# Patient Record
Sex: Female | Born: 1965 | Race: Black or African American | Hispanic: No | Marital: Single | Smoking: Never smoker
Health system: Southern US, Community
[De-identification: ages and names within clinical notes are randomized; demographics above are authoritative.]

## PROBLEM LIST (undated history)

## (undated) DIAGNOSIS — R519 Headache, unspecified: Secondary | ICD-10-CM

## (undated) DIAGNOSIS — I10 Essential (primary) hypertension: Secondary | ICD-10-CM

## (undated) DIAGNOSIS — M1990 Unspecified osteoarthritis, unspecified site: Secondary | ICD-10-CM

## (undated) DIAGNOSIS — J45909 Unspecified asthma, uncomplicated: Secondary | ICD-10-CM

## (undated) DIAGNOSIS — G039 Meningitis, unspecified: Secondary | ICD-10-CM

## (undated) DIAGNOSIS — D649 Anemia, unspecified: Secondary | ICD-10-CM

## (undated) DIAGNOSIS — R7303 Prediabetes: Secondary | ICD-10-CM

## (undated) DIAGNOSIS — A419 Sepsis, unspecified organism: Secondary | ICD-10-CM

## (undated) DIAGNOSIS — R51 Headache: Secondary | ICD-10-CM

## (undated) DIAGNOSIS — G4730 Sleep apnea, unspecified: Secondary | ICD-10-CM

## (undated) DIAGNOSIS — T7840XA Allergy, unspecified, initial encounter: Secondary | ICD-10-CM

## (undated) DIAGNOSIS — IMO0002 Reserved for concepts with insufficient information to code with codable children: Secondary | ICD-10-CM

## (undated) DIAGNOSIS — K602 Anal fissure, unspecified: Secondary | ICD-10-CM

## (undated) DIAGNOSIS — G4710 Hypersomnia, unspecified: Secondary | ICD-10-CM

## (undated) HISTORY — DX: Anal fissure, unspecified: K60.2

## (undated) HISTORY — PX: ABLATION: SHX5711

## (undated) HISTORY — PX: DILATION AND CURETTAGE OF UTERUS: SHX78

## (undated) HISTORY — DX: Meningitis, unspecified: G03.9

## (undated) HISTORY — DX: Unspecified asthma, uncomplicated: J45.909

## (undated) HISTORY — DX: Sepsis, unspecified organism: A41.9

## (undated) HISTORY — DX: Hypersomnia, unspecified: G47.10

## (undated) HISTORY — DX: Unspecified osteoarthritis, unspecified site: M19.90

## (undated) HISTORY — DX: Essential (primary) hypertension: I10

## (undated) HISTORY — PX: TUBAL LIGATION: SHX77

## (undated) HISTORY — DX: Reserved for concepts with insufficient information to code with codable children: IMO0002

## (undated) HISTORY — DX: Anemia, unspecified: D64.9

## (undated) HISTORY — DX: Headache: R51

## (undated) HISTORY — DX: Allergy, unspecified, initial encounter: T78.40XA

## (undated) HISTORY — DX: Headache, unspecified: R51.9

---

## 2010-12-25 ENCOUNTER — Emergency Department (HOSPITAL_BASED_OUTPATIENT_CLINIC_OR_DEPARTMENT_OTHER)
Admission: EM | Admit: 2010-12-25 | Discharge: 2010-12-25 | Disposition: A | Discharge status: Home / Self Care | Payer: BC Managed Care – PPO | Attending: Emergency Medicine | Admitting: Emergency Medicine

## 2010-12-25 DIAGNOSIS — J40 Bronchitis, not specified as acute or chronic: Secondary | ICD-10-CM | POA: Insufficient documentation

## 2010-12-25 DIAGNOSIS — R059 Cough, unspecified: Secondary | ICD-10-CM | POA: Insufficient documentation

## 2010-12-25 DIAGNOSIS — J45909 Unspecified asthma, uncomplicated: Secondary | ICD-10-CM | POA: Insufficient documentation

## 2010-12-25 DIAGNOSIS — R05 Cough: Secondary | ICD-10-CM | POA: Insufficient documentation

## 2011-11-22 ENCOUNTER — Telehealth: Payer: Self-pay

## 2011-11-22 ENCOUNTER — Ambulatory Visit (INDEPENDENT_AMBULATORY_CARE_PROVIDER_SITE_OTHER): Payer: BC Managed Care – PPO | Admitting: Internal Medicine

## 2011-11-22 VITALS — BP 132/84 | HR 84 | Temp 98.1°F | Resp 16 | Ht 60.5 in | Wt 137.6 lb

## 2011-11-22 DIAGNOSIS — D649 Anemia, unspecified: Secondary | ICD-10-CM

## 2011-11-22 LAB — POCT CBC
Granulocyte percent: 66.8 %G (ref 37–80)
MCV: 81.5 fL (ref 80–97)
MID (cbc): 0.3 (ref 0–0.9)
POC Granulocyte: 4.3 (ref 2–6.9)
POC LYMPH PERCENT: 28.1 %L (ref 10–50)
Platelet Count, POC: 251 10*3/uL (ref 142–424)
RDW, POC: 17.4 %

## 2011-11-22 NOTE — Progress Notes (Signed)
Lauren Paul is here for a followup of her iron deficiency anemia. She currently has no symptoms. Her last physical examination was January of 2012 at which time she was found to be healthy.  Review of systems reveals no new complaints. Physical examination shows normal vital signs with no systems to be checked.  Problem #1 iron deficiency anemia  The patient has to leave before her lab work is ready and so we will call of her lab work when the iron studies are returned.

## 2011-11-23 ENCOUNTER — Encounter: Payer: Self-pay | Admitting: Internal Medicine

## 2011-11-23 LAB — IRON AND TIBC
Iron: 39 ug/dL — ABNORMAL LOW (ref 42–145)
UIBC: 351 ug/dL (ref 125–400)

## 2011-11-24 NOTE — Telephone Encounter (Signed)
Pt. Called wanting lab results from 11/22/11. Call back # [phone]

## 2011-11-24 NOTE — Telephone Encounter (Signed)
Called patient back and left message... Dr. Merla Riches wrote letter with lab results attached.   Mailed to pt today with recommendations.  Left this detailed msg for pt.  CB if questions.

## 2011-11-25 ENCOUNTER — Encounter: Payer: Self-pay | Admitting: Internal Medicine

## 2011-11-25 ENCOUNTER — Telehealth: Payer: Self-pay

## 2011-11-25 NOTE — Telephone Encounter (Signed)
Pt called because she had gotten VM about labs sent, but she wanted to get results now. Looked up results and letter written by Dr Merla Riches and explained his instructions to pt.

## 2013-05-08 ENCOUNTER — Ambulatory Visit: Payer: BC Managed Care – PPO | Admitting: Family Medicine

## 2013-05-08 VITALS — BP 118/72 | HR 60 | Temp 98.0°F | Resp 16 | Ht 60.05 in | Wt 147.0 lb

## 2013-05-08 DIAGNOSIS — R319 Hematuria, unspecified: Secondary | ICD-10-CM

## 2013-05-08 LAB — POCT UA - MICROSCOPIC ONLY
Crystals, Ur, HPF, POC: NEGATIVE
Epithelial cells, urine per micros: NEGATIVE
Mucus, UA: NEGATIVE

## 2013-05-08 LAB — POCT URINALYSIS DIPSTICK
Ketones, UA: NEGATIVE
Nitrite, UA: POSITIVE
Spec Grav, UA: <=1.005
Urobilinogen, UA: 2
pH, UA: 5

## 2013-05-08 MED ORDER — CIPROFLOXACIN HCL 500 MG PO TABS: 500.0000 mg | ORAL_TABLET | Freq: Two times a day (BID) | ORAL | Status: DC

## 2013-05-08 NOTE — Patient Instructions (Signed)
Urinary Tract Infection  Urinary tract infections (UTIs) can develop anywhere along your urinary tract. Your urinary tract is your body's drainage system for removing wastes and extra water. Your urinary tract includes two kidneys, two ureters, a bladder, and a urethra. Your kidneys are a pair of bean-shaped organs. Each kidney is about the size of your fist. They are located below your ribs, one on each side of your spine.  CAUSES  Infections are caused by microbes, which are microscopic organisms, including fungi, viruses, and bacteria. These organisms are so small that they can only be seen through a microscope. Bacteria are the microbes that most commonly cause UTIs.  SYMPTOMS   Symptoms of UTIs may vary by age and gender of the patient and by the location of the infection. Symptoms in young women typically include a frequent and intense urge to urinate and a painful, burning feeling in the bladder or urethra during urination. Older women and men are more likely to be tired, shaky, and weak and have muscle aches and abdominal pain. A fever may mean the infection is in your kidneys. Other symptoms of a kidney infection include pain in your back or sides below the ribs, nausea, and vomiting.  DIAGNOSIS  To diagnose a UTI, your caregiver will ask you about your symptoms. Your caregiver also will ask to provide a urine sample. The urine sample will be tested for bacteria and white blood cells. White blood cells are made by your body to help fight infection.  TREATMENT   Typically, UTIs can be treated with medication. Because most UTIs are caused by a bacterial infection, they usually can be treated with the use of antibiotics. The choice of antibiotic and length of treatment depend on your symptoms and the type of bacteria causing your infection.  HOME CARE INSTRUCTIONS   If you were prescribed antibiotics, take them exactly as your caregiver instructs you. Finish the medication even if you feel better after you  have only taken some of the medication.   Drink enough water and fluids to keep your urine clear or pale yellow.   Avoid caffeine, tea, and carbonated beverages. They tend to irritate your bladder.   Empty your bladder often. Avoid holding urine for long periods of time.   Empty your bladder before and after sexual intercourse.   After a bowel movement, women should cleanse from front to back. Use each tissue only once.  SEEK MEDICAL CARE IF:    You have back pain.   You develop a fever.   Your symptoms do not begin to resolve within 3 days.  SEEK IMMEDIATE MEDICAL CARE IF:    You have severe back pain or lower abdominal pain.   You develop chills.   You have nausea or vomiting.   You have continued burning or discomfort with urination.  MAKE SURE YOU:    Understand these instructions.   Will watch your condition.   Will get help right away if you are not doing well or get worse.  Document Released: 07/17/2005 Document Revised: 04/07/2012 Document Reviewed: 11/15/2011  Exit[employer] Patient Information 2014 [employer].

## 2013-05-08 NOTE — Progress Notes (Signed)
    Results for orders placed in visit on 05/08/13  POCT UA - MICROSCOPIC ONLY      Result Value Range   WBC, Ur, HPF, POC 3-5     RBC, urine, microscopic 1-3     Bacteria, U Microscopic 1+     Mucus, UA neg     Epithelial cells, urine per micros neg     Crystals, Ur, HPF, POC neg     Casts, Ur, LPF, POC neg     Yeast, UA neg    POCT URINALYSIS DIPSTICK      Result Value Range   Color, UA red     Clarity, UA clear     Glucose, UA 100     Bilirubin, UA neg     Ketones, UA neg     Spec Grav, UA <=1.005     Blood, UA large     pH, UA 5.0     Protein, UA 100     Urobilinogen, UA 2.0     Nitrite, UA pos     Leukocytes, UA large (3+)

## 2013-05-11 LAB — URINE CULTURE: Colony Count: >=100000

## 2013-05-17 ENCOUNTER — Ambulatory Visit: Payer: BC Managed Care – PPO | Admitting: Emergency Medicine

## 2013-05-17 VITALS — BP 128/88 | HR 63 | Temp 98.3°F | Resp 18 | Ht 60.0 in | Wt 144.2 lb

## 2013-05-17 DIAGNOSIS — B029 Zoster without complications: Secondary | ICD-10-CM

## 2013-05-17 DIAGNOSIS — R109 Unspecified abdominal pain: Secondary | ICD-10-CM

## 2013-05-17 DIAGNOSIS — C189 Malignant neoplasm of colon, unspecified: Secondary | ICD-10-CM

## 2013-05-17 LAB — POCT URINALYSIS DIPSTICK
Bilirubin, UA: NEGATIVE
Blood, UA: NEGATIVE
Glucose, UA: NEGATIVE
Ketones, UA: NEGATIVE
Leukocytes, UA: NEGATIVE
Nitrite, UA: NEGATIVE
Protein, UA: NEGATIVE
Spec Grav, UA: 1.025
Urobilinogen, UA: 0.2
pH, UA: 7

## 2013-05-17 LAB — POCT UA - MICROSCOPIC ONLY
Casts, Ur, LPF, POC: NEGATIVE
Crystals, Ur, HPF, POC: NEGATIVE
Mucus, UA: NEGATIVE
Yeast, UA: NEGATIVE

## 2013-05-17 MED ORDER — VALACYCLOVIR HCL 1 G PO TABS: 1000.0000 mg | ORAL_TABLET | Freq: Two times a day (BID) | ORAL | Status: DC

## 2013-05-17 MED ORDER — HYDROCODONE-ACETAMINOPHEN 5-325 MG PO TABS: 1.0000 | ORAL_TABLET | ORAL | Status: DC | PRN

## 2013-05-17 NOTE — Patient Instructions (Addendum)
Shingles Shingles (herpes zoster) is an infection that is caused by the same virus that causes chickenpox (varicella). The infection causes a painful skin rash and fluid-filled blisters, which eventually break open, crust over, and heal. It may occur in any area of the body, but it usually affects only one side of the body or face. The pain of shingles usually lasts about 1 month. However, some people with shingles may develop long-term (chronic) pain in the affected area of the body. Shingles often occurs many years after the person had chickenpox. It is more common:  In people older than 50 years.  In people with weakened immune systems, such as those with HIV, AIDS, or cancer.  In people taking medicines that weaken the immune system, such as transplant medicines.  In people under great stress. CAUSES  Shingles is caused by the varicella zoster virus (VZV), which also causes chickenpox. After a person is infected with the virus, it can remain in the person's body for years in an inactive state (dormant). To cause shingles, the virus reactivates and breaks out as an infection in a nerve root. The virus can be spread from person to person (contagious) through contact with open blisters of the shingles rash. It will only spread to people who have not had chickenpox. When these people are exposed to the virus, they may develop chickenpox. They will not develop shingles. Once the blisters scab over, the person is no longer contagious and cannot spread the virus to others. SYMPTOMS  Shingles shows up in stages. The initial symptoms may be pain, itching, and tingling in an area of the skin. This pain is usually described as burning, stabbing, or throbbing.In a few days or weeks, a painful red rash will appear in the area where the pain, itching, and tingling were felt. The rash is usually on one side of the body in a band or belt-like pattern. Then, the rash usually turns into fluid-filled blisters. They  will scab over and dry up in approximately 2 3 weeks. Flu-like symptoms may also occur with the initial symptoms, the rash, or the blisters. These may include:  Fever.  Chills.  Headache.  Upset stomach. DIAGNOSIS  Your caregiver will perform a skin exam to diagnose shingles. Skin scrapings or fluid samples may also be taken from the blisters. This sample will be examined under a microscope or sent to a lab for further testing. TREATMENT  There is no specific cure for shingles. Your caregiver will likely prescribe medicines to help you manage the pain, recover faster, and avoid long-term problems. This may include antiviral drugs, anti-inflammatory drugs, and pain medicines. HOME CARE INSTRUCTIONS   Take a cool bath or apply cool compresses to the area of the rash or blisters as directed. This may help with the pain and itching.   Only take over-the-counter or prescription medicines as directed by your caregiver.   Rest as directed by your caregiver.  Keep your rash and blisters clean with mild soap and cool water or as directed by your caregiver.  Do not pick your blisters or scratch your rash. Apply an anti-itch cream or numbing creams to the affected area as directed by your caregiver.  Keep your shingles rash covered with a loose bandage (dressing).  Avoid skin contact with:  Babies.   Pregnant women.   Children with eczema.   Elderly people with transplants.   People with chronic illnesses, such as leukemia or AIDS.   Wear loose-fitting clothing to help ease   the pain of material rubbing against the rash.  Keep all follow-up appointments with your caregiver.If the area involved is on your face, you may receive a referral for follow-up to a specialist, such as an eye doctor (ophthalmologist) or an ear, nose, and throat (ENT) doctor. Keeping all follow-up appointments will help you avoid eye complications, chronic pain, or disability.  SEEK IMMEDIATE MEDICAL  CARE IF:   You have facial pain, pain around the eye area, or loss of feeling on one side of your face.  You have ear pain or ringing in your ear.  You have loss of taste.  Your pain is not relieved with prescribed medicines.   Your redness or swelling spreads.   You have more pain and swelling.  Your condition is worsening or has changed.   You have a feveror persistent symptoms for more than 2 3 days.  You have a fever and your symptoms suddenly get worse. MAKE SURE YOU:  Understand these instructions.  Will watch your condition.  Will get help right away if you are not doing well or get worse. Document Released: 10/07/2005 Document Revised: 07/01/2012 Document Reviewed: 05/21/2012 [employer] Patient Information 2014 [employer].  

## 2013-05-17 NOTE — Progress Notes (Signed)
[facility] [address] [place] [address] [phone]  Date:  05/17/2013   Name:  Lauren Paul   DOB:  06-09-1966   MRN:  [id number]  PCP:  No primary provider on file.    Chief Complaint: Back Pain   History of Present Illness:  Lauren Paul is a 47 y.o. very pleasant female patient who presents with the following:  Patient was in the office on 7/19 with a UTI sensitive to cipro by culture.  She completed her course of antibiotic and now has persistent dysuria and right flank pain.  Constant dull aching pain.  No GYN or GI symptoms.  No fever but chilled feeling.  Ho blood in urine.  No improvement with over the counter medications or other home remedies. Denies other complaint or health concern today.   There are no active problems to display for this patient.   Past Medical History  Diagnosis Date  . Allergy   . Anemia   . Asthma   . Ulcer   . Meningitis     History reviewed. No pertinent past surgical history.  History  Substance Use Topics  . Smoking status: Never Smoker   . Smokeless tobacco: Not on file  . Alcohol Use: 1.0 oz/week    2 drink(s) per week    Family History  Problem Relation Age of Onset  . Cancer Mother 15    Bladder Cancer  . Cancer Father     Lung Cancer    Allergies  Allergen Reactions  . Benadryl (Diphenhydramine Hcl) Shortness Of Breath    Pt states she can take Childrens Benadryl dye free fine.  . Latex Swelling  . Penicillins Anaphylaxis    Pt states she can not take anything in the "cillin" family.  . Shellfish Allergy     Medication list has been reviewed and updated.  Current Outpatient Prescriptions on File Prior to Visit  Medication Sig Dispense Refill  . Multiple Vitamin (MULTIVITAMIN) capsule Take 1 capsule by mouth daily.      . ciprofloxacin (CIPRO) 500 MG tablet Take 1 tablet (500 mg total) by mouth 2 (two) times daily.  10 tablet  1  . escitalopram (LEXAPRO) 10 MG tablet Take 10 mg by  mouth daily.      . ferrous sulfate dried (SLOW FE) 160 (50 FE) MG TBCR Take 160 mg by mouth daily.       No current facility-administered medications on file prior to visit.    Review of Systems:  As per HPI, otherwise negative.    Physical Examination: Filed Vitals:   05/17/13 1501  BP: 128/88  Pulse: 63  Temp: 98.3 F (36.8 C)  Resp: 18   Filed Vitals:   05/17/13 1501  Height: 5' (1.524 m)  Weight: 144 lb 3.2 oz (65.409 kg)   Body mass index is 28.16 kg/(m^2). Ideal Body Weight: Weight in (lb) to have BMI = 25: 127.7  GEN: WDWN, NAD, Non-toxic, A & O x 3 HEENT: Atraumatic, Normocephalic. Neck supple. No masses, No LAD. Ears and Nose: No external deformity. CV: RRR, No M/G/R. No JVD. No thrill. No extra heart sounds. PULM: CTA B, no wheezes, crackles, rhonchi. No retractions. No resp. distress. No accessory muscle use. ABD: S, NT, ND, +BS. No rebound. No HSM. EXTR: No c/c/e NEURO Normal gait.  PSYCH: Normally interactive. Conversant. Not depressed or anxious appearing.  Calm demeanor.    Assessment and Plan: Back pain Shingles vicodin Valtrex  Signed,  Phillips Odor, MD   Results for orders placed in visit on 05/17/13  POCT URINALYSIS DIPSTICK      Result Value Range   Color, UA yellow     Clarity, UA cloudy     Glucose, UA neg     Bilirubin, UA neg     Ketones, UA neg     Spec Grav, UA 1.025     Blood, UA neg     pH, UA 7.0     Protein, UA neg     Urobilinogen, UA 0.2     Nitrite, UA neg     Leukocytes, UA Negative    POCT UA - MICROSCOPIC ONLY      Result Value Range   WBC, Ur, HPF, POC 0-1     RBC, urine, microscopic 0-1     Bacteria, U Microscopic trace     Mucus, UA neg     Epithelial cells, urine per micros 0-2     Crystals, Ur, HPF, POC neg     Casts, Ur, LPF, POC neg     Yeast, UA neg

## 2014-05-03 ENCOUNTER — Ambulatory Visit (INDEPENDENT_AMBULATORY_CARE_PROVIDER_SITE_OTHER): Payer: BC Managed Care – PPO | Admitting: Internal Medicine

## 2014-05-03 VITALS — BP 186/110 | HR 59 | Temp 98.0°F | Resp 18 | Ht 60.0 in | Wt 150.4 lb

## 2014-05-03 DIAGNOSIS — I10 Essential (primary) hypertension: Secondary | ICD-10-CM

## 2014-05-03 DIAGNOSIS — Z79899 Other long term (current) drug therapy: Secondary | ICD-10-CM

## 2014-05-03 LAB — POCT UA - MICROSCOPIC ONLY
CASTS, UR, LPF, POC: NEGATIVE
CRYSTALS, UR, HPF, POC: NEGATIVE
YEAST UA: NEGATIVE

## 2014-05-03 LAB — POCT URINALYSIS DIPSTICK
Bilirubin, UA: NEGATIVE
Blood, UA: NEGATIVE
Glucose, UA: NEGATIVE
KETONES UA: NEGATIVE
LEUKOCYTES UA: NEGATIVE
Nitrite, UA: NEGATIVE
PH UA: 7
PROTEIN UA: NEGATIVE
Spec Grav, UA: 1.015
UROBILINOGEN UA: 0.2

## 2014-05-03 MED ORDER — AMLODIPINE BESYLATE 10 MG PO TABS: 10.0000 mg | ORAL_TABLET | Freq: Every day | ORAL | Status: DC

## 2014-05-03 NOTE — Patient Instructions (Signed)
DASH Eating Plan DASH stands for "Dietary Approaches to Stop Hypertension." The DASH eating plan is a healthy eating plan that has been shown to reduce high blood pressure (hypertension). Additional health benefits may include reducing the risk of type 2 diabetes mellitus, heart disease, and stroke. The DASH eating plan may also help with weight loss. WHAT DO I NEED TO KNOW ABOUT THE DASH EATING PLAN? For the DASH eating plan, you will follow these general guidelines:  Choose foods with a percent daily value for sodium of less than 5% (as listed on the food label).  Use salt-free seasonings or herbs instead of table salt or sea salt.  Check with your health care provider or pharmacist before using salt substitutes.  Eat lower-sodium products, often labeled as "lower sodium" or "no salt added."  Eat fresh foods.  Eat more vegetables, fruits, and low-fat dairy products.  Choose whole grains. Look for the word "whole" as the first word in the ingredient list.  Choose fish and skinless chicken or turkey more often than red meat. Limit fish, poultry, and meat to 6 oz (170 g) each day.  Limit sweets, desserts, sugars, and sugary drinks.  Choose heart-healthy fats.  Limit cheese to 1 oz (28 g) per day.  Eat more home-cooked food and less restaurant, buffet, and fast food.  Limit fried foods.  Cook foods using methods other than frying.  Limit canned vegetables. If you do use them, rinse them well to decrease the sodium.  When eating at a restaurant, ask that your food be prepared with less salt, or no salt if possible. WHAT FOODS CAN I EAT? Seek help from a dietitian for individual calorie needs. Grains Whole grain or whole wheat bread. Brown rice. Whole grain or whole wheat pasta. Quinoa, bulgur, and whole grain cereals. Low-sodium cereals. Corn or whole wheat flour tortillas. Whole grain cornbread. Whole grain crackers. Low-sodium crackers. Vegetables Fresh or frozen vegetables  (raw, steamed, roasted, or grilled). Low-sodium or reduced-sodium tomato and vegetable juices. Low-sodium or reduced-sodium tomato sauce and paste. Low-sodium or reduced-sodium canned vegetables.  Fruits All fresh, canned (in natural juice), or frozen fruits. Meat and Other Protein Products Ground beef (85% or leaner), grass-fed beef, or beef trimmed of fat. Skinless chicken or turkey. Ground chicken or turkey. Pork trimmed of fat. All fish and seafood. Eggs. Dried beans, peas, or lentils. Unsalted nuts and seeds. Unsalted canned beans. Dairy Low-fat dairy products, such as skim or 1% milk, 2% or reduced-fat cheeses, low-fat ricotta or cottage cheese, or plain low-fat yogurt. Low-sodium or reduced-sodium cheeses. Fats and Oils Tub margarines without trans fats. Light or reduced-fat mayonnaise and salad dressings (reduced sodium). Avocado. Safflower, olive, or canola oils. Natural peanut or almond butter. Other Unsalted popcorn and pretzels. The items listed above may not be a complete list of recommended foods or beverages. Contact your dietitian for more options. WHAT FOODS ARE NOT RECOMMENDED? Grains White bread. White pasta. White rice. Refined cornbread. Bagels and croissants. Crackers that contain trans fat. Vegetables Creamed or fried vegetables. Vegetables in a cheese sauce. Regular canned vegetables. Regular canned tomato sauce and paste. Regular tomato and vegetable juices. Fruits Dried fruits. Canned fruit in light or heavy syrup. Fruit juice. Meat and Other Protein Products Fatty cuts of meat. Ribs, chicken wings, bacon, sausage, bologna, salami, chitterlings, fatback, hot dogs, bratwurst, and packaged luncheon meats. Salted nuts and seeds. Canned beans with salt. Dairy Whole or 2% milk, cream, half-and-half, and cream cheese. Whole-fat or sweetened yogurt. Full-fat   cheeses or blue cheese. Nondairy creamers and whipped toppings. Processed cheese, cheese spreads, or cheese  curds. Condiments Onion and garlic salt, seasoned salt, table salt, and sea salt. Canned and packaged gravies. Worcestershire sauce. Tartar sauce. Barbecue sauce. Teriyaki sauce. Soy sauce, including reduced sodium. Steak sauce. Fish sauce. Oyster sauce. Cocktail sauce. Horseradish. Ketchup and mustard. Meat flavorings and tenderizers. Bouillon cubes. Hot sauce. Tabasco sauce. Marinades. Taco seasonings. Relishes. Fats and Oils Butter, stick margarine, lard, shortening, ghee, and bacon fat. Coconut, palm kernel, or palm oils. Regular salad dressings. Other Pickles and olives. Salted popcorn and pretzels. The items listed above may not be a complete list of foods and beverages to avoid. Contact your dietitian for more information. WHERE CAN I FIND MORE INFORMATION? National Heart, Lung, and Blood Institute: travelstabloid.com Document Released: 09/26/2011 Document Revised: 10/12/2013 Document Reviewed: 08/11/2013 [facility] Patient Information 2015 [place], [place]. This information is not intended to replace advice given to you by your health care provider. Make sure you discuss any questions you have with your health care provider. Hypertension Hypertension, commonly called high blood pressure, is when the force of blood pumping through your arteries is too strong. Your arteries are the blood vessels that carry blood from your heart throughout your body. A blood pressure reading consists of a higher number over a lower number, such as 110/72. The higher number (systolic) is the pressure inside your arteries when your heart pumps. The lower number (diastolic) is the pressure inside your arteries when your heart relaxes. Ideally you want your blood pressure below 120/80. Hypertension forces your heart to work harder to pump blood. Your arteries may become narrow or stiff. Having hypertension puts you at risk for heart disease, stroke, and other problems.  RISK  FACTORS Some risk factors for high blood pressure are controllable. Others are not.  Risk factors you cannot control include:   Race. You may be at higher risk if you are African American.  Age. Risk increases with age.  Gender. Men are at higher risk than women before age 46 years. After age 78, women are at higher risk than men. Risk factors you can control include:  Not getting enough exercise or physical activity.  Being overweight.  Getting too much fat, sugar, calories, or salt in your diet.  Drinking too much alcohol. SIGNS AND SYMPTOMS Hypertension does not usually cause signs or symptoms. Extremely high blood pressure (hypertensive crisis) may cause headache, anxiety, shortness of breath, and nosebleed. DIAGNOSIS  To check if you have hypertension, your health care provider will measure your blood pressure while you are seated, with your arm held at the level of your heart. It should be measured at least twice using the same arm. Certain conditions can cause a difference in blood pressure between your right and left arms. A blood pressure reading that is higher than normal on one occasion does not mean that you need treatment. If one blood pressure reading is high, ask your health care provider about having it checked again. TREATMENT  Treating high blood pressure includes making lifestyle changes and possibly taking medication. Living a healthy lifestyle can help lower high blood pressure. You may need to change some of your habits. Lifestyle changes may include:  Following the DASH diet. This diet is high in fruits, vegetables, and whole grains. It is low in salt, red meat, and added sugars.  Getting at least 2 1/2 hours of brisk physical activity every week.  Losing weight if necessary.  Not smoking.  Limiting alcoholic beverages.  Learning ways to reduce stress. If lifestyle changes are not enough to get your blood pressure under control, your health care provider  may prescribe medicine. You may need to take more than one. Work closely with your health care provider to understand the risks and benefits. HOME CARE INSTRUCTIONS  Have your blood pressure rechecked as directed by your health care provider.   Only take medicine as directed by your health care provider. Follow the directions carefully. Blood pressure medicines must be taken as prescribed. The medicine does not work as well when you skip doses. Skipping doses also puts you at risk for problems.   Do not smoke.   Monitor your blood pressure at home as directed by your health care provider. SEEK MEDICAL CARE IF:   You think you are having a reaction to medicines taken.  You have recurrent headaches or feel dizzy.  You have swelling in your ankles.  You have trouble with your vision. SEEK IMMEDIATE MEDICAL CARE IF:  You develop a severe headache or confusion.  You have unusual weakness, numbness, or feel faint.  You have severe chest or abdominal pain.  You vomit repeatedly.  You have trouble breathing. MAKE SURE YOU:   Understand these instructions.  Will watch your condition.  Will get help right away if you are not doing well or get worse. Document Released: 10/07/2005 Document Revised: 10/12/2013 Document Reviewed: 07/30/2013 [facility] Patient Information 2015 [place], [place]. This information is not intended to replace advice given to you by your health care provider. Make sure you discuss any questions you have with your health care provider.

## 2014-05-03 NOTE — Progress Notes (Signed)
   Subjective:    Patient ID: Lauren Paul, female    DOB: 05/12/1966, 48 y.o.   MRN: [id number]  HPI    Review of Systems     Objective:   Physical Exam        Assessment & Plan:

## 2014-05-03 NOTE — Progress Notes (Signed)
   Subjective:    Patient ID: Lauren Paul, female    DOB: September 07, 1966, 48 y.o.   MRN: [id number]  HPI Patient here because she had an outpatient procedure 02/13/14 ( tubal ligation, DNC, and ablation )and got sepsis during procedure.  Patient was last seen by ID in May she believes.  ID doctor said the blood work was clear and it showed no infection at that time.  Patient because lethargic in the past few weeks.  Patient's left arm is still hurting from where she had a pic line in place for IV antibiotics.  Patient states she has headaches off and on for the past few weeks.  Pt states she has had elevated blood pressure since she was in hospital starting 02/19/14.  She doesn't recall ever having HTN problems in the past.  Patient states ED doctor did give her BP medication but pt did not take it because she didn't believe she needed it at that time.  She was in [facility] hospital and has no recoerds or reports with her.   Review of Systems     Objective:   Physical Exam  Vitals reviewed. Constitutional: She is oriented to person, place, and time. She appears well-developed and well-nourished. No distress.  HENT:  Head: Normocephalic.  Eyes: EOM are normal. Pupils are equal, round, and reactive to light.  Cardiovascular: Normal rate, regular rhythm and normal heart sounds.   No murmur heard. Pulmonary/Chest: Effort normal and breath sounds normal.  Musculoskeletal: Normal range of motion.  Neurological: She is alert and oriented to person, place, and time. She exhibits normal muscle tone. Coordination normal.  Psychiatric: She has a normal mood and affect. Her behavior is normal.     EKG normal labs Results for orders placed in visit on 05/03/14  POCT UA - MICROSCOPIC ONLY      Result Value Ref Range   WBC, Ur, HPF, POC 0-2     RBC, urine, microscopic 1-3     Bacteria, U Microscopic trace     Mucus, UA trace     Epithelial cells, urine per micros 2-4     Crystals, Ur, HPF, POC neg     Casts, Ur, LPF, POC neg     Yeast, UA neg    POCT URINALYSIS DIPSTICK      Result Value Ref Range   Color, UA yellow     Clarity, UA clear     Glucose, UA neg     Bilirubin, UA neg     Ketones, UA neg     Spec Grav, UA 1.015     Blood, UA neg     pH, UA 7.0     Protein, UA neg     Urobilinogen, UA 0.2     Nitrite, UA neg     Leukocytes, UA Negative         Assessment & Plan:  HTN untreated Sepsis post surgery in April 2015 followed by new HTN. Start Amlodipine 10mg  qd RTC 1 week/Consider add another med

## 2014-05-04 LAB — COMPREHENSIVE METABOLIC PANEL
ALBUMIN: 4.2 g/dL (ref 3.5–5.2)
ALK PHOS: 67 U/L (ref 39–117)
ALT: 13 U/L (ref 0–35)
AST: 20 U/L (ref 0–37)
BILIRUBIN TOTAL: 0.7 mg/dL (ref 0.2–1.2)
BUN: 11 mg/dL (ref 6–23)
CO2: 24 mEq/L (ref 19–32)
CREATININE: 0.73 mg/dL (ref 0.50–1.10)
Calcium: 9.2 mg/dL (ref 8.4–10.5)
Chloride: 104 mEq/L (ref 96–112)
Glucose, Bld: 81 mg/dL (ref 70–99)
Potassium: 3.7 mEq/L (ref 3.5–5.3)
SODIUM: 137 meq/L (ref 135–145)
Total Protein: 7.4 g/dL (ref 6.0–8.3)

## 2014-05-04 LAB — LIPID PANEL
CHOL/HDL RATIO: 2.5 ratio
CHOLESTEROL: 175 mg/dL (ref 0–200)
HDL: 69 mg/dL (ref >39)
LDL Cholesterol: 93 mg/dL (ref 0–99)
Triglycerides: 63 mg/dL (ref <150)
VLDL: 13 mg/dL (ref 0–40)

## 2014-05-04 LAB — CBC
HCT: 37.7 % (ref 36.0–46.0)
HEMOGLOBIN: 12.6 g/dL (ref 12.0–15.0)
MCH: 27.5 pg (ref 26.0–34.0)
MCHC: 33.4 g/dL (ref 30.0–36.0)
MCV: 82.3 fL (ref 78.0–100.0)
PLATELETS: 281 10*3/uL (ref 150–400)
RBC: 4.58 MIL/uL (ref 3.87–5.11)
RDW: 15.5 % (ref 11.5–15.5)
WBC: 6.9 10*3/uL (ref 4.0–10.5)

## 2014-05-04 LAB — TSH: TSH: 1.71 u[IU]/mL (ref 0.350–4.500)

## 2014-05-05 ENCOUNTER — Telehealth: Payer: Self-pay | Admitting: Radiology

## 2014-05-05 NOTE — Telephone Encounter (Signed)
Pt calling about labs but labs have not been reviewed yet. Please review so we can let pt know.

## 2014-05-06 ENCOUNTER — Encounter: Payer: Self-pay | Admitting: Radiology

## 2014-05-06 NOTE — Telephone Encounter (Signed)
All labs normal send copy

## 2014-05-07 NOTE — Telephone Encounter (Signed)
LMOM that labs were normal and a letter was sent.

## 2014-05-11 ENCOUNTER — Ambulatory Visit (INDEPENDENT_AMBULATORY_CARE_PROVIDER_SITE_OTHER): Payer: BC Managed Care – PPO | Admitting: Family Medicine

## 2014-05-11 ENCOUNTER — Encounter: Payer: Self-pay | Admitting: Family Medicine

## 2014-05-11 VITALS — BP 122/86 | HR 61 | Temp 97.7°F | Resp 16 | Ht 60.0 in | Wt 148.8 lb

## 2014-05-11 DIAGNOSIS — I10 Essential (primary) hypertension: Secondary | ICD-10-CM

## 2014-05-11 DIAGNOSIS — R5381 Other malaise: Secondary | ICD-10-CM

## 2014-05-11 DIAGNOSIS — A419 Sepsis, unspecified organism: Secondary | ICD-10-CM

## 2014-05-11 DIAGNOSIS — Z23 Encounter for immunization: Secondary | ICD-10-CM

## 2014-05-11 DIAGNOSIS — R5383 Other fatigue: Secondary | ICD-10-CM

## 2014-05-11 LAB — IRON: Iron: 64 ug/dL (ref 42–145)

## 2014-05-11 LAB — VITAMIN B12: Vitamin B-12: >2000 pg/mL — ABNORMAL HIGH (ref 211–911)

## 2014-05-11 NOTE — Progress Notes (Signed)
Subjective:  This chart was scribed for Reginia Forts, MD by Roxan Diesel, Scribe.  This patient was seen in [facility] and the patient's care was started at 8:27 AM.   Patient ID: Lauren Paul, female    DOB: 09/30/1966, 48 y.o.   MRN: [id number]  05/11/2014  Follow-up and Hypertension   HPI  HPI Comments: Lauren Paul is a 48 y.o. female former pt of Dr. Elder Cyphers who presents to [facility] for a 1 week f/u of HTN.  She was seen here on 7/17 with extremely elevated BP at 186/110.  Dr. Elder Cyphers started her on amlodipine 10mg  daily.  He also performed a CBC, CMET, lipid, TSH, and UA and all were normal.  She has been taking the amlodipine and has had readings of 116/73 at home.  She noticed blurred vision for a couple days after she began the medication but this has since resolved.  She denies dizziness, headache, leg swelling.  Pt initially found out that her BP was elevated when she was hospitalized for sepsis at [facility] in May.  She had otherwise had no prior issues with HTN and attributed it to her illness at that time.  Before this time it was normally under the 120s.  She was experiencing lethargy, low appetite, and feeling generally unwell since her discharge so she came here to get checked last week.  She continues to experience multiple symptoms which initially began after her hospitalization and which she attributes to her recent sepsis.  She states she continues to feel fatigued.  She complains of intermittent chest pain which usually comes on when sitting.  She also feels some occasional palpitations when she is sleeping or laying down.  She also repots SOB when going up stairs which is new for her, and muscle cramping in her legs. She had V-Tach while hospitalized and saw a cardiologist there who did an echo; it was felt that the V-tach was secondary to PICC line insertion into the heart.  She was advised to follow up with a cardiologist after discharge but she has not yet done  this.  She denies leg swelling.  She was having headaches but has had no further headaches since starting on the amlodipine.  Her mother is living and is 47 years old.  Mother has h/o bladder cancer and uterine cancer, s/p hysterectomy, but is otherwise well.  Father died at age 12 of lung cancer.  He had no other health problems.  Her sister is living and has HTN but is otherwise healthy.  Her brother is healthy.  Her aunt had brain cancer and lung cancer and died last year.  She has no family h/o colon cancer.   She is single and is not dating.  She does not have children.  She lives alone with a cat.  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  She is taking Lexapro for her menopausal symptoms, which works if she takes it along with [employer].  She is not on iron.  She has been on Lexapro for a year and feels it is causing her to gain weight.  She has never smoked.  She drinks about 2 glasses of alcohol per week.  She has not been exercising much lately due to her fatigue.  She is taking vitamin D3.  She has also been taking a multivitamin and B12 for years.  OB/GYN is a PA at [employer].  She had pap smear this year  and mammogram last year.  She has not had a colonoscopy.  Tetanus was over 10 years ago.  She does not get flu shots.   Review of Systems  Constitutional: Positive for fatigue. Negative for fever, chills, diaphoresis and unexpected weight change.  Eyes: Positive for visual disturbance (resolved).  Respiratory: Positive for shortness of breath. Negative for cough, wheezing and stridor.   Cardiovascular: Positive for chest pain and palpitations. Negative for leg swelling.  Gastrointestinal: Negative for nausea, vomiting, abdominal pain, diarrhea and constipation.  Musculoskeletal: Positive for myalgias.  Neurological: Negative for dizziness, tremors, seizures, syncope, facial asymmetry, speech difficulty, weakness, light-headedness, numbness and  headaches (not since last visit).     Past Medical History  Diagnosis Date  . Allergy   . Anemia   . Asthma   . Ulcer   . Meningitis   . Sepsis   . Hypertension    Past Surgical History  Procedure Laterality Date  . Ablation    . Dilation and curettage of uterus    . Tubes tied    . Tubal ligation     Allergies  Allergen Reactions  . Benadryl [Diphenhydramine Hcl] Shortness Of Breath    Pt states she can take Childrens Benadryl dye free fine.  . Latex Swelling  . Penicillins Anaphylaxis    Pt states she can not take anything in the "cillin" family.  . Shellfish Allergy    Current Outpatient Prescriptions  Medication Sig Dispense Refill  . amLODipine (NORVASC) 10 MG tablet Take 1 tablet (10 mg total) by mouth daily.  90 tablet  3  . escitalopram (LEXAPRO) 10 MG tablet Take 10 mg by mouth daily.      . Multiple Vitamin (MULTIVITAMIN) capsule Take 1 capsule by mouth daily.      Marland Kitchen OVER THE COUNTER MEDICATION daily.       No current facility-administered medications for this visit.   History   Social History  . Marital Status: Single    Spouse Name: N/A    Number of Children: N/A  . Years of Education: N/A   Occupational History  . Not on file.   Social History Main Topics  . Smoking status: Never Smoker   . Smokeless tobacco: Not on file  . Alcohol Use: 1.0 oz/week    2 drink(s) per week  . Drug Use: Not on file  . Sexual Activity: Not on file   Other Topics Concern  . Not on file   Social History Narrative   Marital status:  Single; not dating.      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      Tobacco:  None      Alcohol:  Weekends; 2 glasses of wine weekly      Exercise:  Sporadic.    Family History  Problem Relation Age of Onset  . Cancer Mother 35    Bladder Cancer; Uterine cancer  . Cancer Father     Lung Cancer  . Hypertension Sister           Objective:    BP 122/86  Pulse 61  Temp(Src) 97.7 F (36.5 C) (Oral)  Resp 16  Ht 5' (1.524 m)  Wt 148 lb 12.8 oz (67.495 kg)  BMI 29.06 kg/m2  SpO2 100%  Physical Exam  Nursing note and vitals reviewed. Constitutional: She is oriented to person, place,  and time. She appears well-developed and well-nourished. No distress.  HENT:  Head: Normocephalic and atraumatic.  Right Ear: Tympanic membrane and external ear normal.  Left Ear: Tympanic membrane and external ear normal.  Nose: Nose normal.  Mouth/Throat: Oropharynx is clear and moist. No oropharyngeal exudate.  Eyes: Conjunctivae and EOM are normal. Pupils are equal, round, and reactive to light.  Neck: Normal range of motion. Neck supple. Carotid bruit is not present. No tracheal deviation present. No thyromegaly present.  Cardiovascular: Normal rate, regular rhythm, normal heart sounds and intact distal pulses.  Exam reveals no gallop and no friction rub.   No murmur heard. Pulmonary/Chest: Effort normal and breath sounds normal. No respiratory distress. She has no wheezes. She has no rales.  Abdominal: Soft. Bowel sounds are normal. She exhibits no distension and no mass. There is no tenderness. There is no rebound and no guarding.  Musculoskeletal: Normal range of motion.  Lymphadenopathy:    She has no cervical adenopathy.  Neurological: She is alert and oriented to person, place, and time. No cranial nerve deficit.  Skin: Skin is warm and dry. No rash noted. She is not diaphoretic. No erythema. No pallor.  Psychiatric: She has a normal mood and affect. Her behavior is normal.   Results for orders placed in visit on 05/11/14  VITAMIN B12      Result Value Ref Range   Vitamin B-12 >2000 (*) 211 - 911 pg/mL  VITAMIN D 25 HYDROXY      Result Value Ref Range   Vit D, 25-Hydroxy 62  30 - 89 ng/mL  IRON      Result Value Ref Range   Iron 64  42 - 145 ug/dL   S/p TDAP in office.    Assessment & Plan:   1. Other  malaise and fatigue   2. Essential hypertension, benign   3. Sepsis, due to unspecified organism   4. Need for Tdap vaccination    1.  Malaise and fatigue: persistent; recent CBC, CMET, TSH, u/a all normal; obtain Vitamin B12, D, iron levels and normal.  Likely secondary to recent sepsis and hospitalization. Pt advised to contact cardiology for follow-up appointment to rule out cardiac cause of fatigue.  If persists, consider sleep study. 2.  HTN: improved control; no change in therapy at this time; follow-up in one month.   3.  Sepsis: resolved; obtain records; onset after D&C with ablation for DUB.   4. S/p TDAP in office.  Meds ordered this encounter  Medications  . OVER THE COUNTER MEDICATION    Sig: daily.    Return in about 4 weeks (around 06/08/2014) for recheck high blood pressure, fatigue.    I personally performed the services described in this documentation, which was scribed in my presence.  The recorded information has been reviewed and is accurate.  Reginia Forts, M.D.  [facility] [address] [place], [place]  [address] [phone] phone [phone] fax

## 2014-05-12 LAB — VITAMIN D 25 HYDROXY (VIT D DEFICIENCY, FRACTURES): Vit D, 25-Hydroxy: 62 ng/mL (ref 30–89)

## 2014-06-01 ENCOUNTER — Ambulatory Visit: Payer: BC Managed Care – PPO | Admitting: Family Medicine

## 2014-06-06 ENCOUNTER — Ambulatory Visit (INDEPENDENT_AMBULATORY_CARE_PROVIDER_SITE_OTHER): Payer: BC Managed Care – PPO | Admitting: Family Medicine

## 2014-06-06 ENCOUNTER — Encounter: Payer: Self-pay | Admitting: Family Medicine

## 2014-06-06 VITALS — BP 112/76 | HR 73 | Temp 98.2°F | Resp 16 | Ht 60.5 in | Wt 152.4 lb

## 2014-06-06 DIAGNOSIS — G4710 Hypersomnia, unspecified: Secondary | ICD-10-CM

## 2014-06-06 DIAGNOSIS — R238 Other skin changes: Secondary | ICD-10-CM

## 2014-06-06 DIAGNOSIS — M2550 Pain in unspecified joint: Secondary | ICD-10-CM

## 2014-06-06 DIAGNOSIS — R233 Spontaneous ecchymoses: Secondary | ICD-10-CM

## 2014-06-06 DIAGNOSIS — M7989 Other specified soft tissue disorders: Secondary | ICD-10-CM

## 2014-06-06 DIAGNOSIS — I10 Essential (primary) hypertension: Secondary | ICD-10-CM

## 2014-06-06 MED ORDER — HYDROCHLOROTHIAZIDE 12.5 MG PO TABS: 12.5000 mg | ORAL_TABLET | Freq: Every day | ORAL | Status: DC

## 2014-06-06 NOTE — Patient Instructions (Signed)
1.  Decrease Amlodipine 1omg to 1/2 tablet daily. 2. Start Hydrochlorathiazide 12.5mg  one daily for swelling.  You can continue to take while legs are swollen. 3.  Check blood pressure every other day.

## 2014-06-06 NOTE — Progress Notes (Signed)
Subjective:    Patient ID: Lauren Paul, female    DOB: [birth year], 48 y.o.   MRN: [id number] This chart was scribed for Wardell Honour, MD by Rosary Lively, ED scribe. This patient was seen in room Room/bed 29 and the patient's care was started at 3:30 PM.   06/06/2014  Follow-up, Sinusitis and Hypertension   HPI HPI Comments:  Lauren Paul is a 48 y.o. female who presents to [facility] for a one month follow up for fatigue.  Pt reports that she is still fatigued. Pt reports that she is trying to exercise as much as possible. She reports that she has a pedometer, and does about 10,000 steps a day. Pt denies CP, or palpitations when walking. Pt does not believe she snores, however; she does not wake-up refreshed. Pt also believes that it may also be partly due to menopause. Pt denies SOB when she lies down.  She did not schedule a follow-up appointment with cardiology after last visit.  Pt reports that she is experiencing swelling, with associated symptoms of joint aches and bruising easily, onset 4 days ago. Pt reports that she checks her BP every other day. Pt denies nose bleed, bleeding from gums, or hematuria.  Review of Systems  Constitutional: Negative for fever, chills, diaphoresis and fatigue.  HENT: Negative for nosebleeds.   Eyes: Negative for visual disturbance.  Respiratory: Negative for cough and shortness of breath.   Cardiovascular: Positive for leg swelling. Negative for chest pain and palpitations.  Gastrointestinal: Negative for nausea, vomiting, abdominal pain, diarrhea and constipation.  Endocrine: Negative for cold intolerance, heat intolerance, polydipsia, polyphagia and polyuria.  Genitourinary: Negative for hematuria.  Musculoskeletal: Positive for arthralgias, joint swelling and myalgias.  Neurological: Negative for dizziness, tremors, seizures, syncope, facial asymmetry, speech difficulty, weakness, light-headedness, numbness and headaches.  Hematological:  Bruises/bleeds easily.    Past Medical History  Diagnosis Date  . Allergy   . Anemia   . Asthma   . Ulcer   . Meningitis   . Sepsis   . Hypertension    Past Surgical History  Procedure Laterality Date  . Ablation    . Dilation and curettage of uterus    . Tubes tied    . Tubal ligation     Allergies  Allergen Reactions  . Benadryl [Diphenhydramine Hcl] Shortness Of Breath    Pt states she can take Childrens Benadryl dye free fine.  . Latex Swelling  . Penicillins Anaphylaxis    Pt states she can not take anything in the "cillin" family.  . Shellfish Allergy    Current Outpatient Prescriptions  Medication Sig Dispense Refill  . amLODipine (NORVASC) 10 MG tablet Take 1 tablet (10 mg total) by mouth daily.  90 tablet  3  . escitalopram (LEXAPRO) 10 MG tablet Take 10 mg by mouth daily.      . Multiple Vitamin (MULTIVITAMIN) capsule Take 1 capsule by mouth daily.      Marland Kitchen OVER THE COUNTER MEDICATION daily.      . hydrochlorothiazide (HYDRODIURIL) 12.5 MG tablet Take 1 tablet (12.5 mg total) by mouth daily.  30 tablet  1   No current facility-administered medications for this visit.   History   Social History  . Marital Status: Single    Spouse Name: N/A    Number of Children: N/A  . Years of Education: N/A   Occupational History  . Not on file.   Social History Main Topics  . Smoking status: Never Smoker   .  Smokeless tobacco: Not on file  . Alcohol Use: 1.0 oz/week    2 drink(s) per week  . Drug Use: Not on file  . Sexual Activity: Not on file   Other Topics Concern  . Not on file   Social History Narrative   Marital status:  Single; not dating.      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      Tobacco:  None      Alcohol:  Weekends; 2 glasses of wine weekly      Exercise:  Sporadic.    Family History  Problem Relation Age of Onset  . Cancer Mother 23     Bladder Cancer; Uterine cancer  . Cancer Father     Lung Cancer  . Hypertension Sister        Objective:    BP 112/76  Pulse 73  Temp(Src) 98.2 F (36.8 C) (Oral)  Resp 16  Ht 5' 0.5" (1.537 m)  Wt 152 lb 6.4 oz (69.128 kg)  BMI 29.26 kg/m2  SpO2 98% Physical Exam  Nursing note and vitals reviewed. Constitutional: She is oriented to person, place, and time. She appears well-developed and well-nourished. No distress.  HENT:  Head: Normocephalic and atraumatic.  Right Ear: External ear normal.  Left Ear: External ear normal.  Nose: Nose normal.  Mouth/Throat: Oropharynx is clear and moist.  Eyes: Conjunctivae and EOM are normal. Pupils are equal, round, and reactive to light.  Neck: Normal range of motion. Neck supple. Carotid bruit is not present. No thyromegaly present.  Cardiovascular: Normal rate, regular rhythm, normal heart sounds and intact distal pulses.  Exam reveals no gallop and no friction rub.   No murmur heard. Pulmonary/Chest: Effort normal and breath sounds normal. She has no wheezes. She has no rales.  Abdominal: Soft. Bowel sounds are normal. She exhibits no distension and no mass. There is no tenderness. There is no rebound and no guarding.  Musculoskeletal: Normal range of motion. She exhibits edema (Swelling in ankles).  Lymphadenopathy:    She has no cervical adenopathy.  Neurological: She is alert and oriented to person, place, and time. No cranial nerve deficit.  Skin: Skin is warm and dry. No rash noted. She is not diaphoretic. No erythema. No pallor.  Scattered ecchymoses along forearms and legs B.  Psychiatric: She has a normal mood and affect. Her behavior is normal.   Results for orders placed in visit on 06/06/14  CBC WITH DIFFERENTIAL      Result Value Ref Range   WBC 7.4  4.0 - 10.5 K/uL   RBC 4.51  3.87 - 5.11 MIL/uL   Hemoglobin 12.5  12.0 - 15.0 g/dL   HCT 36.5  36.0 - 46.0 %   MCV 80.9  78.0 - 100.0 fL   MCH 27.7  26.0 - 34.0 pg    MCHC 34.2  30.0 - 36.0 g/dL   RDW 16.4 (*) 11.5 - 15.5 %   Platelets 287  150 - 400 K/uL   Neutrophils Relative % 62  43 - 77 %   Neutro Abs 4.6  1.7 - 7.7 K/uL   Lymphocytes Relative 28  12 - 46 %   Lymphs Abs 2.1  0.7 - 4.0 K/uL   Monocytes Relative 7  3 - 12 %   Monocytes Absolute 0.5  0.1 - 1.0 K/uL   Eosinophils  Relative 3  0 - 5 %   Eosinophils Absolute 0.2  0.0 - 0.7 K/uL   Basophils Relative 0  0 - 1 %   Basophils Absolute 0.0  0.0 - 0.1 K/uL   Smear Review Criteria for review not met    COMPREHENSIVE METABOLIC PANEL      Result Value Ref Range   Sodium 136  135 - 145 mEq/L   Potassium 4.0  3.5 - 5.3 mEq/L   Chloride 102  96 - 112 mEq/L   CO2 26  19 - 32 mEq/L   Glucose, Bld 88  70 - 99 mg/dL   BUN 12  6 - 23 mg/dL   Creat 0.67  0.50 - 1.10 mg/dL   Total Bilirubin 0.4  0.2 - 1.2 mg/dL   Alkaline Phosphatase 64  39 - 117 U/L   AST 17  0 - 37 U/L   ALT 9  0 - 35 U/L   Total Protein 7.1  6.0 - 8.3 g/dL   Albumin 4.3  3.5 - 5.2 g/dL   Calcium 9.4  8.4 - 10.5 mg/dL  SEDIMENTATION RATE      Result Value Ref Range   Sed Rate 7  0 - 22 mm/hr       Assessment & Plan:   1. Essential hypertension, benign   2. Leg swelling   3. Hypersomnolence   4. Easy bruising   5. Arthralgia    1. HTN: controlled but suffering with swelling with Amlodipine; decrease Amlodipine 38m to 1/2 tablet daily; add HCTZ 12.534mdaily.  Check blood pressure daily.  RTC six weeks for recheck. 2.  Leg swelling: New. Likely secondary to summer months, Amlodipine use.  Decrease Amlodipine from 1080mo 5mg24mx for HCTZ 12.5mg 46m tablet daily provided for leg swelling and BP control.  3. Hypersomnolence: persistent; refer for sleep study. 4. Easy bruising: New. Obtain CBC.   5 .  Arthralgias: New.  Obtain ESR, BMET. If persists, obtain autoimmune labs and refer to rheumatology.  Likely secondary to leg swelling.  Meds ordered this encounter  Medications  . hydrochlorothiazide (HYDRODIURIL) 12.5 MG  tablet    Sig: Take 1 tablet (12.5 mg total) by mouth daily.    Dispense:  30 tablet    Refill:  1    Return in about 6 weeks (around 07/18/2014).  I personally performed the services described in this documentation, which was scribed in my presence.  The recorded information has been reviewed and is accurate.   KristReginia Forts.  [facility] [phone]

## 2014-06-07 LAB — CBC WITH DIFFERENTIAL/PLATELET
BASOS PCT: 0 % (ref 0–1)
Basophils Absolute: 0 10*3/uL (ref 0.0–0.1)
Eosinophils Absolute: 0.2 10*3/uL (ref 0.0–0.7)
Eosinophils Relative: 3 % (ref 0–5)
HCT: 36.5 % (ref 36.0–46.0)
Hemoglobin: 12.5 g/dL (ref 12.0–15.0)
LYMPHS ABS: 2.1 10*3/uL (ref 0.7–4.0)
Lymphocytes Relative: 28 % (ref 12–46)
MCH: 27.7 pg (ref 26.0–34.0)
MCHC: 34.2 g/dL (ref 30.0–36.0)
MCV: 80.9 fL (ref 78.0–100.0)
MONOS PCT: 7 % (ref 3–12)
Monocytes Absolute: 0.5 10*3/uL (ref 0.1–1.0)
NEUTROS ABS: 4.6 10*3/uL (ref 1.7–7.7)
Neutrophils Relative %: 62 % (ref 43–77)
Platelets: 287 10*3/uL (ref 150–400)
RBC: 4.51 MIL/uL (ref 3.87–5.11)
RDW: 16.4 % — ABNORMAL HIGH (ref 11.5–15.5)
WBC: 7.4 10*3/uL (ref 4.0–10.5)

## 2014-06-07 LAB — SEDIMENTATION RATE: SED RATE: 7 mm/h (ref 0–22)

## 2014-06-07 LAB — COMPREHENSIVE METABOLIC PANEL
ALBUMIN: 4.3 g/dL (ref 3.5–5.2)
ALK PHOS: 64 U/L (ref 39–117)
ALT: 9 U/L (ref 0–35)
AST: 17 U/L (ref 0–37)
BUN: 12 mg/dL (ref 6–23)
CO2: 26 mEq/L (ref 19–32)
Calcium: 9.4 mg/dL (ref 8.4–10.5)
Chloride: 102 mEq/L (ref 96–112)
Creat: 0.67 mg/dL (ref 0.50–1.10)
GLUCOSE: 88 mg/dL (ref 70–99)
POTASSIUM: 4 meq/L (ref 3.5–5.3)
Sodium: 136 mEq/L (ref 135–145)
Total Bilirubin: 0.4 mg/dL (ref 0.2–1.2)
Total Protein: 7.1 g/dL (ref 6.0–8.3)

## 2014-06-10 ENCOUNTER — Telehealth: Payer: Self-pay | Admitting: Neurology

## 2014-06-10 NOTE — Telephone Encounter (Addendum)
error 

## 2014-06-12 ENCOUNTER — Encounter: Payer: Self-pay | Admitting: Family Medicine

## 2014-06-13 ENCOUNTER — Telehealth: Payer: Self-pay | Admitting: Neurology

## 2014-06-13 ENCOUNTER — Institutional Professional Consult (permissible substitution): Payer: BC Managed Care – PPO | Admitting: Neurology

## 2014-06-13 ENCOUNTER — Encounter: Payer: Self-pay | Admitting: Neurology

## 2014-06-13 NOTE — Telephone Encounter (Signed)
Patient no showed for an appointment today, 06/13/2014, at 0900.

## 2014-06-19 DIAGNOSIS — G4710 Hypersomnia, unspecified: Secondary | ICD-10-CM

## 2014-06-19 DIAGNOSIS — I10 Essential (primary) hypertension: Secondary | ICD-10-CM | POA: Insufficient documentation

## 2014-06-19 HISTORY — DX: Hypersomnia, unspecified: G47.10

## 2014-07-18 ENCOUNTER — Ambulatory Visit: Payer: BC Managed Care – PPO | Admitting: Family Medicine

## 2014-07-20 ENCOUNTER — Encounter: Payer: Self-pay | Admitting: Family Medicine

## 2014-07-20 ENCOUNTER — Ambulatory Visit (INDEPENDENT_AMBULATORY_CARE_PROVIDER_SITE_OTHER): Payer: BC Managed Care – PPO | Admitting: Family Medicine

## 2014-07-20 VITALS — BP 121/77 | HR 68 | Temp 98.4°F | Resp 16 | Ht 60.0 in | Wt 154.4 lb

## 2014-07-20 DIAGNOSIS — I10 Essential (primary) hypertension: Secondary | ICD-10-CM

## 2014-07-20 DIAGNOSIS — M7989 Other specified soft tissue disorders: Secondary | ICD-10-CM

## 2014-07-20 DIAGNOSIS — R5383 Other fatigue: Secondary | ICD-10-CM

## 2014-07-20 DIAGNOSIS — R5381 Other malaise: Secondary | ICD-10-CM

## 2014-07-20 MED ORDER — HYDROCHLOROTHIAZIDE 12.5 MG PO TABS: 12.5000 mg | ORAL_TABLET | Freq: Every day | ORAL | Status: DC

## 2014-07-20 NOTE — Progress Notes (Signed)
Subjective:    Patient ID: Lauren Paul, female    DOB: Apr 15, 1966, 48 y.o.   MRN: [id number]  HPI Patient presents to clinic for 6 week follow up. HCTZ was well tolerated and denies side effects of dizziness, HA, orthopnea, or GI disturbance. Ankle swelling and home blood pressure readings were improved, but are returning now that patient has not had medication for 1 1/2 weeks. Blood pressures while on HCTZ where systolic: 505-397. She does not recall diastolic readings. Post-HCTZ use BP has been as high as 170/80. Readings taken in a.m. and after work everyday. No doses of amlodipine missed and takes nightly.   Has become more active and has started kickboxing for the past month which has caused some left knee swelling. Patient drinks 64 oz of water/day and a can of mountain dew/day. She reports eating small portions and eating healthy (eggs, oatmeal, vegetables, chicken, fish, salad; baked not fried), however, is worried that the Lexapro is causing her to gain weight. Does not want adjustment to Lexapro as she does not want hot flashes to return.  She did not schedule sleep study as she feels that she has more energy now that she has started exercising. Feels rested in the morning and does not take naps.  Bruising reported in last visit resolved.  Flu shot refused.  Review of Systems  Constitutional: Positive for activity change (increased) and appetite change (increased). Negative for fever and fatigue.  HENT: Negative for congestion, ear discharge, ear pain, nosebleeds, rhinorrhea, sneezing and sore throat.   Eyes: Negative for visual disturbance.  Respiratory: Negative for cough, chest tightness, shortness of breath and wheezing.   Cardiovascular: Positive for leg swelling. Negative for chest pain.  Gastrointestinal: Negative for nausea, vomiting, abdominal pain, diarrhea and constipation.  Endocrine: Positive for polyphagia. Negative for polydipsia and polyuria.  Genitourinary:  Positive for frequency. Negative for dysuria, urgency and flank pain.  Musculoskeletal: Positive for joint swelling. Negative for arthralgias, gait problem and myalgias.       No orthopnea  Allergic/Immunologic: Negative for environmental allergies.  Neurological: Negative for dizziness, weakness, light-headedness and headaches.  Hematological: Does not bruise/bleed easily.  Psychiatric/Behavioral: Negative for sleep disturbance, dysphoric mood and agitation. The patient is not nervous/anxious.        Objective:   Physical Exam  Constitutional: She is oriented to person, place, and time. She appears well-developed and well-nourished.  HENT:  Head: Normocephalic and atraumatic.  Right Ear: External ear normal.  Left Ear: External ear normal.  Nose: Nose normal.  Mouth/Throat: Oropharynx is clear and moist.  Eyes: Conjunctivae and EOM are normal. Pupils are equal, round, and reactive to light. Right eye exhibits no discharge. Left eye exhibits no discharge. No scleral icterus.  Neck: Neck supple. No JVD present. No thyromegaly present.  Cardiovascular: Normal rate, regular rhythm, normal heart sounds and intact distal pulses.  Exam reveals no gallop and no friction rub.   No murmur heard. Pulmonary/Chest: Breath sounds normal. No stridor. No respiratory distress. She has no wheezes. She has no rales.  Abdominal: Soft. Bowel sounds are normal. She exhibits no distension and no mass. There is no tenderness. There is no rebound.  Musculoskeletal: Normal range of motion. She exhibits no edema.  Lymphadenopathy:    She has no cervical adenopathy.  Neurological: She is alert and oriented to person, place, and time.  Skin: Skin is warm and dry. No rash noted. No erythema.  Psychiatric: She has a normal mood and affect. Her behavior  is normal. Judgment and thought content normal.   Blood pressure 121/77, pulse 68, temperature 98.4 F (36.9 C), temperature source Oral, resp. rate 16, height 5'  (1.524 m), weight 154 lb 6.4 oz (70.035 kg), SpO2 98.00%.      Assessment & Plan:   1. Essential hypertension, benign Better daily control while on HCTZ will restart.  - BASIC METABOLIC PANEL WITH GFR - hydrochlorothiazide (HYDRODIURIL) 12.5 MG tablet; Take 1 tablet (12.5 mg total) by mouth daily.  Dispense: 90 tablet; Refill: 3 - Follow up in 6 weeks to check toleration of medication restart.   2. Leg swelling Improved while on HCTZ.  - hydrochlorothiazide (HYDRODIURIL) 12.5 MG tablet; Take 1 tablet (12.5 mg total) by mouth daily.  Dispense: 90 tablet; Refill: 3  3. Other malaise and fatigue Resolved.   Alveta Heimlich PA-C  [facility] 07/20/2014 2:49 PM

## 2014-07-21 LAB — BASIC METABOLIC PANEL WITH GFR
BUN: 16 mg/dL (ref 6–23)
CALCIUM: 9.6 mg/dL (ref 8.4–10.5)
CHLORIDE: 104 meq/L (ref 96–112)
CO2: 25 mEq/L (ref 19–32)
Creat: 0.85 mg/dL (ref 0.50–1.10)
GFR, EST NON AFRICAN AMERICAN: 82 mL/min
GFR, Est African American: >89 mL/min
Glucose, Bld: 95 mg/dL (ref 70–99)
Potassium: 4.1 mEq/L (ref 3.5–5.3)
Sodium: 134 mEq/L — ABNORMAL LOW (ref 135–145)

## 2014-07-21 NOTE — Progress Notes (Signed)
History and physical examinations obtained with Tishira Brewington, PA-C.  Agree with assessment and plan.  Recommend checking BP daily; call if BP< 100/50 or > 140/90.

## 2014-09-20 ENCOUNTER — Telehealth: Payer: Self-pay

## 2014-09-20 NOTE — Telephone Encounter (Signed)
Has 140/100 blood pressure   Taking  amLODipine (NORVASC) 10 MG tablet hydrochlorothiazide (HYDRODIURIL) 12.5 MG tablet  At nighttime  [phone]

## 2014-09-21 ENCOUNTER — Ambulatory Visit (INDEPENDENT_AMBULATORY_CARE_PROVIDER_SITE_OTHER): Payer: BC Managed Care – PPO

## 2014-09-21 ENCOUNTER — Ambulatory Visit (INDEPENDENT_AMBULATORY_CARE_PROVIDER_SITE_OTHER): Payer: BC Managed Care – PPO | Admitting: Family Medicine

## 2014-09-21 VITALS — BP 122/80 | HR 60 | Temp 98.4°F | Resp 16 | Ht 60.0 in | Wt 158.0 lb

## 2014-09-21 DIAGNOSIS — R079 Chest pain, unspecified: Secondary | ICD-10-CM

## 2014-09-21 DIAGNOSIS — I10 Essential (primary) hypertension: Secondary | ICD-10-CM

## 2014-09-21 DIAGNOSIS — R0602 Shortness of breath: Secondary | ICD-10-CM

## 2014-09-21 DIAGNOSIS — I159 Secondary hypertension, unspecified: Secondary | ICD-10-CM

## 2014-09-21 DIAGNOSIS — Z79899 Other long term (current) drug therapy: Secondary | ICD-10-CM

## 2014-09-21 MED ORDER — HYDROCHLOROTHIAZIDE 25 MG PO TABS: 25.0000 mg | ORAL_TABLET | Freq: Every day | ORAL | Status: DC

## 2014-09-21 MED ORDER — GI COCKTAIL ~~LOC~~: 30.0000 mL | Freq: Once | ORAL | Status: DC

## 2014-09-21 MED ORDER — AMLODIPINE BESYLATE 5 MG PO TABS: 5.0000 mg | ORAL_TABLET | Freq: Every day | ORAL | Status: DC

## 2014-09-21 NOTE — Patient Instructions (Signed)

## 2014-09-21 NOTE — Progress Notes (Signed)
MRN: [id number] DOB: [birth year]  Subjective:   Lauren Paul is a 48 y.o. female with PMH of HTN , asthma, allergy, hx of anemia  presenting for chest pain and elevated BP.  She reports that she has been on anti-hypertensives of amlodipine 10mg  and hydrochlorothiazide 25mg .  She checks her BP at home with a wrist blood pressure cuff which ranges with systolic 416S/06T consistently.  3 days ago, she felt a new onset of chest pain at rest while at work.  She describes the pain as dull "my heart was heavy", located at the midsternum without radiation.  This has been constant since.  She does not know of any position that relieves or aggravates the pain.  She denies any nausea, though her stomach has felt uneasy, nor vomiting, dizziness, palpitations, or diaphoresis.  Exertion does not make the pain worse.  The sob, however has gotten worse with exertion.  She notes that she is unable to get up 2 flights of steps without feeling winded, and needing to rest.  She has also noticed peripheral edema of her hands and feet.  She denies PND, worsening cp with deep inspiration.    She then had sob.  along while at work   She is   Started 3 days ago, sepsis,btac.  Hospital for a month with a picc line. April Ablation tubes tied, following.  Cuff 146/97.  Headache.  Chest pain sternum, dull "like heart is heavy", at work stressful at rest.  No radiating pain.  Associated sob,    Steps at work, but 2 flights of stairs are winded.  A little sick to stomach over the last few days.  No sweating.  No dizziness.  No fever chills.  No recent illnesses.  Does not exercise since the sepsis.  She denies chest pain exacerbated with meals or more chest pain with supine position.  She denies abnormal bleeding.  She has normal bowel movements.  She has no hx of cardiac disease that she knows of.    No smoking.  Wine 1-2 week.  No heavy etoh consumption during Thanksgiving.  .     Prior to Admission medications   Medication Sig  Start Date End Date Taking? Authorizing Provider  amLODipine (NORVASC) 10 MG tablet Take 1 tablet (10 mg total) by mouth daily. 05/03/14  Yes Orma Flaming, MD  cyanocobalamin 500 MCG tablet Take 500 mcg by mouth daily.   Yes Historical Provider, MD  escitalopram (LEXAPRO) 10 MG tablet Take 10 mg by mouth daily.   Yes Historical Provider, MD  hydrochlorothiazide (HYDRODIURIL) 12.5 MG tablet Take 1 tablet (12.5 mg total) by mouth daily. 07/20/14  Yes Wardell Honour, MD  Multiple Vitamin (MULTIVITAMIN) capsule Take 1 capsule by mouth daily.   Yes Historical Provider, MD  OVER THE COUNTER MEDICATION daily.   Yes Historical Provider, MD    Allergies  Allergen Reactions  . Benadryl [Diphenhydramine Hcl] Shortness Of Breath    Pt states she can take Childrens Benadryl dye free fine.  . Latex Swelling  . Penicillins Anaphylaxis    Pt states she can not take anything in the "cillin" family.  Marland Kitchen Shellfish Allergy     Past Medical History  Diagnosis Date  . Allergy   . Anemia   . Asthma   . Ulcer   . Meningitis   . Sepsis   . Hypertension   . Hypersomnolence 06/19/2014    Past Surgical History  Procedure Laterality Date  . Ablation    .  Dilation and curettage of uterus    . Tubal ligation      ROS As in subjective.  Objective:   Vitals: BP 122/80 mmHg  Pulse 60  Temp(Src) 98.4 F (36.9 C) (Oral)  Resp 16  Ht 5' (1.524 m)  Wt 158 lb (71.668 kg)  BMI 30.86 kg/m2  SpO2 99%  Physical Exam  Constitutional: She is oriented to person, place, and time and well-developed, well-nourished, and in no distress. Vital signs are normal. No distress.  HENT:  Head: Normocephalic and atraumatic.  Eyes: Conjunctivae are normal. Pupils are equal, round, and reactive to light. Right conjunctiva is not injected. Right conjunctiva has no hemorrhage.  Neck: Normal range of motion. Neck supple. Carotid bruit is not present. No thyroid mass and no thyromegaly present.  Cardiovascular: Regular  rhythm and intact distal pulses.  Bradycardia present.   Heart sounds are distant, however no frictional rub, gallop, or murmur.    Pulmonary/Chest: Breath sounds normal. No accessory muscle usage. No apnea. No respiratory distress. She has no decreased breath sounds. She has no wheezes. She has no rhonchi. She exhibits tenderness (Palpation of left mid to lower sternum is tender.  No erythema, brusing or bony abnormality is appreciated.  ).  Musculoskeletal:  No pitting edema appreciated.  Mild edema to upper extremity fingers and hands.    Lymphadenopathy:    She has no cervical adenopathy.  Neurological: She is alert and oriented to person, place, and time.  Skin: Skin is warm and dry. She is not diaphoretic.  Psychiatric: Mood, memory, affect and judgment normal.   Wrist Cuff BP for the home: 140/99, Arm cuff BP at office: 122/82   EKG: No acute findings or ST changes to 05/03/2014  [facility] reading (PRIMARY) by  Dr. Carlota Raspberry. Borderline Cardiomegaly.  Questionable measured markings at right lower lobe.  No discrete infiltrate.    GI cocktail given: No relief of chest pain with after 45 minutes.     Assessment and Plan :  48 year old female with HTN, hx of anemia, and asthma is here today with chest pains, DOE, and sob.   Patient demonstrates no sign through imaging and EKG, of an acute event.  The chest pain is new as well as the sob, and I am concerned that this is a new cardiac issue and a full work up should be in place at this time.  I have expressed my desire for the patient to go to the ED, though she does not have any acute findings.  She has declined.  I am referring her to cardiology, to be seen within 24-48hrs. I discussed alarming chest pain symptoms that would require immediate reporting to the ED, both verbally and in written format.  She is agreeable to the plan.  I am decreasing the amlodopine to 5mg  daily, and increasing the HCTZ to 25mg  qd.  Also discussed with patient the  defectiveness of her current BP wrist device.  She is aware.      SOB (shortness of breath)  EKG 12-Lead,  gi cocktail (Maalox,Lidocaine,Donnatal),  DG Chest 2 View  Chest pain, unspecified chest pain type  EKG 12-Lead, gi cocktail (Maalox,Lidocaine,Donnatal), DG Chest 2 View,  Urgent [facility]iology referral  Secondary hypertension, unspecified  EKG 12-Lead, gi cocktail (Maalox,Lidocaine,Donnatal),  amLODipine (NORVASC) 5 MG tablet,  hydrochlorothiazide (HYDRODIURIL) 25 MG tablet  Essential hypertension 25 MG tablet  Encounter for medication review   Ivar Drape, PA-C [facility]  12/2/20159:45 PM

## 2014-09-21 NOTE — Telephone Encounter (Signed)
Pt states her BP yesterday and today was 148/100 and 140/100. This has been atypical since starting BP medication. Pt has a little SOB and a mild headache. Advised pt to RTC or go to the ED. Pt states she will be in as soon as possible.

## 2014-09-22 NOTE — Telephone Encounter (Signed)
Noted. Patient evaluated in office on 09/21/14.

## 2014-09-25 NOTE — Progress Notes (Signed)
Patient discussed and examined with Lauren Paul. Agree with assessment and plan of care per her note.   

## 2014-10-31 ENCOUNTER — Encounter: Payer: Self-pay | Admitting: Neurology

## 2014-11-07 ENCOUNTER — Ambulatory Visit (INDEPENDENT_AMBULATORY_CARE_PROVIDER_SITE_OTHER): Payer: BLUE CROSS/BLUE SHIELD | Admitting: Neurology

## 2014-11-07 ENCOUNTER — Encounter: Payer: Self-pay | Admitting: Neurology

## 2014-11-07 VITALS — BP 122/82 | HR 54 | Temp 97.6°F | Ht 61.0 in | Wt 159.0 lb

## 2014-11-07 DIAGNOSIS — R51 Headache: Secondary | ICD-10-CM

## 2014-11-07 DIAGNOSIS — R519 Headache, unspecified: Secondary | ICD-10-CM

## 2014-11-07 DIAGNOSIS — G4734 Idiopathic sleep related nonobstructive alveolar hypoventilation: Secondary | ICD-10-CM

## 2014-11-07 DIAGNOSIS — G2581 Restless legs syndrome: Secondary | ICD-10-CM

## 2014-11-07 DIAGNOSIS — R351 Nocturia: Secondary | ICD-10-CM

## 2014-11-07 DIAGNOSIS — G4719 Other hypersomnia: Secondary | ICD-10-CM

## 2014-11-07 NOTE — Patient Instructions (Addendum)

## 2014-11-07 NOTE — Progress Notes (Signed)
Subjective:    Patient ID: Lauren Paul is a 49 y.o. female.  HPI    Lauren Age, MD, PhD [facility] [address], [address] [place], [place] [address]  Dear Ulice Dash,   I saw your patient, Lauren Paul, upon your kind request in my neurologic clinic today for initial consultation of her sleep disturbance, concern for underlying obstructive sleep apnea. The patient is unaccompanied today. As you know, Lauren Paul is a 49 year old right-handed woman with an underlying medical history of asthma, obesity, history of sepsis in 4/15 with a prolonged hospitalization, history of meningitis (viral, 1992), hypertension, allergies and anemia, who complains of daytime somnolence. Of note, she no showed previously for an appointment with me on 06/13/2014. She had a recent overnight pulse oximetry test on 10/31/2014, which I reviewed: Baseline oxygen saturation was 91.9%, nadir was 78%, time below 88% saturation was 1 hour and 4 minutes, however there was a disruption and recording noted as well. Her Epworth sleepiness score is 14 out of 24 today. She reports morning headaches approximately 2 times per week. She had seen a neurologist in [facility]. She reports nocturia which is significant. She goes to the bathroom at night 3-5 times. She does not wake up rested. She works as a Radiation protection practitioner during the day and she also has a part-time job in Scientist, research (medical) where she works from 5:30 PM to 9:30 PM but not every day. Bedtime can be as early as 8 PM and while she falls asleep quickly she does not stay asleep very well and particularly her sleep is disrupted because of nocturia. She has occasional restless leg symptoms. She sleeps alone. She has a cat in her bed with her. She has the TV on all night but has the volume low. She drinks one [facility] per day and no coffee. She drinks wine once or twice per week. She is a nonsmoker. She has no family history of obstructive sleep  apnea. Eyes time is 6 AM and she does not feel well rested in the mornings.  Since she sleeps alone most of the time she is not aware of any breathing pauses while asleep. She is not sure if she snores. She gained weight since her prolonged hospitalization in April last year secondary to sepsis. She had recent chest pain. Echocardiogram from 10/04/2014 showed left ventricular apical hypertrophy. Normal global wall motion. EF was 56%. There was borderline dilatation of left atrium. Mild mitral regurgitation and mild tricuspid regurgitation and no evidence of pulmonary hypertension. She had a nuclear stress test on 02/18/2014: This was reported as normal with occasional PVCs and a 4 beat run of PVCs at peak exercise.  Her Past Medical History Is Significant For: Past Medical History  Diagnosis Date  . Allergy   . Anemia   . Asthma   . Ulcer   . Meningitis   . Sepsis   . Hypertension   . Hypersomnolence 06/19/2014  . Headache     migraines    Her Past Surgical History Is Significant For: Past Surgical History  Procedure Laterality Date  . Ablation    . Dilation and curettage of uterus    . Tubal ligation      Her Family History Is Significant For: Family History  Problem Relation Paul of Onset  . Cancer Mother 64    Bladder Cancer; Uterine cancer  . Cancer Father     Lung Cancer  . Hypertension Sister  Her Social History Is Significant For: History   Social History  . Marital Status: Single    Spouse Name: N/A    Number of Children: 0  . Years of Education: 12   Occupational History  .      [employer]   Social History Main Topics  . Smoking status: Never Smoker   . Smokeless tobacco: Never Used  . Alcohol Use: 1.2 oz/week    2 Not specified per week     Comment: 2-3 glasses of wkly  . Drug Use: No  . Sexual Activity: None   Other Topics Concern  . None   Social History Narrative   Marital status:  Single; not dating.      Children:none      Lives: alone       Employment:  Press photographer; Therapist, art; receptionist.  Second job for [employer] x 19 years.      Tobacco:  None      Alcohol:  Weekends; 2 glasses of wine weekly      Exercise:  Sporadic. One dup of caffeine daily.    Her Allergies Are:  Allergies  Allergen Reactions  . Benadryl [Diphenhydramine Hcl] Shortness Of Breath    Pt states she can take Childrens Benadryl dye free fine.  . Latex Swelling  . Penicillins Anaphylaxis    Pt states she can not take anything in the "cillin" family.  Marland Kitchen Apap-Pamabrom-Pyrilamine   . Iodides   . Other     Watermelon- Anaphylaxis Nuts Melon  . Shellfish Allergy   . Sulfa Antibiotics   :   Her Current Medications Are:  Outpatient Encounter Prescriptions as of 11/07/2014  Medication Sig  . amLODipine (NORVASC) 5 MG tablet Take 1 tablet (5 mg total) by mouth daily.  . cyanocobalamin 500 MCG tablet Take 500 mcg by mouth daily.  Marland Kitchen escitalopram (LEXAPRO) 10 MG tablet Take 10 mg by mouth daily.  . hydrochlorothiazide (HYDRODIURIL) 25 MG tablet Take 1 tablet (25 mg total) by mouth daily.  . Multiple Vitamin (MULTIVITAMIN) capsule Take 1 capsule by mouth daily.  . [DISCONTINUED] OVER THE COUNTER MEDICATION daily.  :  Review of Systems:  Out of a complete 14 point review of systems, all are reviewed and negative with the exception of these symptoms as listed below:   Review of Systems  Constitutional:       Weight gain, fatigue  HENT:       Ringing in ears  Respiratory: Positive for shortness of breath.   Endocrine: Positive for heat intolerance.  Musculoskeletal:       Joint pain  Skin:       itching  Allergic/Immunologic:       Allergies  Neurological: Positive for headaches.  Hematological: Bruises/bleeds easily.    Objective:  Neurologic Exam  Physical Exam Physical Examination:   Filed Vitals:   11/07/14 1011  BP: 122/82  Pulse: 54  Temp: 97.6 F (36.4 C)    General Examination: The patient is a very pleasant 49  y.o. female in no acute distress. She appears well-developed and well-nourished and very well groomed.   HEENT: Normocephalic, atraumatic, pupils are equal, round and reactive to light and accommodation. Funduscopic exam is normal with sharp disc margins noted. Extraocular tracking is good without limitation to gaze excursion or nystagmus noted. Normal smooth pursuit is noted. Hearing is grossly intact. Tympanic membranes are clear bilaterally. Face is symmetric with normal facial animation and normal facial sensation. Speech is clear with no  dysarthria noted. There is no hypophonia. There is no lip, neck/head, jaw or voice tremor. Neck is supple with full range of passive and active motion. There are no carotid bruits on auscultation. Oropharynx exam reveals: mild mouth dryness, good dental hygiene and moderate airway crowding, due to narrow airway entry, tonsils of 1+, elongated tongue. Mallampati is class II. Tongue protrudes centrally and palate elevates symmetrically. Neck size is 14-3/4 inches. She has a mild overbite.   Chest: Clear to auscultation without wheezing, rhonchi or crackles noted.  Heart: S1+S2+0, regular and normal without murmurs, rubs or gallops noted.   Abdomen: Soft, non-tender and non-distended with normal bowel sounds appreciated on auscultation.  Extremities: There is no pitting edema in the distal lower extremities bilaterally. Pedal pulses are intact.  Skin: Warm and dry without trophic changes noted. There are no varicose veins.  Musculoskeletal: exam reveals no obvious joint deformities, tenderness or joint swelling or erythema.   Neurologically:  Mental status: The patient is awake, alert and oriented in all 4 spheres. Her immediate and remote memory, attention, language skills and fund of knowledge are appropriate. There is no evidence of aphasia, agnosia, apraxia or anomia. Speech is clear with normal prosody and enunciation. Thought process is linear. Mood is  normal and affect is normal.  Cranial nerves II - XII are as described above under HEENT exam. In addition: shoulder shrug is normal with equal shoulder height noted. Motor exam: Normal bulk, strength and tone is noted. There is no drift, tremor or rebound. Romberg is negative. Reflexes are 2+ throughout. Babinski: Toes are flexor bilaterally. Fine motor skills and coordination: intact with normal finger taps, normal hand movements, normal rapid alternating patting, normal foot taps and normal foot agility.  Cerebellar testing: No dysmetria or intention tremor on finger to nose testing. Heel to shin is unremarkable bilaterally. There is no truncal or gait ataxia.  Sensory exam: intact to light touch, pinprick, vibration, temperature sense proprioception in the upper and lower extremities.  Gait, station and balance: She stands easily. No veering to one side is noted. No leaning to one side is noted. Posture is Paul-appropriate and stance is narrow based. Gait shows normal stride length and normal pace. No problems turning are noted. She turns en bloc. Tandem walk is unremarkable.      Assessment and Plan:  In summary, Lauren Paul is a very pleasant 49 y.o.-year old female with an underlying medical history of asthma, obesity, history of sepsis in 4/15 with a prolonged hospitalization, history of meningitis (viral, 1992), hypertension, allergies and anemia, who complains of daytime somnolence and had nocturnal oxygen desaturations during a recent pulse oximetry test overnight. She reports morning headaches, significant nocturia, not waking up rested and excessive daytime somnolence with an Epworth of 14 today. Her history and physical exam are indeed concerning for underlying obstructive sleep apnea. In addition, she reports restless leg symptoms occasionally.  I had a long chat with the patient about my findings and the diagnosis of OSA, its prognosis and treatment options. We talked about medical  treatments, surgical interventions and non-pharmacological approaches. I explained in particular the risks and ramifications of untreated moderate to severe OSA, especially with respect to developing cardiovascular disease down the Road, including congestive heart failure, difficult to treat hypertension, cardiac arrhythmias, or stroke. Even type 2 diabetes has, in part, been linked to untreated OSA. Symptoms of untreated OSA include daytime sleepiness, memory problems, mood irritability and mood disorder such as depression and anxiety, lack of energy,  as well as recurrent headaches, especially morning headaches. We talked about trying to maintain a healthy lifestyle in general, as well as the importance of weight control. I encouraged the patient to eat healthy, exercise daily and keep well hydrated, to keep a scheduled bedtime and wake time routine, to not skip any meals and eat healthy snacks in between meals. I advised the patient not to drive when feeling sleepy. We also talked about improving her sleep hygiene. I recommended the following at this time: sleep study with potential positive airway pressure titration. (We will score hypopneas at 3% and split the sleep study into diagnostic and treatment portion, if the estimated. 2 hour AHI is >15/h).   I explained the sleep test procedure to the patient and also outlined possible surgical and non-surgical treatment options of OSA, including the use of a custom-made dental device (which would require a referral to a specialist dentist or oral surgeon), upper airway surgical options, such as pillar implants, radiofrequency surgery, tongue base surgery, and UPPP (which would involve a referral to an ENT surgeon). Rarely, jaw surgery such as mandibular advancement may be considered.  I also explained the CPAP treatment option to the patient, who indicated that she would be willing to try CPAP if the need arises. I explained the importance of being compliant with  PAP treatment, not only for insurance purposes but primarily to improve Her symptoms, and for the patient's long term health benefit, including to reduce Her cardiovascular risks. I answered all her questions today and the patient was in agreement. I would like to see her back after the sleep study is completed and encouraged her to call with any interim questions, concerns, problems or updates.   Thank you very much for allowing me to participate in the care of this nice patient. If I can be of any further assistance to you please do not hesitate to call me at [phone].  Sincerely,   Lauren Age, MD, PhD

## 2014-11-15 ENCOUNTER — Ambulatory Visit (INDEPENDENT_AMBULATORY_CARE_PROVIDER_SITE_OTHER): Payer: BLUE CROSS/BLUE SHIELD | Admitting: Neurology

## 2014-11-15 DIAGNOSIS — G4733 Obstructive sleep apnea (adult) (pediatric): Secondary | ICD-10-CM

## 2014-11-15 DIAGNOSIS — G4730 Sleep apnea, unspecified: Secondary | ICD-10-CM

## 2014-11-15 DIAGNOSIS — G4720 Circadian rhythm sleep disorder, unspecified type: Secondary | ICD-10-CM

## 2014-11-15 DIAGNOSIS — G479 Sleep disorder, unspecified: Secondary | ICD-10-CM

## 2014-11-15 DIAGNOSIS — G478 Other sleep disorders: Secondary | ICD-10-CM

## 2014-11-15 DIAGNOSIS — F518 Other sleep disorders not due to a substance or known physiological condition: Secondary | ICD-10-CM

## 2014-11-15 DIAGNOSIS — G4761 Periodic limb movement disorder: Secondary | ICD-10-CM

## 2014-11-15 DIAGNOSIS — G4710 Hypersomnia, unspecified: Secondary | ICD-10-CM

## 2014-11-15 NOTE — Sleep Study (Signed)
Please see the scanned sleep study interpretation located in the Procedure tab within the Chart Review section. 

## 2014-11-18 ENCOUNTER — Telehealth: Payer: Self-pay | Admitting: Neurology

## 2014-11-18 DIAGNOSIS — G4733 Obstructive sleep apnea (adult) (pediatric): Secondary | ICD-10-CM

## 2014-11-18 NOTE — Telephone Encounter (Signed)
Please call and notify the patient that the recent sleep study did confirm the diagnosis of obstructive sleep apnea and that I recommend treatment for this in the form of CPAP. Sleep apnea findings are overall mild, but I would recommend that she consider treatment for this because of her sleep-related complaints, including significant nocturia, morning headaches, and daytime somnolence. She also has evidence of periodic leg movements in sleep and I do recall she had some mild restless leg symptoms. These may improve if we treat her sleep apnea. This will, ideally, require a repeat sleep study for proper titration and mask fitting. Please explain to patient and arrange for a CPAP titration study. I have placed an order in the chart. Thanks, Star Age, MD, PhD [facility]

## 2014-11-21 ENCOUNTER — Encounter: Payer: Self-pay | Admitting: Neurology

## 2014-11-21 ENCOUNTER — Other Ambulatory Visit: Payer: Self-pay

## 2014-11-21 DIAGNOSIS — I159 Secondary hypertension, unspecified: Secondary | ICD-10-CM

## 2014-11-21 DIAGNOSIS — I10 Essential (primary) hypertension: Secondary | ICD-10-CM

## 2014-11-21 MED ORDER — AMLODIPINE BESYLATE 5 MG PO TABS: 5.0000 mg | ORAL_TABLET | Freq: Every day | ORAL | Status: DC

## 2014-11-21 NOTE — Telephone Encounter (Signed)
Patient was contacted and provided the results of her in lab sleep study.  She was advised that a CPAP therapy study had been recommended by Dr. Rexene Alberts to treat her sleep apnea.  Patient was in agreement and scheduled her sleep study for Tuesday Feb. 2nd at 09:30 pm.  Dr. Einar Gip was faxed a copy of the report.

## 2014-11-22 ENCOUNTER — Ambulatory Visit (INDEPENDENT_AMBULATORY_CARE_PROVIDER_SITE_OTHER): Payer: BLUE CROSS/BLUE SHIELD | Admitting: Neurology

## 2014-11-22 DIAGNOSIS — G4761 Periodic limb movement disorder: Secondary | ICD-10-CM

## 2014-11-22 DIAGNOSIS — G479 Sleep disorder, unspecified: Secondary | ICD-10-CM

## 2014-11-22 DIAGNOSIS — G4733 Obstructive sleep apnea (adult) (pediatric): Secondary | ICD-10-CM

## 2014-11-22 NOTE — Sleep Study (Signed)
Please see the scanned sleep study interpretation located in the Procedure tab within the Chart Review section. 

## 2014-11-25 ENCOUNTER — Telehealth: Payer: Self-pay | Admitting: Neurology

## 2014-11-25 DIAGNOSIS — G4733 Obstructive sleep apnea (adult) (pediatric): Secondary | ICD-10-CM

## 2014-11-25 NOTE — Telephone Encounter (Signed)
Please call and inform patient that I have entered an order for treatment with PAP. She did well during the latest sleep study with CPAP. We will, therefore, arrange for a machine for home use through a DME (durable medical equipment) company of Her choice; and I will see the patient back in follow-up in about 6 weeks. Please also explain to the patient that I will be looking out for compliance data downloaded from the machine, which can be done remotely through a modem at times or stored on an SD card in the back of the machine. At the time of the followup appointment we will discuss sleep study results and how it is going with PAP treatment at home. Please advise patient to bring Her machine at the time of the visit; at least for the first visit, even though this is cumbersome. Bringing the machine for every visit after that may not be needed, but often helps for the first visit. Please also make sure, the patient has a follow-up appointment with me in about 6 weeks from the setup date, thanks.   Colen Eltzroth, MD, PhD [facility] ([facility])  

## 2014-11-28 ENCOUNTER — Encounter: Payer: Self-pay | Admitting: *Deleted

## 2014-11-28 ENCOUNTER — Encounter: Payer: Self-pay | Admitting: Neurology

## 2014-11-28 ENCOUNTER — Telehealth: Payer: Self-pay | Admitting: *Deleted

## 2014-11-28 NOTE — Telephone Encounter (Signed)
Patient was contacted and provided the results of her CPAP Titration Study which was considered effective in treatment.  Patient was referred to [facility] for CPAP set up.  Dr. Adrian Prows was faxed a copy of the report.  The patient gave verbal permission to mail a copy of her test results.   Patient instructed to contact our office 6-8 weeks post set up to schedule a follow up appointment.

## 2015-01-18 ENCOUNTER — Ambulatory Visit (INDEPENDENT_AMBULATORY_CARE_PROVIDER_SITE_OTHER): Payer: BLUE CROSS/BLUE SHIELD | Admitting: Family Medicine

## 2015-01-18 ENCOUNTER — Encounter: Payer: Self-pay | Admitting: Family Medicine

## 2015-01-18 VITALS — BP 110/70 | HR 61 | Temp 98.4°F | Resp 16 | Ht 60.25 in | Wt 156.4 lb

## 2015-01-18 DIAGNOSIS — I159 Secondary hypertension, unspecified: Secondary | ICD-10-CM

## 2015-01-18 DIAGNOSIS — I10 Essential (primary) hypertension: Secondary | ICD-10-CM

## 2015-01-18 DIAGNOSIS — R0789 Other chest pain: Secondary | ICD-10-CM | POA: Diagnosis not present

## 2015-01-18 DIAGNOSIS — N951 Menopausal and female climacteric states: Secondary | ICD-10-CM

## 2015-01-18 DIAGNOSIS — Z9989 Dependence on other enabling machines and devices: Secondary | ICD-10-CM

## 2015-01-18 DIAGNOSIS — G4733 Obstructive sleep apnea (adult) (pediatric): Secondary | ICD-10-CM

## 2015-01-18 LAB — CBC WITH DIFFERENTIAL/PLATELET
BASOS ABS: 0 10*3/uL (ref 0.0–0.1)
Basophils Relative: 0 % (ref 0–1)
Eosinophils Absolute: 0.1 10*3/uL (ref 0.0–0.7)
Eosinophils Relative: 2 % (ref 0–5)
HEMATOCRIT: 39.5 % (ref 36.0–46.0)
Hemoglobin: 13.2 g/dL (ref 12.0–15.0)
Lymphocytes Relative: 35 % (ref 12–46)
Lymphs Abs: 2.4 10*3/uL (ref 0.7–4.0)
MCH: 28.4 pg (ref 26.0–34.0)
MCHC: 33.4 g/dL (ref 30.0–36.0)
MCV: 85.1 fL (ref 78.0–100.0)
MONOS PCT: 6 % (ref 3–12)
MPV: 12.1 fL (ref 8.6–12.4)
Monocytes Absolute: 0.4 10*3/uL (ref 0.1–1.0)
NEUTROS ABS: 3.9 10*3/uL (ref 1.7–7.7)
Neutrophils Relative %: 57 % (ref 43–77)
Platelets: 329 10*3/uL (ref 150–400)
RBC: 4.64 MIL/uL (ref 3.87–5.11)
RDW: 14.1 % (ref 11.5–15.5)
WBC: 6.8 10*3/uL (ref 4.0–10.5)

## 2015-01-18 MED ORDER — ESCITALOPRAM OXALATE 20 MG PO TABS: 20.0000 mg | ORAL_TABLET | Freq: Every day | ORAL | Status: DC

## 2015-01-18 MED ORDER — HYDROCHLOROTHIAZIDE 25 MG PO TABS: 25.0000 mg | ORAL_TABLET | Freq: Every day | ORAL | Status: DC

## 2015-01-18 MED ORDER — AMLODIPINE BESYLATE 5 MG PO TABS: 5.0000 mg | ORAL_TABLET | Freq: Every day | ORAL | Status: DC

## 2015-01-18 NOTE — Progress Notes (Signed)
Subjective:    Patient ID: Lauren Paul, female    DOB: 1966-05-01, 49 y.o.   MRN: [id number]  01/18/2015  Follow-up   HPI This 49 y.o. female presents for six month follow-up of the following:   1. HTN:  Patient reports good compliance with medication, good tolerance to medication, and good symptom control.  Not checking BP at home. Denies CP/palp/SOB/leg swelling.   2.  Hot flashes: worsening lately; interested in increasing Lexapro dose.    3.  Chest pain:  Referred to cardiology after visit with Ivar Drape, PA-C.  S/p echo.  S/p stress testing.  No abnormalities identified.  No recurrent chest pain  4.  OSA: s/p sleep study with midl OSA.  Having a difficult time sleeping with CPAP machine.  Trying to sleep with CPAP for four hours.  More energy.  Starting exercise class twice per week.   [facility].   Review of Systems  Constitutional: Negative for fever, chills, diaphoresis and fatigue.  Eyes: Negative for visual disturbance.  Respiratory: Negative for cough and shortness of breath.   Cardiovascular: Negative for chest pain, palpitations and leg swelling.  Gastrointestinal: Negative for nausea, vomiting, abdominal pain, diarrhea and constipation.  Endocrine: Negative for cold intolerance, heat intolerance, polydipsia, polyphagia and polyuria.  Neurological: Negative for dizziness, tremors, seizures, syncope, facial asymmetry, speech difficulty, weakness, light-headedness, numbness and headaches.    Past Medical History  Diagnosis Date  . Allergy   . Anemia   . Asthma   . Ulcer   . Meningitis   . Sepsis   . Hypertension   . Hypersomnolence 06/19/2014  . Headache     migraines   Past Surgical History  Procedure Laterality Date  . Ablation    . Dilation and curettage of uterus    . Tubal ligation     Allergies  Allergen Reactions  . Benadryl [Diphenhydramine Hcl] Shortness Of Breath    Pt states she can take Childrens Benadryl dye free  fine.  . Latex Swelling  . Penicillins Anaphylaxis    Pt states she can not take anything in the "cillin" family.  Marland Kitchen Apap-Pamabrom-Pyrilamine   . Iodides   . Other     Watermelon- Anaphylaxis Nuts Melon  . Shellfish Allergy   . Sulfa Antibiotics    Current Outpatient Prescriptions  Medication Sig Dispense Refill  . amLODipine (NORVASC) 5 MG tablet Take 1 tablet (5 mg total) by mouth daily. 90 tablet 3  . cyanocobalamin 500 MCG tablet Take 500 mcg by mouth daily.    . hydrochlorothiazide (HYDRODIURIL) 25 MG tablet Take 1 tablet (25 mg total) by mouth daily. 90 tablet 3  . Multiple Vitamin (MULTIVITAMIN) capsule Take 1 capsule by mouth daily.    Marland Kitchen escitalopram (LEXAPRO) 20 MG tablet Take 1 tablet (20 mg total) by mouth daily. 90 tablet 3   Current Facility-Administered Medications  Medication Dose Route Frequency Provider Last Rate Last Dose  . gi cocktail (Maalox,Lidocaine,Donnatal)  30 mL Oral Once Stephanie D English, PA           Objective:    BP 110/70 mmHg  Pulse 61  Temp(Src) 98.4 F (36.9 C) (Oral)  Resp 16  Ht 5' 0.25" (1.53 m)  Wt 156 lb 6.4 oz (70.943 kg)  BMI 30.31 kg/m2  SpO2 100% Physical Exam  Constitutional: She is oriented to person, place, and time. She appears well-developed and well-nourished. No distress.  HENT:  Head: Normocephalic and atraumatic.  Right Ear: External ear  normal.  Left Ear: External ear normal.  Nose: Nose normal.  Mouth/Throat: Oropharynx is clear and moist.  Eyes: Conjunctivae and EOM are normal. Pupils are equal, round, and reactive to light.  Neck: Normal range of motion. Neck supple. Carotid bruit is not present. No thyromegaly present.  Cardiovascular: Normal rate, regular rhythm, normal heart sounds and intact distal pulses.  Exam reveals no gallop and no friction rub.   No murmur heard. Pulmonary/Chest: Effort normal and breath sounds normal. She has no wheezes. She has no rales.  Abdominal: Soft. Bowel sounds are normal.  She exhibits no distension and no mass. There is no tenderness. There is no rebound and no guarding.  Lymphadenopathy:    She has no cervical adenopathy.  Neurological: She is alert and oriented to person, place, and time. No cranial nerve deficit.  Skin: Skin is warm and dry. No rash noted. She is not diaphoretic. No erythema. No pallor.  Psychiatric: She has a normal mood and affect. Her behavior is normal.   Results for orders placed or performed in visit on 24/26/83  BASIC METABOLIC PANEL WITH GFR  Result Value Ref Range   Sodium 134 (L) 135 - 145 mEq/L   Potassium 4.1 3.5 - 5.3 mEq/L   Chloride 104 96 - 112 mEq/L   CO2 25 19 - 32 mEq/L   Glucose, Bld 95 70 - 99 mg/dL   BUN 16 6 - 23 mg/dL   Creat 0.85 0.50 - 1.10 mg/dL   Calcium 9.6 8.4 - 10.5 mg/dL   GFR, Est African American >89 mL/min   GFR, Est Non African American 82 mL/min       Assessment & Plan:   1. Other chest pain   2. Essential hypertension   3. Menopausal syndrome (hot flashes)   4. OSA on CPAP      1. HTN: controlled; obtain labs; refills provided; follow-up six months. 2.  Menopausal syndrome: worsening; increase Lexapro to 20mg  daily. 3.  OSA on CPAP: New.  Energy level improved on CPAP; struggling with tolerating CPAP. 4. Chest pain: resolved; s/p cardiac evaluation with negative work up.    Meds ordered this encounter  Medications  . DISCONTD: amLODipine (NORVASC) 5 MG tablet    Sig: Take 1 tablet (5 mg total) by mouth daily.    Dispense:  30 tablet    Refill:  5  . DISCONTD: hydrochlorothiazide (HYDRODIURIL) 25 MG tablet    Sig: Take 1 tablet (25 mg total) by mouth daily.    Dispense:  30 tablet    Refill:  5  . DISCONTD: escitalopram (LEXAPRO) 20 MG tablet    Sig: Take 1 tablet (20 mg total) by mouth daily.    Dispense:  30 tablet    Refill:  5  . escitalopram (LEXAPRO) 20 MG tablet    Sig: Take 1 tablet (20 mg total) by mouth daily.    Dispense:  90 tablet    Refill:  3  . amLODipine  (NORVASC) 5 MG tablet    Sig: Take 1 tablet (5 mg total) by mouth daily.    Dispense:  90 tablet    Refill:  3  . hydrochlorothiazide (HYDRODIURIL) 25 MG tablet    Sig: Take 1 tablet (25 mg total) by mouth daily.    Dispense:  90 tablet    Refill:  3    Return in about 6 months (around 07/21/2015).     Sylva Overley Elayne Guerin, M.D. [facility]  [address], [place]  [address] [phone] phone [phone] fax

## 2015-01-18 NOTE — Patient Instructions (Signed)
[email or web address].matthews.info/  Hypertension Hypertension, commonly called high blood pressure, is when the force of blood pumping through your arteries is too strong. Your arteries are the blood vessels that carry blood from your heart throughout your body. A blood pressure reading consists of a higher number over a lower number, such as 110/72. The higher number (systolic) is the pressure inside your arteries when your heart pumps. The lower number (diastolic) is the pressure inside your arteries when your heart relaxes. Ideally you want your blood pressure below 120/80. Hypertension forces your heart to work harder to pump blood. Your arteries may become narrow or stiff. Having hypertension puts you at risk for heart disease, stroke, and other problems.  RISK FACTORS Some risk factors for high blood pressure are controllable. Others are not.  Risk factors you cannot control include:   Race. You may be at higher risk if you are African American.  Age. Risk increases with age.  Gender. Men are at higher risk than women before age 73 years. After age 73, women are at higher risk than men. Risk factors you can control include:  Not getting enough exercise or physical activity.  Being overweight.  Getting too much fat, sugar, calories, or salt in your diet.  Drinking too much alcohol. SIGNS AND SYMPTOMS Hypertension does not usually cause signs or symptoms. Extremely high blood pressure (hypertensive crisis) may cause headache, anxiety, shortness of breath, and nosebleed. DIAGNOSIS  To check if you have hypertension, your health care provider will measure your blood pressure while you are seated, with your arm held at the level of your heart. It should be measured at least twice using the same arm. Certain conditions can cause a difference in blood pressure between your right and left arms. A blood pressure reading that is higher than normal on one occasion does not mean that you need treatment. If one  blood pressure reading is high, ask your health care provider about having it checked again. TREATMENT  Treating high blood pressure includes making lifestyle changes and possibly taking medicine. Living a healthy lifestyle can help lower high blood pressure. You may need to change some of your habits. Lifestyle changes may include:  Following the DASH diet. This diet is high in fruits, vegetables, and whole grains. It is low in salt, red meat, and added sugars.  Getting at least 2 hours of brisk physical activity every week.  Losing weight if necessary.  Not smoking.  Limiting alcoholic beverages.  Learning ways to reduce stress. If lifestyle changes are not enough to get your blood pressure under control, your health care provider may prescribe medicine. You may need to take more than one. Work closely with your health care provider to understand the risks and benefits. HOME CARE INSTRUCTIONS  Have your blood pressure rechecked as directed by your health care provider.   Take medicines only as directed by your health care provider. Follow the directions carefully. Blood pressure medicines must be taken as prescribed. The medicine does not work as well when you skip doses. Skipping doses also puts you at risk for problems.   Do not smoke.   Monitor your blood pressure at home as directed by your health care provider. SEEK MEDICAL CARE IF:   You think you are having a reaction to medicines taken.  You have recurrent headaches or feel dizzy.  You have swelling in your ankles.  You have trouble with your vision. SEEK IMMEDIATE MEDICAL CARE IF:  You develop a severe headache  or confusion.  You have unusual weakness, numbness, or feel faint.  You have severe chest or abdominal pain.  You vomit repeatedly.  You have trouble breathing. MAKE SURE YOU:   Understand these instructions.  Will watch your condition.  Will get help right away if you are not doing well or  get worse. Document Released: 10/07/2005 Document Revised: 02/21/2014 Document Reviewed: 07/30/2013 [facility] Patient Information 2015 [place], [place]. This information is not intended to replace advice given to you by your health care provider. Make sure you discuss any questions you have with your health care provider.

## 2015-01-19 LAB — COMPREHENSIVE METABOLIC PANEL
ALK PHOS: 66 U/L (ref 39–117)
ALT: 17 U/L (ref 0–35)
AST: 23 U/L (ref 0–37)
Albumin: 4.5 g/dL (ref 3.5–5.2)
BUN: 16 mg/dL (ref 6–23)
CALCIUM: 9.8 mg/dL (ref 8.4–10.5)
CO2: 22 mEq/L (ref 19–32)
CREATININE: 0.87 mg/dL (ref 0.50–1.10)
Chloride: 100 mEq/L (ref 96–112)
Glucose, Bld: 88 mg/dL (ref 70–99)
POTASSIUM: 3.8 meq/L (ref 3.5–5.3)
Sodium: 139 mEq/L (ref 135–145)
Total Bilirubin: 0.7 mg/dL (ref 0.2–1.2)
Total Protein: 7.9 g/dL (ref 6.0–8.3)

## 2015-04-05 ENCOUNTER — Ambulatory Visit (INDEPENDENT_AMBULATORY_CARE_PROVIDER_SITE_OTHER): Payer: Managed Care, Other (non HMO) | Admitting: Family Medicine

## 2015-04-05 VITALS — BP 106/80 | HR 70 | Temp 98.7°F | Resp 18 | Ht 60.0 in | Wt 157.2 lb

## 2015-04-05 DIAGNOSIS — K921 Melena: Secondary | ICD-10-CM

## 2015-04-05 DIAGNOSIS — K602 Anal fissure, unspecified: Secondary | ICD-10-CM

## 2015-04-05 MED ORDER — HYDROCORTISONE 2.5 % RE CREA: 1.0000 "application " | TOPICAL_CREAM | Freq: Two times a day (BID) | RECTAL | Status: DC

## 2015-04-05 NOTE — Patient Instructions (Signed)
You need to take some Colace 100 mg daily to soften the stool. REM to rinse well after shower to make sure no soap his left because that can dry the skin out and cause further cracking.   Anal Fissure, Adult An anal fissure is a small tear or crack in the skin around the anus. Bleeding from a fissure usually stops on its own within a few minutes. However, bleeding will often reoccur with each bowel movement until the crack heals.  CAUSES   Passing large, hard stools.  Frequent diarrheal stools.  Constipation.  Inflammatory bowel disease (Crohn's disease or ulcerative colitis).  Infections.  Anal sex. SYMPTOMS   Small amounts of blood seen on your stools, on toilet paper, or in the toilet after a bowel movement.  Rectal bleeding.  Painful bowel movements.  Itching or irritation around the anus. DIAGNOSIS Your caregiver will examine the anal area. An anal fissure can usually be seen with careful inspection. A rectal exam may be performed and a short tube (anoscope) may be used to examine the anal canal. TREATMENT   You may be instructed to take fiber supplements. These supplements can soften your stool to help make bowel movements easier.  Sitz baths may be recommended to help heal the tear. Do not use soap in the sitz baths.  A medicated cream or ointment may be prescribed to lessen discomfort. HOME CARE INSTRUCTIONS   Maintain a diet high in fruits, whole grains, and vegetables. Avoid constipating foods like bananas and dairy products.  Take sitz baths as directed by your caregiver.  Drink enough fluids to keep your urine clear or pale yellow.  Only take over-the-counter or prescription medicines for pain, discomfort, or fever as directed by your caregiver. Do not take aspirin as this may increase bleeding.  Do not use ointments containing numbing medications (anesthetics) or hydrocortisone. They could slow healing. SEEK MEDICAL CARE IF:   Your fissure is not  completely healed within 3 days.  You have further bleeding.  You have a fever.  You have diarrhea mixed with blood.  You have pain.  Your problem is getting worse rather than better. MAKE SURE YOU:   Understand these instructions.  Will watch your condition.  Will get help right away if you are not doing well or get worse. Document Released: 10/07/2005 Document Revised: 12/30/2011 Document Reviewed: 03/24/2011 [facility] Patient Information 2015 [place], [place]. This information is not intended to replace advice given to you by your health care provider. Make sure you discuss any questions you have with your health care provider.

## 2015-04-05 NOTE — Progress Notes (Signed)
This a 49 year old woman who works for a [employer]. She comes in having seen a couple clots per rectum this morning.  Patient's problems really began last night which is an episode of diarrhea area she's had some mild cramping as well. She's not had any blood per rectum in the past.  Patient denies any anal pain. She's never had a colonoscopy. She's had no fever. Her graft patient has had an endoscopy in the past which is negative.  Objective: Patient's alert and in no acute distress. BP 106/80 mmHg  Pulse 70  Temp(Src) 98.7 F (37.1 C) (Oral)  Resp 18  Ht 5' (1.524 m)  Wt 157 lb 3.2 oz (71.305 kg)  BMI 30.70 kg/m2  SpO2 98% Abdominal exam reveals no significant tenderness, no guarding or rebound, no mass, no HSM. Rectal exam reveals a tear it 9:00 when patient in the left lateral decubitus position.  Assessment: Anal fissure, cannot rule out other pathology without further investigation  Plan: Referral to gastroenterology, Colace, [facility] CC: Dr. Tamala Julian Signed, Carola Frost.D.

## 2015-04-07 ENCOUNTER — Encounter: Payer: Self-pay | Admitting: Internal Medicine

## 2015-05-19 ENCOUNTER — Telehealth: Payer: Self-pay | Admitting: Family Medicine

## 2015-05-19 NOTE — Telephone Encounter (Signed)
Called patient to schedule her appt

## 2015-05-26 ENCOUNTER — Encounter: Payer: Self-pay | Admitting: *Deleted

## 2015-06-13 ENCOUNTER — Ambulatory Visit: Payer: Self-pay | Admitting: Internal Medicine

## 2015-07-19 ENCOUNTER — Telehealth: Payer: Self-pay | Admitting: Family Medicine

## 2015-07-19 ENCOUNTER — Ambulatory Visit: Payer: BLUE CROSS/BLUE SHIELD | Admitting: Family Medicine

## 2015-07-19 NOTE — Telephone Encounter (Signed)
Patient came into 24 stating she was scheduled to see Dr. Tamala Julian, According to epic her appointment was cancelled. Patient stated she did not cancel her appointment. Patient request to speak with Dr. Tamala Julian. She have other issues to discuss with Dr. Tamala Julian. Patient request for Dr. Tamala Julian to call her after 10 a.m at work. [phone].

## 2015-07-20 NOTE — Telephone Encounter (Signed)
Dr. Tamala Julian, (please see previous message) I spoke with the Pt. She does have a new appt. With you in Nov. But she is still upset that her appt. Was canceled and she really needed to speak with you about new issues with her that has arose and now she has to wait a month.  Her phone number is [phone] She can be reached after 10 am.

## 2015-07-20 NOTE — Telephone Encounter (Signed)
Patient wants to know if Dr. Tamala Julian is going to call her back today. I informed that patient that the message has been sent but I do not know if she can expect a call back today or not.

## 2015-07-21 ENCOUNTER — Ambulatory Visit: Payer: Managed Care, Other (non HMO) | Admitting: Family Medicine

## 2015-07-21 ENCOUNTER — Encounter: Payer: Self-pay | Admitting: Family Medicine

## 2015-07-21 ENCOUNTER — Ambulatory Visit (INDEPENDENT_AMBULATORY_CARE_PROVIDER_SITE_OTHER): Payer: Managed Care, Other (non HMO) | Admitting: Family Medicine

## 2015-07-21 VITALS — BP 134/90 | HR 65 | Temp 98.5°F | Resp 16 | Ht 60.25 in | Wt 156.6 lb

## 2015-07-21 DIAGNOSIS — Z9989 Dependence on other enabling machines and devices: Secondary | ICD-10-CM

## 2015-07-21 DIAGNOSIS — R413 Other amnesia: Secondary | ICD-10-CM | POA: Diagnosis not present

## 2015-07-21 DIAGNOSIS — G4733 Obstructive sleep apnea (adult) (pediatric): Secondary | ICD-10-CM

## 2015-07-21 DIAGNOSIS — I10 Essential (primary) hypertension: Secondary | ICD-10-CM | POA: Diagnosis not present

## 2015-07-21 DIAGNOSIS — N951 Menopausal and female climacteric states: Secondary | ICD-10-CM | POA: Diagnosis not present

## 2015-07-21 DIAGNOSIS — Z114 Encounter for screening for human immunodeficiency virus [HIV]: Secondary | ICD-10-CM | POA: Diagnosis not present

## 2015-07-21 LAB — CBC WITH DIFFERENTIAL/PLATELET
BASOS ABS: 0 10*3/uL (ref 0.0–0.1)
Basophils Relative: 0 % (ref 0–1)
Eosinophils Absolute: 0.2 10*3/uL (ref 0.0–0.7)
Eosinophils Relative: 3 % (ref 0–5)
HEMATOCRIT: 39.9 % (ref 36.0–46.0)
Hemoglobin: 13.5 g/dL (ref 12.0–15.0)
LYMPHS PCT: 36 % (ref 12–46)
Lymphs Abs: 2.1 10*3/uL (ref 0.7–4.0)
MCH: 28.8 pg (ref 26.0–34.0)
MCHC: 33.8 g/dL (ref 30.0–36.0)
MCV: 85.1 fL (ref 78.0–100.0)
MONO ABS: 0.4 10*3/uL (ref 0.1–1.0)
MPV: 12.4 fL (ref 8.6–12.4)
Monocytes Relative: 6 % (ref 3–12)
Neutro Abs: 3.2 10*3/uL (ref 1.7–7.7)
Neutrophils Relative %: 55 % (ref 43–77)
Platelets: 297 10*3/uL (ref 150–400)
RBC: 4.69 MIL/uL (ref 3.87–5.11)
RDW: 13.7 % (ref 11.5–15.5)
WBC: 5.9 10*3/uL (ref 4.0–10.5)

## 2015-07-21 LAB — COMPREHENSIVE METABOLIC PANEL
ALBUMIN: 4.3 g/dL (ref 3.6–5.1)
ALK PHOS: 67 U/L (ref 33–115)
ALT: 13 U/L (ref 6–29)
AST: 20 U/L (ref 10–35)
BILIRUBIN TOTAL: 0.8 mg/dL (ref 0.2–1.2)
BUN: 19 mg/dL (ref 7–25)
CALCIUM: 9.8 mg/dL (ref 8.6–10.2)
CO2: 26 mmol/L (ref 20–31)
CREATININE: 0.67 mg/dL (ref 0.50–1.10)
Chloride: 100 mmol/L (ref 98–110)
Glucose, Bld: 90 mg/dL (ref 65–99)
Potassium: 3.5 mmol/L (ref 3.5–5.3)
Sodium: 138 mmol/L (ref 135–146)
TOTAL PROTEIN: 7.4 g/dL (ref 6.1–8.1)

## 2015-07-21 LAB — VITAMIN B12: VITAMIN B 12: 695 pg/mL (ref 211–911)

## 2015-07-21 LAB — TSH: TSH: 0.464 u[IU]/mL (ref 0.350–4.500)

## 2015-07-21 MED ORDER — TRAZODONE HCL 50 MG PO TABS: 50.0000 mg | ORAL_TABLET | Freq: Every evening | ORAL | Status: DC | PRN

## 2015-07-21 MED ORDER — ESCITALOPRAM OXALATE 10 MG PO TABS: 5.0000 mg | ORAL_TABLET | Freq: Every day | ORAL | Status: DC

## 2015-07-21 NOTE — Telephone Encounter (Signed)
Spoke with patient --- 1.  Appointment cancelled and pt not notified.  2. Having cognitive issues lately with decreased memory; worried about medication side effect versus recent sepsis.  A/P:  Cognitive decline: offered appointment today at 1:30pm.

## 2015-07-21 NOTE — Telephone Encounter (Signed)
Dr. Tamala Julian can we fit pt in earlier?

## 2015-07-21 NOTE — Patient Instructions (Signed)
1. Decrease Lexapro 20mg  to 1/2 tablet daily for one month.  THEN 2.  Start Lexapro 10mg  1/2 tablet daily for one month and then STOP. 3. Start Trazodone 50-100mg  at bedtime; take 30-60 minutes before wanting to go to sleep. 4.  Try CPAP again.

## 2015-07-21 NOTE — Progress Notes (Signed)
Subjective:    Patient ID: Lauren Paul, female    DOB: 04/09/1966, 49 y.o.   MRN: [id number]  07/21/2015  Follow-up   HPI This 49 y.o. female presents for six month follow-up of the following:   1. HTN: Patient reports good compliance with medication, good tolerance to medication, and good symptom control.  Not checking BP at home.  Has been taking Amlodipine and HCTZ in past year.   2.  Menopausal syndrome:  Lexapro increased to 20mg  daily at last visit.  No energy; cannot lose weight.  No depression or anxiety; deals with [employer] and [employer].  Mood is good.  Job is stressful but coping well.  Lexapro started 4 years ago.  Hot flashes seem similar to coworkers.  No surges.    3. Cognitive issues:  Onset since last visit.  At work, needs to complete task; taking forever to complete tasks; cannot remember step of things has done for years.  Onset one month.  No personal stressors.  Mother's health is declining; has MS.    4. Need for HIV screening: per CDC guidelines.    5. Fatigue/lack of energy: no energy; hypersomnolence.  S/p sleep study; cannot wear CPAP; cannot tolerate it.  Still has CPAP.  6.  OSA on CPAP:  Has tried several different masks x 4; willing to try medication for sleep.  No energy to exercise.     Review of Systems  Constitutional: Positive for fatigue. Negative for fever, chills and diaphoresis.  Eyes: Negative for visual disturbance.  Respiratory: Negative for cough and shortness of breath.   Cardiovascular: Negative for chest pain, palpitations and leg swelling.  Gastrointestinal: Negative for nausea, vomiting, abdominal pain, diarrhea and constipation.  Endocrine: Negative for cold intolerance, heat intolerance, polydipsia, polyphagia and polyuria.  Neurological: Negative for dizziness, tremors, seizures, syncope, facial asymmetry, speech difficulty, weakness, light-headedness, numbness and headaches.  Psychiatric/Behavioral: Negative for suicidal ideas,  sleep disturbance, self-injury, dysphoric mood and decreased concentration. The patient is not nervous/anxious.     Past Medical History  Diagnosis Date  . Allergy   . Anemia   . Asthma   . Ulcer   . Meningitis   . Sepsis   . Hypertension   . Hypersomnolence 06/19/2014  . Headache     migraines  . Rectal fissure    Past Surgical History  Procedure Laterality Date  . Ablation    . Dilation and curettage of uterus    . Tubal ligation     Allergies  Allergen Reactions  . Benadryl [Diphenhydramine Hcl] Shortness Of Breath    Pt states she can take Childrens Benadryl dye free fine.  . Latex Swelling  . Penicillins Anaphylaxis    Pt states she can not take anything in the "cillin" family.  Marland Kitchen Apap-Pamabrom-Pyrilamine   . Iodides   . Other     Watermelon- Anaphylaxis Nuts Melon  . Shellfish Allergy   . Sulfa Antibiotics     Social History   Social History  . Marital Status: Single    Spouse Name: N/A  . Number of Children: 0  . Years of Education: 12   Occupational History  .      [employer]   Social History Main Topics  . Smoking status: Never Smoker   . Smokeless tobacco: Never Used  . Alcohol Use: 1.2 oz/week    2 Standard drinks or equivalent per week     Comment: 2-3 glasses of wkly  . Drug Use: No  .  Sexual Activity: Not on file   Other Topics Concern  . Not on file   Social History Narrative   Marital status:  Single; not dating.      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      Tobacco:  None      Alcohol:  Weekends; 2 glasses of wine weekly      Exercise:  Sporadic. One dup of caffeine daily.   Family History  Problem Relation Age of Onset  . Bladder Cancer Mother 64  . Lung cancer Father   . Hypertension Sister   . Uterine cancer Mother        Objective:    BP 134/90 mmHg  Pulse 65  Temp(Src) 98.5 F (36.9 C) (Oral)  Resp 16  Ht 5' 0.25"  (1.53 m)  Wt 156 lb 9.6 oz (71.033 kg)  BMI 30.34 kg/m2 Physical Exam  Constitutional: She is oriented to person, place, and time. She appears well-developed and well-nourished. No distress.  HENT:  Head: Normocephalic and atraumatic.  Right Ear: External ear normal.  Left Ear: External ear normal.  Nose: Nose normal.  Mouth/Throat: Oropharynx is clear and moist.  Eyes: Conjunctivae and EOM are normal. Pupils are equal, round, and reactive to light.  Neck: Normal range of motion. Neck supple. Carotid bruit is not present. No thyromegaly present.  Cardiovascular: Normal rate, regular rhythm, normal heart sounds and intact distal pulses.  Exam reveals no gallop and no friction rub.   No murmur heard. Pulmonary/Chest: Effort normal and breath sounds normal. She has no wheezes. She has no rales.  Abdominal: Soft. Bowel sounds are normal. She exhibits no distension and no mass. There is no tenderness. There is no rebound and no guarding.  Lymphadenopathy:    She has no cervical adenopathy.  Neurological: She is alert and oriented to person, place, and time. No cranial nerve deficit. She exhibits normal muscle tone. Coordination normal.  Skin: Skin is warm and dry. No rash noted. She is not diaphoretic. No erythema. No pallor.  Psychiatric: She has a normal mood and affect. Her behavior is normal. Judgment and thought content normal.  MMSE 30/30.        Assessment & Plan:   1. Essential hypertension, benign   2. Menopausal syndrome   3. Memory difficulties   4. Screening for HIV (human immunodeficiency virus)   5. OSA on CPAP    1. HTN: controlled; obtain labs; continue current medications. 2.  Menopausal syndrome: controlled with Lexapro but suffering with fatigue and hypersomnolence on Lexapro 20mg  daily.  Will wean Lexapro to off at this time.  Consider addition of Effexor in the future for hot flashes. 3.  Memory difficulties: New. Onset with increase in Lexapro dose.  Will wean  Lexapro and reevaluate.  Will also obtain labs and encourage compliance with CPAP. 5.  OSA: non-compliant with CPAP: treat with Trazodone qhs to see if tolerates CPAP better. 6. Screening HIV: obtain HIV.    Orders Placed This Encounter  Procedures  . CBC with Differential/Platelet  . Comprehensive metabolic panel  . TSH  . Vitamin B12  . RPR  . HIV antibody   Meds ordered this encounter  Medications  . DISCONTD: escitalopram (LEXAPRO) 10 MG tablet    Sig: Take 0.5 tablets (5 mg total) by mouth daily.    Dispense:  15 tablet  Refill:  0  . traZODone (DESYREL) 50 MG tablet    Sig: Take 1-2 tablets (50-100 mg total) by mouth at bedtime as needed for sleep.    Dispense:  45 tablet    Refill:  3    Return in about 3 months (around 10/20/2015) for recheck.   Tyann Niehaus Elayne Guerin, M.D. [facility] [address] [place], [place]  [address] [phone] phone [phone] fax

## 2015-07-22 LAB — HIV ANTIBODY (ROUTINE TESTING W REFLEX): HIV 1&2 Ab, 4th Generation: NONREACTIVE

## 2015-07-22 LAB — RPR

## 2015-08-12 ENCOUNTER — Other Ambulatory Visit: Payer: Self-pay | Admitting: Family Medicine

## 2015-08-14 ENCOUNTER — Telehealth: Payer: Self-pay | Admitting: Family Medicine

## 2015-08-14 NOTE — Telephone Encounter (Signed)
No answer no voice box set up i called at 6:19 on 08/14/15 please let pt know that her appt date has been cancelled please reschedule for a OV if patient calls back

## 2015-08-28 ENCOUNTER — Ambulatory Visit: Payer: Managed Care, Other (non HMO) | Admitting: Family Medicine

## 2015-10-09 ENCOUNTER — Other Ambulatory Visit: Payer: Self-pay | Admitting: Family Medicine

## 2015-10-20 ENCOUNTER — Ambulatory Visit: Payer: Self-pay | Admitting: Family Medicine

## 2015-10-21 ENCOUNTER — Other Ambulatory Visit: Payer: Self-pay | Admitting: Family Medicine

## 2015-10-23 ENCOUNTER — Ambulatory Visit: Payer: Managed Care, Other (non HMO) | Admitting: Family Medicine

## 2015-11-08 ENCOUNTER — Other Ambulatory Visit: Payer: Self-pay | Admitting: Family Medicine

## 2015-12-19 ENCOUNTER — Other Ambulatory Visit: Payer: Self-pay | Admitting: Family Medicine

## 2015-12-29 ENCOUNTER — Telehealth: Payer: Self-pay

## 2015-12-29 NOTE — Telephone Encounter (Signed)
Pt is needing to talk with dr Tamala Julian about side effects she feels might be coming from amlodipine  She is having issues with teeth, tired all the time, nauseated, and eyes dry  Best number [phone]

## 2015-12-29 NOTE — Telephone Encounter (Signed)
Spoke with pt, she states she had to get braces because her teeth was so loose and she feels the Amlodipine is causing her all of these symptoms. She staes she has an appointment with you but does not want to wait that long to get advice about this. Please advise.

## 2015-12-29 NOTE — Telephone Encounter (Signed)
Call --- if patient is concerned that Amlodipine is causing her symptoms, recommend decreasing Amlodipine to 1/2 tablet daily for one week and then stopping.  She should check blood pressure daily.

## 2016-01-01 ENCOUNTER — Other Ambulatory Visit: Payer: Self-pay | Admitting: Family Medicine

## 2016-01-01 NOTE — Telephone Encounter (Signed)
Advised pt she can decrease Amlodipine to 1/2 tablet daily for one week then stop. Advised pt to take BP every day to make sure it is not getting too high.

## 2016-01-10 ENCOUNTER — Telehealth: Payer: Self-pay

## 2016-01-10 NOTE — Telephone Encounter (Signed)
Patient called to inform Dr Tamala Julian that she has now stopped taking the amlodipine as instructed. She wanted to notify Dr Tamala Julian that her blood pressure has consistently been elevated since then.  CB#: [phone]

## 2016-01-11 ENCOUNTER — Telehealth: Payer: Self-pay

## 2016-01-11 MED ORDER — LOSARTAN POTASSIUM 50 MG PO TABS: 50.0000 mg | ORAL_TABLET | Freq: Every day | ORAL | Status: DC

## 2016-01-11 NOTE — Telephone Encounter (Signed)
Yes take it together in the am.

## 2016-01-11 NOTE — Telephone Encounter (Signed)
Patient called - I read the message to her.  She will pick up the prescription at [facility].

## 2016-01-11 NOTE — Telephone Encounter (Signed)
Call --- I have sent in Losartan 50mg  one tablet daily for high blood pressure.  This medication will take 2-3 weeks to take full affect.

## 2016-01-11 NOTE — Telephone Encounter (Signed)
I believe to take both, correct?

## 2016-01-11 NOTE — Telephone Encounter (Signed)
Patient called stated do she need to take the  prescription of Hydrochlorothiazide 25 mg along with the Losartan or take it  Separate????? [phone]

## 2016-01-12 NOTE — Telephone Encounter (Signed)
Returned call to pt.  Advised to take together in am HCTZ & Losartan as msg per Judson Roch.

## 2016-01-13 ENCOUNTER — Other Ambulatory Visit: Payer: Self-pay | Admitting: Family Medicine

## 2016-01-24 ENCOUNTER — Other Ambulatory Visit: Payer: Self-pay | Admitting: Family Medicine

## 2016-01-30 ENCOUNTER — Encounter: Payer: Self-pay | Admitting: Family Medicine

## 2016-01-30 ENCOUNTER — Ambulatory Visit (INDEPENDENT_AMBULATORY_CARE_PROVIDER_SITE_OTHER): Payer: Managed Care, Other (non HMO) | Admitting: Family Medicine

## 2016-01-30 VITALS — BP 102/76 | HR 58 | Temp 97.3°F | Resp 16 | Ht 60.5 in | Wt 163.2 lb

## 2016-01-30 DIAGNOSIS — Z0000 Encounter for general adult medical examination without abnormal findings: Secondary | ICD-10-CM | POA: Diagnosis not present

## 2016-01-30 DIAGNOSIS — Z13220 Encounter for screening for lipoid disorders: Secondary | ICD-10-CM

## 2016-01-30 DIAGNOSIS — I10 Essential (primary) hypertension: Secondary | ICD-10-CM | POA: Diagnosis not present

## 2016-01-30 DIAGNOSIS — Z131 Encounter for screening for diabetes mellitus: Secondary | ICD-10-CM

## 2016-01-30 DIAGNOSIS — R5382 Chronic fatigue, unspecified: Secondary | ICD-10-CM

## 2016-01-30 LAB — POCT URINALYSIS DIP (MANUAL ENTRY)
Bilirubin, UA: NEGATIVE
GLUCOSE UA: NEGATIVE
Ketones, POC UA: NEGATIVE
LEUKOCYTES UA: NEGATIVE
Nitrite, UA: NEGATIVE
PH UA: 6.5
PROTEIN UA: NEGATIVE
RBC UA: NEGATIVE
Spec Grav, UA: 1.015
UROBILINOGEN UA: 0.2

## 2016-01-30 LAB — LIPID PANEL
Cholesterol: 190 mg/dL (ref 125–200)
HDL: 54 mg/dL (ref >=46)
LDL Cholesterol: 108 mg/dL (ref <130)
Total CHOL/HDL Ratio: 3.5 Ratio (ref <=5.0)
Triglycerides: 139 mg/dL (ref <150)
VLDL: 28 mg/dL (ref <30)

## 2016-01-30 LAB — HEMOGLOBIN A1C
HEMOGLOBIN A1C: 5.7 % — AB (ref <5.7)
MEAN PLASMA GLUCOSE: 117 mg/dL

## 2016-01-30 MED ORDER — ESCITALOPRAM OXALATE 10 MG PO TABS: 10.0000 mg | ORAL_TABLET | Freq: Every day | ORAL | Status: DC

## 2016-01-30 MED ORDER — HYDROCHLOROTHIAZIDE 25 MG PO TABS: 25.0000 mg | ORAL_TABLET | Freq: Every day | ORAL | Status: DC

## 2016-01-30 MED ORDER — IRBESARTAN 75 MG PO TABS: 75.0000 mg | ORAL_TABLET | Freq: Every day | ORAL | Status: DC

## 2016-01-30 NOTE — Progress Notes (Signed)
Subjective:    Patient ID: Lauren Paul, female    DOB: 03-04-1966, 50 y.o.   MRN: [id number]  01/30/2016  Annual Exam and Medication Refill   HPI This 50 y.o. female presents for Complete Physical Examination.  Last physical: not sure Pap smear:  10-21-2013; 2017 [facility]; WNL. Mammogram:  08-21-2013; ready to schedule.  [place]. Colonoscopy:  Never; too young Bone density: never; too young TDAP:  2015 Pneumovax: never Influenza: never/refuses Eye exam: +glasses; 2017 Dental exam:  Braces; follow-up all the time.   HTN: had to stop Amlodipine and started having dental and gum issues. Intolerant to Amlodipine.  HCTZ 25mg  daily.  Home BP running 98-138s.    Hyperthyroidism: detected by OB/GYN; no thyroid US.  Evaluated by Jeanann Lewandowsky, MD of endocrinology on 12/26/15; CBC, CMET, thyroid studies, iron studies, B12 and Magnesium normal.  All labs normal.  Follow-up in six months.    OSA and CPAP: not wearing CPAP: tried four masks and cannot tolerate mask. Not sleeping well.  Can only wear nasal pillows.   Fatigue: taking Lexapro to 5mg  daily; no improvement.  Having hot flashes 3-4 times per week.  Insomnia: taking Trazodone qhs.     Review of Systems  Constitutional: Positive for fatigue. Negative for fever, chills, diaphoresis, activity change, appetite change and unexpected weight change.  HENT: Positive for dental problem. Negative for congestion, drooling, ear discharge, ear pain, facial swelling, hearing loss, mouth sores, nosebleeds, postnasal drip, rhinorrhea, sinus pressure, sneezing, sore throat, tinnitus, trouble swallowing and voice change.   Eyes: Negative for photophobia, pain, discharge, redness, itching and visual disturbance.  Respiratory: Positive for shortness of breath. Negative for apnea, cough, choking, chest tightness, wheezing and stridor.   Cardiovascular: Positive for palpitations. Negative for chest pain and leg swelling.    Gastrointestinal: Positive for nausea. Negative for vomiting, abdominal pain, diarrhea, constipation, blood in stool, abdominal distention, anal bleeding and rectal pain.  Endocrine: Positive for polyphagia. Negative for cold intolerance, heat intolerance, polydipsia and polyuria.  Genitourinary: Negative for dysuria, urgency, frequency, hematuria, flank pain, decreased urine volume, vaginal bleeding, vaginal discharge, enuresis, difficulty urinating, genital sores, vaginal pain, menstrual problem, pelvic pain and dyspareunia.       Nocturia x several; no incontinence.  Musculoskeletal: Positive for myalgias. Negative for back pain, joint swelling, arthralgias, gait problem, neck pain and neck stiffness.  Skin: Negative for color change, pallor, rash and wound.  Allergic/Immunologic: Positive for food allergies. Negative for environmental allergies and immunocompromised state.  Neurological: Positive for light-headedness and headaches. Negative for dizziness, tremors, seizures, syncope, facial asymmetry, speech difficulty, weakness and numbness.  Hematological: Negative for adenopathy. Does not bruise/bleed easily.  Psychiatric/Behavioral: Positive for sleep disturbance. Negative for suicidal ideas, hallucinations, behavioral problems, confusion, self-injury, dysphoric mood, decreased concentration and agitation. The patient is not nervous/anxious and is not hyperactive.     Past Medical History  Diagnosis Date  . Allergy   . Anemia   . Asthma   . Ulcer   . Meningitis   . Sepsis ([place])   . Hypertension   . Hypersomnolence 06/19/2014  . Headache     migraines  . Rectal fissure    Past Surgical History  Procedure Laterality Date  . Ablation    . Dilation and curettage of uterus    . Tubal ligation     Allergies  Allergen Reactions  . Benadryl [Diphenhydramine Hcl] Shortness Of Breath    Pt states she can take Childrens Benadryl  dye free fine.  . Latex Swelling  . Penicillins  Anaphylaxis    Pt states she can not take anything in the "cillin" family.  Marland Kitchen Apap-Pamabrom-Pyrilamine   . Iodides   . Losartan Other (See Comments)    LEG CRAMPS  . Other     Watermelon- Anaphylaxis Nuts Melon  . Shellfish Allergy   . Sulfa Antibiotics    Current Outpatient Prescriptions  Medication Sig Dispense Refill  . escitalopram (LEXAPRO) 10 MG tablet Take 1 tablet (10 mg total) by mouth daily. 90 tablet 1  . hydrochlorothiazide (HYDRODIURIL) 25 MG tablet Take 1 tablet (25 mg total) by mouth daily. 90 tablet 1  . losartan (COZAAR) 50 MG tablet Take 1 tablet (50 mg total) by mouth daily. 30 tablet 1  . Multiple Vitamin (MULTIVITAMIN) capsule Take 1 capsule by mouth daily.    . traZODone (DESYREL) 50 MG tablet TAKE 1-2 TABLETS (50-100 MG TOTAL) BY MOUTH AT BEDTIME AS NEEDED FOR SLEEP 45 tablet 0  . irbesartan (AVAPRO) 75 MG tablet Take 1 tablet (75 mg total) by mouth daily. 30 tablet 5   Current Facility-Administered Medications  Medication Dose Route Frequency Provider Last Rate Last Dose  . gi cocktail (Maalox,Lidocaine,Donnatal)  30 mL Oral Once Joretta Bachelor, PA       Social History   Social History  . Marital Status: Single    Spouse Name: N/A  . Number of Children: 0  . Years of Education: 12   Occupational History  . CLERICAL     [employer]   Social History Main Topics  . Smoking status: Never Smoker   . Smokeless tobacco: Never Used  . Alcohol Use: 1.2 oz/week    2 Standard drinks or equivalent per week     Comment: 2-3 glasses of wkly  . Drug Use: No  . Sexual Activity: Not on file   Other Topics Concern  . Not on file   Social History Narrative   Marital status:  Single; not dating.  Not interested.      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      Tobacco:  None      Alcohol:  Weekends; 3 glasses of wine weekly      Exercise:  Sporadic.        Caffeine: One cup of caffeine daily.   Family History  Problem Relation Age of Onset  . Bladder Cancer Mother 14  . Uterine cancer Mother   . Cancer Mother     Uterine and bladder cancer  . Hypertension Mother   . Multiple sclerosis Mother   . Lung cancer Father   . Cancer Father 75    lung cancer  . Hypertension Sister        Objective:    BP 102/76 mmHg  Pulse 58  Temp(Src) 97.3 F (36.3 C) (Oral)  Resp 16  Ht 5' 0.5" (1.537 m)  Wt 163 lb 3.2 oz (74.027 kg)  BMI 31.34 kg/m2  SpO2 97% Physical Exam  Constitutional: She is oriented to person, place, and time. She appears well-developed and well-nourished. No distress.  HENT:  Head: Normocephalic and atraumatic.  Right Ear: External ear normal.  Left Ear: External ear normal.  Nose: Nose normal.  Mouth/Throat: Oropharynx is clear and moist.  Eyes: Conjunctivae and EOM are normal. Pupils are equal, round, and reactive  to light.  Neck: Normal range of motion and full passive range of motion without pain. Neck supple. No JVD present. Carotid bruit is not present. No thyromegaly present.  Cardiovascular: Normal rate, regular rhythm and normal heart sounds.  Exam reveals no gallop and no friction rub.   No murmur heard. Pulmonary/Chest: Effort normal and breath sounds normal. She has no wheezes. She has no rales.  Abdominal: Soft. Bowel sounds are normal. She exhibits no distension and no mass. There is no tenderness. There is no rebound and no guarding.  Musculoskeletal:       Right shoulder: Normal.       Left shoulder: Normal.       Cervical back: Normal.  Lymphadenopathy:    She has no cervical adenopathy.  Neurological: She is alert and oriented to person, place, and time. She has normal reflexes. No cranial nerve deficit. She exhibits normal muscle tone. Coordination normal.  Skin: Skin is warm and dry. No rash noted. She is not diaphoretic. No erythema. No pallor.  Psychiatric: She has a normal mood and affect. Her  behavior is normal. Judgment and thought content normal.  Nursing note and vitals reviewed.       Assessment & Plan:   1. Routine physical examination   2. Essential hypertension, benign   3. Screening for diabetes mellitus   4. Screening, lipid   5. Chronic fatigue     Orders Placed This Encounter  Procedures  . Hemoglobin A1c  . Lipid panel    Order Specific Question:  Has the patient fasted?    Answer:  Yes  . POCT urinalysis dipstick  . EKG 12-Lead   Meds ordered this encounter  Medications  . escitalopram (LEXAPRO) 10 MG tablet    Sig: Take 1 tablet (10 mg total) by mouth daily.    Dispense:  90 tablet    Refill:  1  . hydrochlorothiazide (HYDRODIURIL) 25 MG tablet    Sig: Take 1 tablet (25 mg total) by mouth daily.    Dispense:  90 tablet    Refill:  1  . irbesartan (AVAPRO) 75 MG tablet    Sig: Take 1 tablet (75 mg total) by mouth daily.    Dispense:  30 tablet    Refill:  5    Return in about 6 months (around 07/31/2016) for recheck high blood pressure.    Kristi Elayne Guerin, M.D. [facility] [address] [place], [place]  [address] [phone] phone [phone] fax

## 2016-01-30 NOTE — Patient Instructions (Addendum)
1.  Stop Losartan.  (Start Irbesartan only if BP consistently > 130/80. ) 2. Goal blood pressure between 110/60-130/80.   3. Start being concerned if BP > 140/90 with most checks.    IF you received an x-ray today, you will receive an invoice from [facility]. Please contact [facility] at [phone] with questions or concerns regarding your invoice.   IF you received labwork today, you will receive an invoice from [employer]. Please contact [facility] at [phone] with questions or concerns regarding your invoice.   Our billing staff will not be able to assist you with questions regarding bills from these companies.  You will be contacted with the lab results as soon as they are available. The fastest way to get your results is to activate your My Chart account. Instructions are located on the last page of this paperwork. If you have not heard from [place] regarding the results in 2 weeks, please contact this office.

## 2016-02-14 ENCOUNTER — Other Ambulatory Visit: Payer: Self-pay | Admitting: Family Medicine

## 2016-03-26 ENCOUNTER — Other Ambulatory Visit: Payer: Self-pay | Admitting: Family Medicine

## 2016-04-01 ENCOUNTER — Other Ambulatory Visit: Payer: Self-pay

## 2016-04-01 IMAGING — CR DG CHEST 2V
2 series · 2 of 2 positions shown · non-contrast
Comparison: Chest radiograph and chest CT February 14, 2014

CLINICAL DATA: Difficulty breathing and hypertension

EXAM:
CHEST  2 VIEW

[PA]
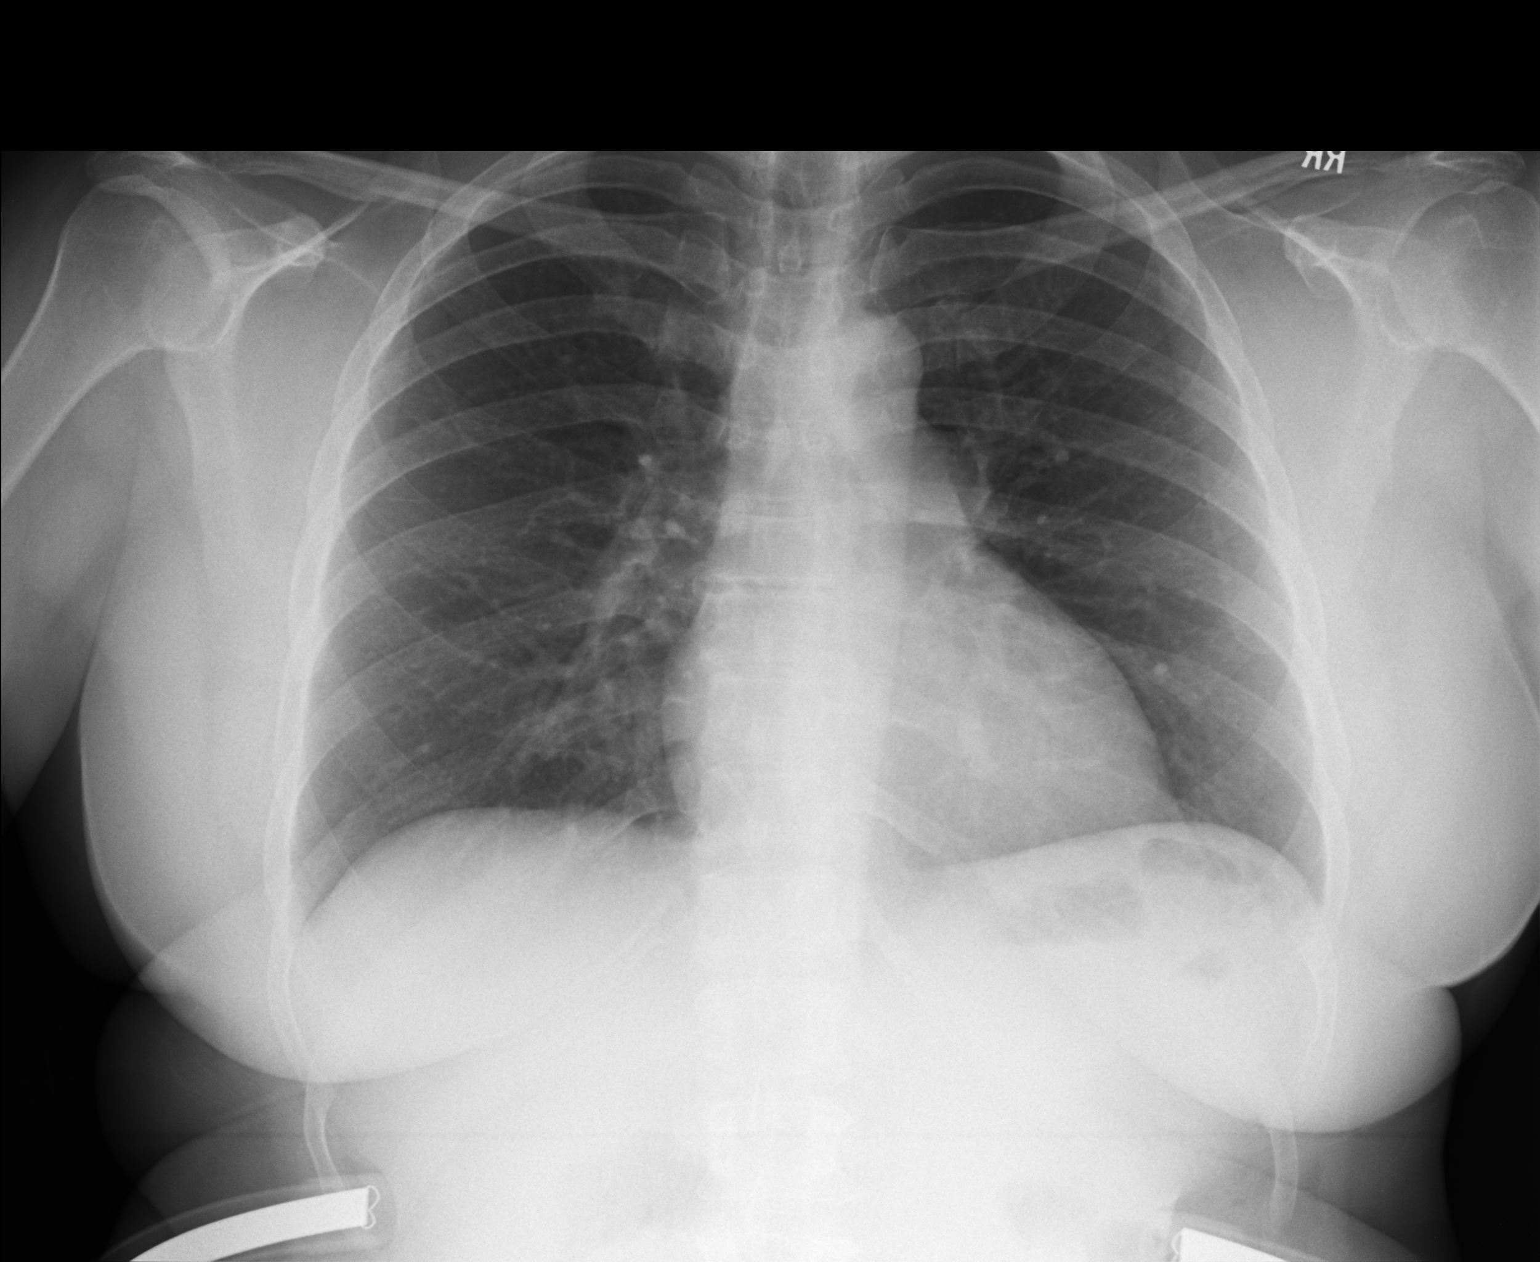

[lateral]
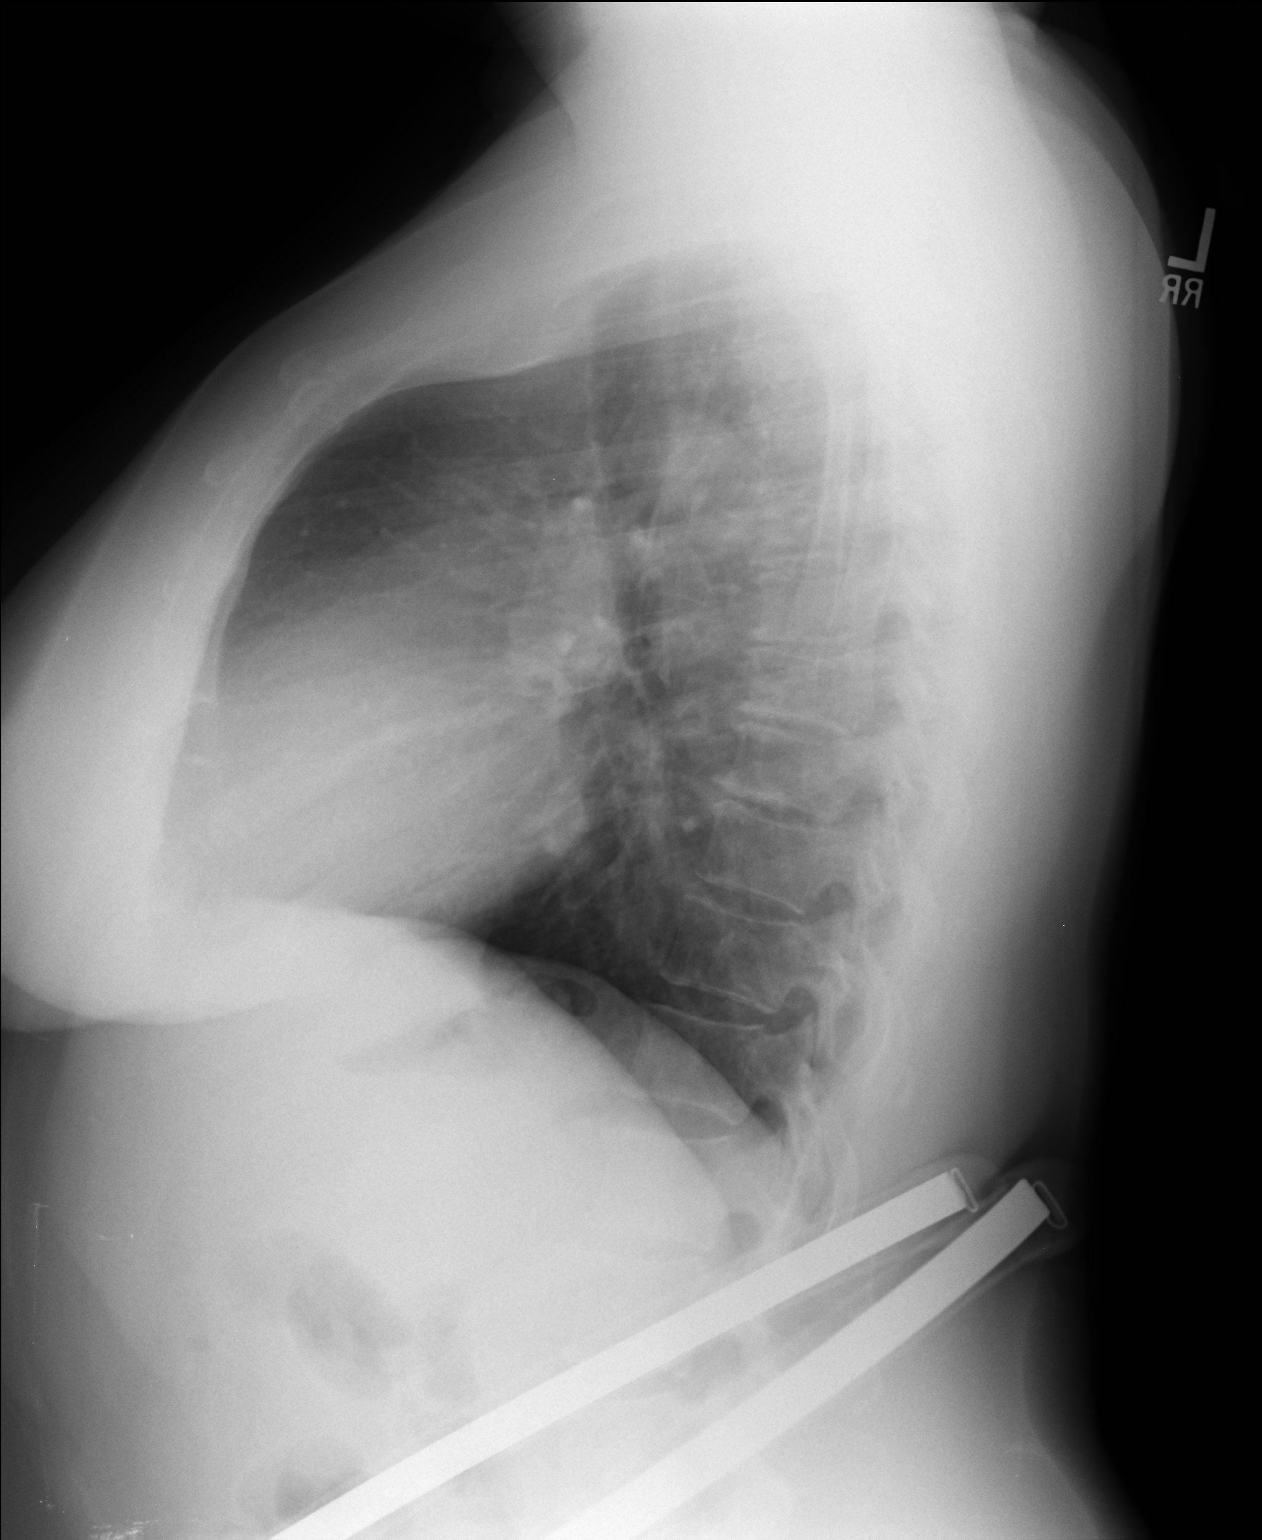

[2 of 2 positions shown; findings below may reference images not displayed]

FINDINGS: Lungs are clear. Heart size and pulmonary vascularity are normal. No
adenopathy. No pneumothorax. No bone lesions.
IMPRESSION: No edema or consolidation.

## 2016-04-01 MED ORDER — ESCITALOPRAM OXALATE 20 MG PO TABS: 20.0000 mg | ORAL_TABLET | Freq: Every day | ORAL | Status: DC

## 2016-04-01 NOTE — Telephone Encounter (Signed)
Pt called because she had requested Lexapro Rx at increased dose from pharm. It looks like we had denied the req because pharm had already been sent the Rx at 01/30/16 OV. HOWEVER, pt states that the new increase was supposed to be to the 20 mg strength, take 1 QD. In reading notes from 01/30/16 OV, Dr Tamala Julian had put in her subj data that pt was on the 10 mg, but taking 1/2 tab QD. This was incorrect. Pt reports (and the med list at beginning of OV showed dose of 10 mg tabs, 1 tab QD) she was taking a whole tab of the 10 mg when she came for that OV. Pt was having break through hot flashes so Dr Tamala Julian was trying to increase her dose to 20 mg QD. Pt has been just doubling up on her 10 mg tabs she had left, but is now out and is going out of town tomorrow. Is it OK to send in the 20 mg Rx? Please review in Dr Thompson Caul absence.

## 2016-04-01 NOTE — Telephone Encounter (Signed)
Meds ordered this encounter  Medications  . escitalopram (LEXAPRO) 20 MG tablet    Sig: Take 1 tablet (20 mg total) by mouth daily.    Dispense:  90 tablet    Refill:  1

## 2016-04-01 NOTE — Telephone Encounter (Signed)
Called pt to advise Rx was sent.

## 2016-06-18 ENCOUNTER — Telehealth: Payer: Self-pay

## 2016-06-18 NOTE — Telephone Encounter (Signed)
Pt checking on status of this message. She really needs a CB today because the bone graft will fail if she is not put on a new antibiotic. She would like [place] to use [facility] [address] in [place]. Please advise at [phone]

## 2016-06-18 NOTE — Telephone Encounter (Signed)
Patient stated she went to the dentist yesterday Dr. Zenaida Niece. Patient stated she can not take amoxicillin and cephalexin. Doctor states she have to be on an antibiotic or the bone graft will fail. Patient want Dr. Tamala Julian to suggest an antibiotic she can take. [phone].

## 2016-06-21 MED ORDER — CLINDAMYCIN HCL 150 MG PO CAPS: 150.0000 mg | ORAL_CAPSULE | Freq: Three times a day (TID) | ORAL | 0 refills | Status: DC

## 2016-06-21 NOTE — Telephone Encounter (Signed)
Please call --- I sent in Clindamycin to local pharmacy for patient to take.

## 2016-06-25 ENCOUNTER — Telehealth: Payer: Self-pay | Admitting: Emergency Medicine

## 2016-06-25 NOTE — Telephone Encounter (Signed)
Dr. Tamala Julian, I notified patient that the medication was sent to pharmacy. Pt stated, we took too long to return call, so Dr. Zenaida Niece prescribed Keflex to prevent bone graft from rejecting I will cancel Clindamycin order.

## 2016-08-06 ENCOUNTER — Ambulatory Visit (INDEPENDENT_AMBULATORY_CARE_PROVIDER_SITE_OTHER): Payer: Managed Care, Other (non HMO) | Admitting: Family Medicine

## 2016-08-06 ENCOUNTER — Encounter: Payer: Self-pay | Admitting: Family Medicine

## 2016-08-06 VITALS — BP 110/70 | HR 68 | Temp 98.4°F | Resp 18 | Ht 60.5 in | Wt 167.0 lb

## 2016-08-06 DIAGNOSIS — I10 Essential (primary) hypertension: Secondary | ICD-10-CM

## 2016-08-06 DIAGNOSIS — N951 Menopausal and female climacteric states: Secondary | ICD-10-CM | POA: Diagnosis not present

## 2016-08-06 DIAGNOSIS — G4733 Obstructive sleep apnea (adult) (pediatric): Secondary | ICD-10-CM

## 2016-08-06 DIAGNOSIS — R7302 Impaired glucose tolerance (oral): Secondary | ICD-10-CM

## 2016-08-06 LAB — CBC WITH DIFFERENTIAL/PLATELET
BASOS PCT: 0 %
Basophils Absolute: 0 cells/uL (ref 0–200)
EOS PCT: 4 %
Eosinophils Absolute: 276 cells/uL (ref 15–500)
HCT: 41.2 % (ref 35.0–45.0)
Hemoglobin: 13.9 g/dL (ref 11.7–15.5)
Lymphocytes Relative: 33 %
Lymphs Abs: 2277 cells/uL (ref 850–3900)
MCH: 29.5 pg (ref 27.0–33.0)
MCHC: 33.7 g/dL (ref 32.0–36.0)
MCV: 87.5 fL (ref 80.0–100.0)
MONOS PCT: 5 %
MPV: 12.1 fL (ref 7.5–12.5)
Monocytes Absolute: 345 cells/uL (ref 200–950)
NEUTROS ABS: 4002 {cells}/uL (ref 1500–7800)
Neutrophils Relative %: 58 %
PLATELETS: 300 10*3/uL (ref 140–400)
RBC: 4.71 MIL/uL (ref 3.80–5.10)
RDW: 13.6 % (ref 11.0–15.0)
WBC: 6.9 10*3/uL (ref 3.8–10.8)

## 2016-08-06 MED ORDER — HYDROCHLOROTHIAZIDE 12.5 MG PO TABS: 12.5000 mg | ORAL_TABLET | Freq: Every day | ORAL | 1 refills | Status: DC

## 2016-08-06 MED ORDER — ESCITALOPRAM OXALATE 20 MG PO TABS: 20.0000 mg | ORAL_TABLET | Freq: Every day | ORAL | 1 refills | Status: DC

## 2016-08-06 NOTE — Progress Notes (Signed)
Subjective:    Patient ID: Lauren Paul, female    DOB: [birth year], 50 y.o.   MRN: [id number]  08/06/2016  Follow-up (BLOOD PRESSURE)   HPI This 50 y.o. female presents for six month follow-up of hypertension, glucose intolerance, menopausal hot flashes, fatigue, OSA uncontrolled.  BP running 110/70.  Taking HCTZ 16m daily only.  Intermittent GI symptoms.    BP Readings from Last 3 Encounters:  08/06/16 110/70  01/30/16 102/76  07/21/15 134/90    Wt Readings from Last 3 Encounters:  08/06/16 167 lb (75.8 kg)  01/30/16 163 lb 3.2 oz (74 kg)  07/21/15 156 lb 9.6 oz (71 kg)   Walked one mile last night in the dark.   Insomnia: no insomnia recently.  Dental infection: s/p root canal; suffering with a lot of teeth infections.  In a lot of pain in May 2017; saw orthodontist who felt due to a lot of movement.  In 02/2016 had an abscess; had a lot of bone damage with abscess; s/p root canal.  So angry.  Now not doing any orthodontics currently; wanting graft to take; one month duration.  Checked root canal last week and still really loose.  Called infectious disease/[place] with [facility]; no concerns; just recommended follow-up.  Dr. HZenaida Niecewas concerned with the infection; had to graft.  Face was swollen mildly; no fever/chills/sweats.  Fatigued; still recovering from abscess. Did send records to Dr. TVanessa [place] spoke with dentist twice.  all the pain is gone.  No ear pain.  No headaches.  No flu vaccine.  [place] [employer]  Unusual bruising lately on forearms, legs.  No nosebleeds; no hematuria; no bloody stools; no excessive gums bleeding.     Review of Systems  Constitutional: Positive for fatigue. Negative for chills, diaphoresis and fever.  Eyes: Negative for visual disturbance.  Respiratory: Negative for cough and shortness of breath.   Cardiovascular: Negative for chest pain, palpitations and leg swelling.  Gastrointestinal: Negative for abdominal pain,  constipation, diarrhea, nausea and vomiting.  Endocrine: Negative for cold intolerance, heat intolerance, polydipsia, polyphagia and polyuria.  Neurological: Negative for dizziness, tremors, seizures, syncope, facial asymmetry, speech difficulty, weakness, light-headedness, numbness and headaches.  Hematological: Bruises/bleeds easily.    Past Medical History:  Diagnosis Date  . Allergy   . Anemia   . Asthma   . Headache    migraines  . Hypersomnolence 06/19/2014  . Hypertension   . Meningitis   . Rectal fissure   . Sepsis ([place]   . Ulcer ([facility]    Past Surgical History:  Procedure Laterality Date  . ABLATION    . DILATION AND CURETTAGE OF UTERUS    . TUBAL LIGATION     Allergies  Allergen Reactions  . Benadryl [Diphenhydramine Hcl] Shortness Of Breath    Pt states she can take Childrens Benadryl dye free fine.  . Latex Swelling  . Penicillins Anaphylaxis    Pt states she can not take anything in the "cillin" family.  .Marland KitchenApap-Pamabrom-Pyrilamine   . Iodides   . Losartan Other (See Comments)    LEG CRAMPS  . Other     Watermelon- Anaphylaxis Nuts Melon  . Shellfish Allergy   . Sulfa Antibiotics    Current Outpatient Prescriptions  Medication Sig Dispense Refill  . escitalopram (LEXAPRO) 20 MG tablet Take 1 tablet (20 mg total) by mouth daily. 90 tablet 1  . hydrochlorothiazide (HYDRODIURIL) 12.5 MG tablet Take 1 tablet (12.5 mg total) by mouth daily. 9[place]  tablet 1  . Multiple Vitamin (MULTIVITAMIN) capsule Take 1 capsule by mouth daily.    . traZODone (DESYREL) 50 MG tablet TAKE 1-2 TABLETS (50-100 MG TOTAL) BY MOUTH AT BEDTIME AS NEEDED FOR SLEEP 45 tablet 0  . irbesartan (AVAPRO) 75 MG tablet Take 1 tablet (75 mg total) by mouth daily. 30 tablet 5  . losartan (COZAAR) 50 MG tablet Take 1 tablet (50 mg total) by mouth daily. (Patient not taking: Reported on 08/06/2016) 30 tablet 1   Current Facility-Administered Medications  Medication Dose Route Frequency Provider  Last Rate Last Dose  . gi cocktail (Maalox,Lidocaine,Donnatal)  30 mL Oral Once Joretta Bachelor, PA       Social History   Social History  . Marital status: Single    Spouse name: N/A  . Number of children: 0  . Years of education: 12   Occupational History  . CLERICAL     [employer]   Social History Main Topics  . Smoking status: Never Smoker  . Smokeless tobacco: Never Used  . Alcohol use 1.2 oz/week    2 Standard drinks or equivalent per week     Comment: 2-3 glasses of wkly  . Drug use: No  . Sexual activity: Not on file   Other Topics Concern  . Not on file   Social History Narrative   Marital status:  Single; not dating.  Not interested.      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      Tobacco:  None      Alcohol:  Weekends; 3 glasses of wine weekly      Exercise:  Sporadic.       Caffeine: One cup of caffeine daily.   Family History  Problem Relation Age of Onset  . Bladder Cancer Mother 86  . Uterine cancer Mother   . Cancer Mother     Uterine and bladder cancer  . Hypertension Mother   . Multiple sclerosis Mother   . Lung cancer Father   . Cancer Father 33    lung cancer  . Hypertension Sister        Objective:    BP 110/70 (BP Location: Left Arm, Patient Position: Sitting, Cuff Size: Small)   Pulse 68   Temp 98.4 F (36.9 C) (Oral)   Resp 18   Ht 5' 0.5" (1.537 m)   Wt 167 lb (75.8 kg)   SpO2 99%   BMI 32.08 kg/m  Physical Exam  Constitutional: She is oriented to person, place, and time. She appears well-developed and well-nourished. No distress.  HENT:  Head: Normocephalic and atraumatic.  Right Ear: External ear normal.  Left Ear: External ear normal.  Nose: Nose normal.  Mouth/Throat: Oropharynx is clear and moist.  Eyes: Conjunctivae and EOM are normal. Pupils are equal, round, and reactive to light.  Neck: Normal range of  motion. Neck supple. Carotid bruit is not present. No thyromegaly present.  Cardiovascular: Normal rate, regular rhythm, normal heart sounds and intact distal pulses.  Exam reveals no gallop and no friction rub.   No murmur heard. Pulmonary/Chest: Effort normal and breath sounds normal. She has no wheezes. She has no rales.  Abdominal: Soft. Bowel sounds are normal. She exhibits no distension and no mass. There is no tenderness. There is no rebound and no guarding.  Lymphadenopathy:    She  has no cervical adenopathy.  Neurological: She is alert and oriented to person, place, and time. No cranial nerve deficit.  Skin: Skin is warm and dry. No rash noted. She is not diaphoretic. No erythema. No pallor.  Psychiatric: She has a normal mood and affect. Her behavior is normal.   Results for orders placed or performed in visit on 08/06/16  CBC with Differential/Platelet  Result Value Ref Range   WBC 6.9 3.8 - 10.8 K/uL   RBC 4.71 3.80 - 5.10 MIL/uL   Hemoglobin 13.9 11.7 - 15.5 g/dL   HCT 41.2 35.0 - 45.0 %   MCV 87.5 80.0 - 100.0 fL   MCH 29.5 27.0 - 33.0 pg   MCHC 33.7 32.0 - 36.0 g/dL   RDW 13.6 11.0 - 15.0 %   Platelets 300 140 - 400 K/uL   MPV 12.1 7.5 - 12.5 fL   Neutro Abs 4,002 1,500 - 7,800 cells/uL   Lymphs Abs 2,277 850 - 3,900 cells/uL   Monocytes Absolute 345 200 - 950 cells/uL   Eosinophils Absolute 276 15 - 500 cells/uL   Basophils Absolute 0 0 - 200 cells/uL   Neutrophils Relative % 58 %   Lymphocytes Relative 33 %   Monocytes Relative 5 %   Eosinophils Relative 4 %   Basophils Relative 0 %   Smear Review Criteria for review not met   Comprehensive metabolic panel  Result Value Ref Range   Sodium 138 135 - 146 mmol/L   Potassium 3.9 3.5 - 5.3 mmol/L   Chloride 100 98 - 110 mmol/L   CO2 26 20 - 31 mmol/L   Glucose, Bld 91 65 - 99 mg/dL   BUN 14 7 - 25 mg/dL   Creat 0.96 0.50 - 1.10 mg/dL   Total Bilirubin 0.5 0.2 - 1.2 mg/dL   Alkaline Phosphatase 74 33 - 115 U/L     AST 19 10 - 35 U/L   ALT 13 6 - 29 U/L   Total Protein 7.5 6.1 - 8.1 g/dL   Albumin 4.1 3.6 - 5.1 g/dL   Calcium 9.5 8.6 - 10.2 mg/dL  Hemoglobin A1c  Result Value Ref Range   Hgb A1c MFr Bld 5.6 <5.7 %   Mean Plasma Glucose 114 mg/dL       Assessment & Plan:   1. Essential hypertension, benign   2. Glucose intolerance (impaired glucose tolerance)   3. Menopausal hot flushes   4. OSA (obstructive sleep apnea)    -controlled with HCTZ only; continue current medication; obtain labs; refill provided. -hot flashes controlled with Lexapro; refill provided. -recommend weight loss, exercise, low-sugar food choices for Glucose intolerance.   Orders Placed This Encounter  Procedures  . CBC with Differential/Platelet  . Comprehensive metabolic panel  . Hemoglobin A1c   Meds ordered this encounter  Medications  . hydrochlorothiazide (HYDRODIURIL) 12.5 MG tablet    Sig: Take 1 tablet (12.5 mg total) by mouth daily.    Dispense:  90 tablet    Refill:  1  . escitalopram (LEXAPRO) 20 MG tablet    Sig: Take 1 tablet (20 mg total) by mouth daily.    Dispense:  90 tablet    Refill:  1    Return in about 6 months (around 02/04/2017) for complete physical examiniation.   Lauren Paul, M.D. [facility] [address] [place], [place]  [address] [phone] phone [phone] fax

## 2016-08-06 NOTE — Patient Instructions (Signed)
     IF you received an x-ray today, you will receive an invoice from [facility]. Please contact [facility] at [phone] with questions or concerns regarding your invoice.   IF you received labwork today, you will receive an invoice from [facility]. Please contact [facility] at [phone] with questions or concerns regarding your invoice.   Our billing staff will not be able to assist you with questions regarding bills from these companies.  You will be contacted with the lab results as soon as they are available. The fastest way to get your results is to activate your My Chart account. Instructions are located on the last page of this paperwork. If you have not heard from us regarding the results in 2 weeks, please contact this office.      

## 2016-08-07 LAB — COMPREHENSIVE METABOLIC PANEL
ALK PHOS: 74 U/L (ref 33–115)
ALT: 13 U/L (ref 6–29)
AST: 19 U/L (ref 10–35)
Albumin: 4.1 g/dL (ref 3.6–5.1)
BILIRUBIN TOTAL: 0.5 mg/dL (ref 0.2–1.2)
BUN: 14 mg/dL (ref 7–25)
CO2: 26 mmol/L (ref 20–31)
CREATININE: 0.96 mg/dL (ref 0.50–1.10)
Calcium: 9.5 mg/dL (ref 8.6–10.2)
Chloride: 100 mmol/L (ref 98–110)
Glucose, Bld: 91 mg/dL (ref 65–99)
Potassium: 3.9 mmol/L (ref 3.5–5.3)
SODIUM: 138 mmol/L (ref 135–146)
TOTAL PROTEIN: 7.5 g/dL (ref 6.1–8.1)

## 2016-08-07 LAB — HEMOGLOBIN A1C
HEMOGLOBIN A1C: 5.6 % (ref <5.7)
Mean Plasma Glucose: 114 mg/dL

## 2016-08-12 DIAGNOSIS — G4733 Obstructive sleep apnea (adult) (pediatric): Secondary | ICD-10-CM | POA: Insufficient documentation

## 2016-08-19 ENCOUNTER — Other Ambulatory Visit: Payer: Self-pay | Admitting: Family Medicine

## 2016-08-27 ENCOUNTER — Encounter: Payer: Self-pay | Admitting: Physician Assistant

## 2016-08-27 DIAGNOSIS — E669 Obesity, unspecified: Secondary | ICD-10-CM | POA: Insufficient documentation

## 2016-12-23 ENCOUNTER — Telehealth: Payer: Self-pay | Admitting: Family Medicine

## 2016-12-23 NOTE — Telephone Encounter (Signed)
Pt is needing to go back to the original blood pressure dosage-the decrease does not seem to be working for the patient   Best number [phone]

## 2016-12-23 NOTE — Telephone Encounter (Signed)
lmtcb with some readings/concerns

## 2016-12-24 MED ORDER — OLMESARTAN MEDOXOMIL 20 MG PO TABS: 10.0000 mg | ORAL_TABLET | Freq: Every day | ORAL | 1 refills | Status: DC

## 2016-12-24 NOTE — Telephone Encounter (Signed)
I sent in Olmesartan 20mg  1/2 daily.  Has appointment with me on 02-04-17.

## 2016-12-24 NOTE — Telephone Encounter (Signed)
Blood pressure reading is normal (she did not have any readings)  She believes  hctz is making her legs cramp and nose bleeds occasionally.    hctz dose 25mg  now 12.5mg  (stopped amlodipine x 9 months.)  She would like a different med with less side effects

## 2017-01-02 ENCOUNTER — Ambulatory Visit (INDEPENDENT_AMBULATORY_CARE_PROVIDER_SITE_OTHER): Payer: Managed Care, Other (non HMO)

## 2017-01-02 ENCOUNTER — Ambulatory Visit (INDEPENDENT_AMBULATORY_CARE_PROVIDER_SITE_OTHER): Payer: Managed Care, Other (non HMO) | Admitting: Family Medicine

## 2017-01-02 VITALS — BP 132/90 | HR 77 | Temp 98.6°F | Resp 18 | Ht 60.5 in | Wt 172.0 lb

## 2017-01-02 DIAGNOSIS — M5432 Sciatica, left side: Secondary | ICD-10-CM

## 2017-01-02 DIAGNOSIS — R7302 Impaired glucose tolerance (oral): Secondary | ICD-10-CM

## 2017-01-02 DIAGNOSIS — G4733 Obstructive sleep apnea (adult) (pediatric): Secondary | ICD-10-CM | POA: Diagnosis not present

## 2017-01-02 DIAGNOSIS — R252 Cramp and spasm: Secondary | ICD-10-CM | POA: Diagnosis not present

## 2017-01-02 DIAGNOSIS — Z6833 Body mass index (BMI) 33.0-33.9, adult: Secondary | ICD-10-CM | POA: Diagnosis not present

## 2017-01-02 DIAGNOSIS — E6609 Other obesity due to excess calories: Secondary | ICD-10-CM

## 2017-01-02 LAB — POCT URINALYSIS DIP (MANUAL ENTRY)
BILIRUBIN UA: NEGATIVE
BILIRUBIN UA: NEGATIVE
GLUCOSE UA: NEGATIVE
Leukocytes, UA: NEGATIVE
Nitrite, UA: NEGATIVE
Protein Ur, POC: NEGATIVE
RBC UA: NEGATIVE
SPEC GRAV UA: 1.02
Urobilinogen, UA: 0.2
pH, UA: 6

## 2017-01-02 MED ORDER — BUPROPION HCL ER (XL) 150 MG PO TB24: 150.0000 mg | ORAL_TABLET | Freq: Every day | ORAL | 5 refills | Status: DC

## 2017-01-02 MED ORDER — MELOXICAM 15 MG PO TABS: 15.0000 mg | ORAL_TABLET | Freq: Every day | ORAL | 0 refills | Status: DC

## 2017-01-02 NOTE — Progress Notes (Signed)
Subjective:    Patient ID: Lauren Paul, female    DOB: Jun 02, 1966, 51 y.o.   MRN: [id number]  01/02/2017  Leg cramps (Bilateral x 2 months)   HPI This 51 y.o. female presents for evaluation of leg cramps at night mainly.  Onset two months ago.  Progressed from there.  If bends neck down, radiation into back and into L leg.  Compensating, R leg is pulling from hamstring.  Thought sciatica; chiropractor; got an adjustment and did not help.  Really hurts L lower back.No neck pain but lower back hurts. Still having leg cramps; took Flexeril last night.  Also recommended TRazodone.  SLept last night.  Some muslce cramps last night but better.  If gets up, triggers leg cramps.+n/t/burning in leg down side and into ankle medially.  +weakness.  No b/b dysfunciton.  No saddle paresthesias.    Drinks good fluid intake.  Drinks 64 ounces per day.  Also drinks Mt. Dew one daily.  Has decreased to 1 from 5 per day.  Regular Dews.  Not exercising.  Too tired to exercise.  No energy.  First thought upon awakening is wanting to get in bed.  Tired all the time.  Will have a cup of ceffee. Never refreshed.  Bedtime at 6:00 or 7:00pm; falls asleep within one hour.  Wakes up at 6:00am; sleeping 11 hours per night.  Tired all the time. No napping.  Gets up at night x 3 times to urinate; goes right back to bed.    Switched Olmesartan; checking BP at home; last week 120/80.    OSA: s/p sleep study; cannot tolerate mask.  Tried 3 different masks and gave up.  Has followed up with neurology; cannot tolerate anything; got frustrated.  Oral device?  Refuses ENT surgery for OSA.   Immunization History  Administered Date(s) Administered  . Tdap 05/11/2014   BP Readings from Last 3 Encounters:  02/04/17 (!) 150/102  01/02/17 132/90  08/06/16 110/70   Wt Readings from Last 3 Encounters:  02/04/17 174 lb 9.6 oz (79.2 kg)  01/02/17 172 lb (78 kg)  08/06/16 167 lb (75.8 kg)    Review of Systems  Constitutional:  Positive for fatigue. Negative for chills, diaphoresis and fever.  Eyes: Negative for visual disturbance.  Respiratory: Negative for cough and shortness of breath.   Cardiovascular: Negative for chest pain, palpitations and leg swelling.  Gastrointestinal: Negative for abdominal pain, constipation, diarrhea, nausea and vomiting.  Endocrine: Negative for cold intolerance, heat intolerance, polydipsia, polyphagia and polyuria.  Musculoskeletal: Positive for back pain and myalgias.  Neurological: Negative for dizziness, tremors, seizures, syncope, facial asymmetry, speech difficulty, weakness, light-headedness, numbness and headaches.  Psychiatric/Behavioral: Negative for dysphoric mood and sleep disturbance. The patient is not nervous/anxious.     Past Medical History:  Diagnosis Date  . Allergy   . Anemia   . Arthritis   . Asthma   . Headache    migraines  . Hypersomnolence 06/19/2014  . Hypertension   . Meningitis   . Rectal fissure   . Sepsis ([place])   . Ulcer    Past Surgical History:  Procedure Laterality Date  . ABLATION    . DILATION AND CURETTAGE OF UTERUS    . TUBAL LIGATION     Allergies  Allergen Reactions  . Benadryl [Diphenhydramine Hcl] Shortness Of Breath    Pt states she can take Childrens Benadryl dye free fine.  . Latex Swelling  . Penicillins Anaphylaxis    Pt states  she can not take anything in the "cillin" family.  Marland Kitchen Apap-Pamabrom-Pyrilamine   . Iodides   . Losartan Other (See Comments)    LEG CRAMPS  . Other     Watermelon- Anaphylaxis Nuts Melon  . Shellfish Allergy   . Sulfa Antibiotics     Social History   Social History  . Marital status: Single    Spouse name: N/A  . Number of children: 0  . Years of education: 12   Occupational History  . CLERICAL     [employer]   Social History Main Topics  . Smoking status: Never Smoker  . Smokeless tobacco: Never Used  . Alcohol use 1.2 oz/week    2 Standard drinks or equivalent per week      Comment: 2-3 glasses of wkly  . Drug use: No  . Sexual activity: Yes    Birth control/ protection: Surgical   Other Topics Concern  . Not on file   Social History Narrative   Marital status:  Single; not dating.  Not interested in 2018.      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      Tobacco:  None      Alcohol:  Weekends; 5 glasses of wine weekly      Exercise:  Sporadic.       Caffeine: One cup of caffeine daily.         Family History  Problem Relation Age of Onset  . Bladder Cancer Mother 51  . Uterine cancer Mother   . Cancer Mother 33    Uterine and bladder cancer  . Hypertension Mother   . Multiple sclerosis Mother   . Lung cancer Father   . Cancer Father 71    lung cancer  . Hypertension Sister        Objective:    BP 132/90   Pulse 77   Temp 98.6 F (37 C) (Oral)   Resp 18   Ht 5' 0.5" (1.537 m)   Wt 172 lb (78 kg)   SpO2 97%   BMI 33.04 kg/m  Physical Exam  Constitutional: She is oriented to person, place, and time. She appears well-developed and well-nourished. No distress.  HENT:  Head: Normocephalic and atraumatic.  Right Ear: External ear normal.  Left Ear: External ear normal.  Nose: Nose normal.  Mouth/Throat: Oropharynx is clear and moist.  Eyes: Conjunctivae and EOM are normal. Pupils are equal, round, and reactive to light.  Neck: Normal range of motion. Neck supple. Carotid bruit is not present. No thyromegaly present.  Cardiovascular: Normal rate, regular rhythm, normal heart sounds and intact distal pulses.  Exam reveals no gallop and no friction rub.   No murmur heard. Pulmonary/Chest: Effort normal and breath sounds normal. She has no wheezes. She has no rales.  Abdominal: Soft. Bowel sounds are normal. She exhibits no distension and no mass. There is no tenderness. There is no rebound and no guarding.    Musculoskeletal:       Lumbar back: She exhibits pain. She exhibits normal range of motion, no tenderness, no bony tenderness, no spasm and normal pulse.  Lumbar spine:  Non-tender midline; non-tender paraspinal regions B.  Straight leg raises negative B; toe and heel walking intact; marching intact; motor 5/5 BLE.  Full ROM lumbar spine without  limitation.   Lymphadenopathy:    She has no cervical adenopathy.  Neurological: She is alert and oriented to person, place, and time. No cranial nerve deficit.  Skin: Skin is warm and dry. No rash noted. She is not diaphoretic. No erythema. No pallor.  Psychiatric: She has a normal mood and affect. Her behavior is normal.   Results for orders placed or performed in visit on 01/02/17  POCT urinalysis dipstick  Result Value Ref Range   Color, UA yellow yellow   Clarity, UA clear clear   Glucose, UA negative negative   Bilirubin, UA negative negative   Ketones, POC UA negative negative   Spec Grav, UA 1.020 1.003, 1.005, 1.010, 1.015, 1.020, 1.025, 1.030, 1.035   Blood, UA negative negative   pH, UA 6.0 5.0, 5.5, 6.0, 6.5, 7.0, 7.5, 8.0   Protein Ur, POC negative negative   Urobilinogen, UA 0.2 0.2, 1.0, negative   Nitrite, UA Negative Negative   Leukocytes, UA Negative Negative   No results found.     Assessment & Plan:   1. Left sided sciatica   2. Leg cramps   3. OSA (obstructive sleep apnea)   4. Glucose intolerance (impaired glucose tolerance)   5. Class 1 obesity due to excess calories with serious comorbidity and body mass index (BMI) of 33.0 to 33.9 in adult    -new onset L sciatica; rx for Flexeril and Meloxicam provided.  Recommend rest, home exercise program.  If no improvement in two weeks, call for ortho referral. -suffers with uncontrolled OSA; intolerant to all CPAP equipment; consider follow-up with sleep medication to discuss other treatment options. -rx for Wellbutrin provided to help with mood and fatigue. -for leg  cramps, encourage increased water intake and daily stretching; also recommend MVI daily. -highly recommend weight loss, exercise, and 1200 kcal diet for weight loss.   Orders Placed This Encounter  Procedures  . DG Lumbar Spine Complete    Standing Status:   Future    Number of Occurrences:   1    Standing Expiration Date:   01/02/2018    Order Specific Question:   Reason for Exam (SYMPTOM  OR DIAGNOSIS REQUIRED)    Answer:   low back pain with sciatica into L    Order Specific Question:   Is the patient pregnant?    Answer:   No    Order Specific Question:   Preferred imaging location?    Answer:   External  . POCT urinalysis dipstick   Meds ordered this encounter  Medications  . cyclobenzaprine (FLEXERIL) 10 MG tablet    Sig: Take 10 mg by mouth 3 (three) times daily as needed for muscle spasms.  . meloxicam (MOBIC) 15 MG tablet    Sig: Take 1 tablet (15 mg total) by mouth daily.    Dispense:  30 tablet    Refill:  0  . buPROPion (WELLBUTRIN XL) 150 MG 24 hr tablet    Sig: Take 1 tablet (150 mg total) by mouth daily.    Dispense:  30 tablet    Refill:  5    No Follow-up on file.   Stephannie Broner Elayne Guerin, M.D. Primary [facility] at [facility] [address] [place], [place]  [address] [phone] phone [phone] fax

## 2017-01-02 NOTE — Patient Instructions (Addendum)
   IF you received an x-ray today, you will receive an invoice from [facility]. Please contact [facility] at [phone] with questions or concerns regarding your invoice.   IF you received labwork today, you will receive an invoice from [facility]. Please contact [facility] at [phone] with questions or concerns regarding your invoice.   Our billing staff will not be able to assist you with questions regarding bills from these companies.  You will be contacted with the lab results as soon as they are available. The fastest way to get your results is to activate your My Chart account. Instructions are located on the last page of this paperwork. If you have not heard from us regarding the results in 2 weeks, please contact this office.    Spondylolisthesis Rehab Ask your health care provider which exercises are safe for you. Do exercises exactly as told by your health care provider and adjust them as directed. It is normal to feel mild stretching, pulling, tightness, or discomfort as you do these exercises, but you should stop right away if you feel sudden pain or your pain gets worse. Do not begin these exercises until told by your health care provider. Stretching and range of motion exercises These exercises warm up your muscles and joints and improve the movement and flexibility of your hips and your back. These exercises may also help to relieve pain, numbness, and tingling. Exercise A: Single knee to chest   1. Lie on your back on a firm surface with both legs straight. 2. Bend one of your knees. Use your hands to move your knee up toward your chest until you feel a gentle stretch in your lower back and buttock.  Hold your leg in this position by holding onto the front of your knee.  Keep your other leg as straight as possible. 3. Hold for __________ seconds. 4. Slowly return to the starting position. 5. Repeat this exercise with your other leg. Repeat __________  times. Complete this exercise __________ times a day. Exercise B: Double knee to chest   1. Lie on your back on a firm surface with both legs straight. 2. Bend one of your knees and move it toward your chest until you feel a gentle stretch in your lower back and buttock. 3. Tense your abdominal muscles and repeat the previous step with your other leg. 4. Hold both of your legs in this position by holding onto the backs of your thighs or the fronts of your knees. 5. Hold for __________ seconds. 6. Tense your abdominal muscles and slowly move your legs back to the floor, one leg at a time. Repeat __________ times. Complete this exercise __________ times a day. Strengthening exercises These exercises build strength and endurance in your back. Endurance is the ability to use your muscles for a long time, even after they get tired. Exercise C: Pelvic tilt  1. Lie on your back on a firm bed or the floor. Bend your knees and keep your feet flat. 2. Tense your abdominal muscles. Tip your pelvis up toward the ceiling and flatten your lower back into the floor.  To help with this exercise, you may place a small towel under your lower back and try to push your back into the towel. 3. Hold for __________ seconds. 4. Let your muscles relax completely before you repeat this exercise. Repeat __________ times. Complete this exercise __________ times a day. Exercise D: Abdominal crunch   1. Lie on your back on   a firm surface. Bend your knees and keep your feet flat. Cross your arms over your chest. 2. Tuck your chin down toward your chest, without bending your neck. 3. Use your abdominal muscles to lift your upper body off of the ground, straight up into the air.  Try to lift yourself until your shoulder blades are off the ground. You may need to work up to this.  Keep your lower back on the ground while you crunch upward.  Do not hold your breath. 4. Slowly lower yourself down. Keep your abdominal  muscles tense until you are back to the starting position. Repeat __________ times. Complete this exercise __________ times a day. Exercise E: Alternating arm and leg raises   1. Get on your hands and knees on a firm surface. If you are on a hard floor, you may want to use padding to cushion your knees, such as an exercise mat. 2. Line up your arms and legs. Your hands should be below your shoulders, and your knees should be below your hips. 3. Lift your left leg behind you. At the same time, raise your right arm and straighten it in front of you.  Do not lift your leg higher than your hip.  Do not lift your arm higher than your shoulder.  Keep your abdominal and back muscles tight.  Keep your hips facing the ground.  Do not arch your back.  Keep your balance carefully, and do not hold your breath. 4. Hold for __________ seconds. 5. Slowly return to the starting position and repeat with your right leg and your left arm. Repeat __________ times. Complete this exercise __________ times a day. Posture and body mechanics   Body mechanics refers to the movements and positions of your body while you do your daily activities. Posture is part of body mechanics. Good posture and healthy body mechanics can help to relieve stress in your body's tissues and joints. Good posture means that your spine is in its natural S-curve position (your spine is neutral), your shoulders are pulled back slightly, and your head is not tipped forward. The following are general guidelines for applying improved posture and body mechanics to your everyday activities. Standing    When standing, keep your spine neutral and your feet about hip-width apart. Keep a slight bend in your knees. Your ears, shoulders, and hips should line up.  When you do a task in which you stand in one place for a long time, place one foot up on a stable object that is 2-4 inches (5-10 cm) high, such as a footstool. This helps keep your spine  neutral. Sitting    When sitting, keep your spine neutral and keep your feet flat on the floor. Use a footrest, if necessary, and keep your thighs parallel to the floor. Avoid rounding your shoulders, and avoid tilting your head forward.  When working at a desk or a computer, keep your desk at a height where your hands are slightly lower than your elbows. Slide your chair under your desk so you are close enough to maintain good posture.  When working at a computer, place your monitor at a height where you are looking straight ahead and you do not have to tilt your head forward or downward to look at the screen. Resting   When lying down and resting, avoid positions that are most painful for you.  If you have pain with activities such as sitting, bending, stooping, or squatting (flexion-based activities), lie in   a position in which your body does not bend very much. For example, avoid curling up on your side with your arms and knees near your chest (fetal position).  If you have pain with activities such as standing for a long time or reaching with your arms (extension-based activities), lie with your spine in a neutral position and bend your knees slightly. Try the following positions:  Lying on your side with a pillow between your knees.  Lying on your back with a pillow under your knees. Lifting    When lifting objects, keep your feet at least shoulder-width apart and tighten your abdominal muscles.  Bend your knees and hips and keep your spine neutral. It is important to lift using the strength of your legs, not your back. Do not lock your knees straight out.  Always ask for help to lift heavy or awkward objects. This information is not intended to replace advice given to you by your health care provider. Make sure you discuss any questions you have with your health care provider. Document Released: 10/07/2005 Document Revised: 06/13/2016 Document Reviewed: 07/18/2015 Elsevier  Interactive Patient Education  2017 [employer]vier [employer].  

## 2017-01-08 ENCOUNTER — Encounter: Payer: Self-pay | Admitting: Family Medicine

## 2017-01-29 ENCOUNTER — Other Ambulatory Visit: Payer: Self-pay | Admitting: Family Medicine

## 2017-01-30 ENCOUNTER — Telehealth: Payer: Self-pay | Admitting: Family Medicine

## 2017-01-30 DIAGNOSIS — M4317 Spondylolisthesis, lumbosacral region: Secondary | ICD-10-CM

## 2017-01-30 DIAGNOSIS — M5442 Lumbago with sciatica, left side: Secondary | ICD-10-CM

## 2017-01-30 NOTE — Telephone Encounter (Signed)
Referral not mentioned in OV. Please place if appropriate, thanks!

## 2017-01-30 NOTE — Telephone Encounter (Signed)
Pt calling about referral placed for her back. Pt was last seen on 3/15 and said a referral was going to be placed but she has not heard back. I did not see a referral for this pt. Please advise. Thanks!

## 2017-01-31 ENCOUNTER — Encounter: Payer: Self-pay | Admitting: Family Medicine

## 2017-02-03 NOTE — Telephone Encounter (Signed)
Referral not placed at visit on 01/02/17 as patient expressed that she desired to take medication and do home exercise program first.  Then, if medications and home exercise program did not work over the following 2-4 weeks, patient was to contact office to have referral placed.  I have placed referral now.

## 2017-02-04 ENCOUNTER — Encounter: Payer: Self-pay | Admitting: Family Medicine

## 2017-02-04 ENCOUNTER — Ambulatory Visit (INDEPENDENT_AMBULATORY_CARE_PROVIDER_SITE_OTHER): Payer: Managed Care, Other (non HMO) | Admitting: Family Medicine

## 2017-02-04 VITALS — BP 150/102 | HR 69 | Temp 98.4°F | Resp 18 | Ht 60.5 in | Wt 174.6 lb

## 2017-02-04 DIAGNOSIS — G4733 Obstructive sleep apnea (adult) (pediatric): Secondary | ICD-10-CM

## 2017-02-04 DIAGNOSIS — I10 Essential (primary) hypertension: Secondary | ICD-10-CM | POA: Diagnosis not present

## 2017-02-04 DIAGNOSIS — Z0000 Encounter for general adult medical examination without abnormal findings: Secondary | ICD-10-CM | POA: Diagnosis not present

## 2017-02-04 DIAGNOSIS — N951 Menopausal and female climacteric states: Secondary | ICD-10-CM

## 2017-02-04 DIAGNOSIS — Z6833 Body mass index (BMI) 33.0-33.9, adult: Secondary | ICD-10-CM

## 2017-02-04 DIAGNOSIS — R7302 Impaired glucose tolerance (oral): Secondary | ICD-10-CM

## 2017-02-04 DIAGNOSIS — E6609 Other obesity due to excess calories: Secondary | ICD-10-CM

## 2017-02-04 DIAGNOSIS — Z13220 Encounter for screening for lipoid disorders: Secondary | ICD-10-CM | POA: Diagnosis not present

## 2017-02-04 DIAGNOSIS — M5432 Sciatica, left side: Secondary | ICD-10-CM | POA: Diagnosis not present

## 2017-02-04 LAB — POCT URINALYSIS DIP (MANUAL ENTRY)
BILIRUBIN UA: NEGATIVE
BILIRUBIN UA: NEGATIVE mg/dL
Blood, UA: NEGATIVE
GLUCOSE UA: NEGATIVE mg/dL
Leukocytes, UA: NEGATIVE
Nitrite, UA: NEGATIVE
Protein Ur, POC: 30 mg/dL — AB
SPEC GRAV UA: 1.015 (ref 1.010–1.025)
Urobilinogen, UA: 0.2 E.U./dL
pH, UA: 8.5 — AB (ref 5.0–8.0)

## 2017-02-04 MED ORDER — HYDROCHLOROTHIAZIDE 12.5 MG PO TABS: 12.5000 mg | ORAL_TABLET | Freq: Every day | ORAL | 1 refills | Status: DC

## 2017-02-04 MED ORDER — ESCITALOPRAM OXALATE 20 MG PO TABS: 20.0000 mg | ORAL_TABLET | Freq: Every day | ORAL | 1 refills | Status: DC

## 2017-02-04 MED ORDER — PREDNISONE 20 MG PO TABS: ORAL_TABLET | ORAL | 0 refills | Status: DC

## 2017-02-04 MED ORDER — TRAZODONE HCL 50 MG PO TABS: ORAL_TABLET | ORAL | 0 refills | Status: DC

## 2017-02-04 MED ORDER — OXYCODONE HCL 5 MG PO TABS: 5.0000 mg | ORAL_TABLET | ORAL | 0 refills | Status: DC | PRN

## 2017-02-04 NOTE — Patient Instructions (Addendum)
IF you received an x-ray today, you will receive an invoice from [facility]. Please contact [facility] at [phone] with questions or concerns regarding your invoice.   IF you received labwork today, you will receive an invoice from [facility]. Please contact [facility] at [phone] with questions or concerns regarding your invoice.   Our billing staff will not be able to assist you with questions regarding bills from these companies.  You will be contacted with the lab results as soon as they are available. The fastest way to get your results is to activate your My Chart account. Instructions are located on the last page of this paperwork. If you have not heard from [place] regarding the results in 2 weeks, please contact this office.      Preventive Care 40-64 Years, Female Preventive care refers to lifestyle choices and visits with your health care provider that can promote health and wellness. What does preventive care include?  A yearly physical exam. This is also called an annual well check.  Dental exams once or twice a year.  Routine eye exams. Ask your health care provider how often you should have your eyes checked.  Personal lifestyle choices, including:  Daily care of your teeth and gums.  Regular physical activity.  Eating a healthy diet.  Avoiding tobacco and drug use.  Limiting alcohol use.  Practicing safe sex.  Taking low-dose aspirin daily starting at age 55.  Taking vitamin and mineral supplements as recommended by your health care provider. What happens during an annual well check? The services and screenings done by your health care provider during your annual well check will depend on your age, overall health, lifestyle risk factors, and family history of disease. Counseling  Your health care provider may ask you questions about your:  Alcohol use.  Tobacco use.  Drug use.  Emotional well-being.  Home and relationship  well-being.  Sexual activity.  Eating habits.  Work and work Statistician.  Method of birth control.  Menstrual cycle.  Pregnancy history. Screening  You may have the following tests or measurements:  Height, weight, and BMI.  Blood pressure.  Lipid and cholesterol levels. These may be checked every 5 years, or more frequently if you are over 13 years old.  Skin check.  Lung cancer screening. You may have this screening every year starting at age 20 if you have a 30-pack-year history of smoking and currently smoke or have quit within the past 15 years.  Fecal occult blood test (FOBT) of the stool. You may have this test every year starting at age 41.  Flexible sigmoidoscopy or colonoscopy. You may have a sigmoidoscopy every 5 years or a colonoscopy every 10 years starting at age 79.  Hepatitis C blood test.  Hepatitis B blood test.  Sexually transmitted disease (STD) testing.  Diabetes screening. This is done by checking your blood sugar (glucose) after you have not eaten for a while (fasting). You may have this done every 1-3 years.  Mammogram. This may be done every 1-2 years. Talk to your health care provider about when you should start having regular mammograms. This may depend on whether you have a family history of breast cancer.  BRCA-related cancer screening. This may be done if you have a family history of breast, ovarian, tubal, or peritoneal cancers.  Pelvic exam and Pap test. This may be done every 3 years starting at age 24. Starting at age 33, this may be done every 5 years  if you have a Pap test in combination with an HPV test.  Bone density scan. This is done to screen for osteoporosis. You may have this scan if you are at high risk for osteoporosis. Discuss your test results, treatment options, and if necessary, the need for more tests with your health care provider. Vaccines  Your health care provider may recommend certain vaccines, such  as:  Influenza vaccine. This is recommended every year.  Tetanus, diphtheria, and acellular pertussis (Tdap, Td) vaccine. You may need a Td booster every 10 years.  Varicella vaccine. You may need this if you have not been vaccinated.  Zoster vaccine. You may need this after age 10.  Measles, mumps, and rubella (MMR) vaccine. You may need at least one dose of MMR if you were born in 1957 or later. You may also need a second dose.  Pneumococcal 13-valent conjugate (PCV13) vaccine. You may need this if you have certain conditions and were not previously vaccinated.  Pneumococcal polysaccharide (PPSV23) vaccine. You may need one or two doses if you smoke cigarettes or if you have certain conditions.  Meningococcal vaccine. You may need this if you have certain conditions.  Hepatitis A vaccine. You may need this if you have certain conditions or if you travel or work in places where you may be exposed to hepatitis A.  Hepatitis B vaccine. You may need this if you have certain conditions or if you travel or work in places where you may be exposed to hepatitis B.  Haemophilus influenzae type b (Hib) vaccine. You may need this if you have certain conditions. Talk to your health care provider about which screenings and vaccines you need and how often you need them. This information is not intended to replace advice given to you by your health care provider. Make sure you discuss any questions you have with your health care provider. Document Released: 11/03/2015 Document Revised: 06/26/2016 Document Reviewed: 08/08/2015 Elsevier Interactive Patient Education  2017 [employer].

## 2017-02-04 NOTE — Progress Notes (Signed)
Subjective:    Patient ID: Lauren Paul, female    DOB: 1966/09/18, 51 y.o.   MRN: [id number]  02/04/2017  Annual Exam (6 month CPE)   HPI This 51 y.o. female presents for Complete Physical Examination.  Last physical:  01-30-2016 Pap smear:  2017 [facility]. WNL; needs to schedule for 2018 Mammogram: 2014; plans to schedule. Eye exam:  2018; new glasses Dental exam:  Constant; braces.  Immunization History  Administered Date(s) Administered  . Tdap 05/11/2014   BP Readings from Last 3 Encounters:  02/04/17 (!) 150/102  01/02/17 132/90  08/06/16 110/70   Wt Readings from Last 3 Encounters:  02/04/17 174 lb 9.6 oz (79.2 kg)  01/02/17 172 lb (78 kg)  08/06/16 167 lb (75.8 kg)    Alive vitamin for energy with B12 sublingual; now has a lot of energy. Has been taking for the past two weeks.  HTN: having nausea with Olmesartan; wants to switch back to HCTZ.  L sciatica:  Pain has worsened since last visit.  Pain is now constant; pain radiates into L foot; 6/10.  Taking Meloxicam daily; ran out of Flexeril.  Took Aleve last night.    Review of Systems  Constitutional: Negative for activity change, appetite change, chills, diaphoresis, fatigue, fever and unexpected weight change.  HENT: Negative for congestion, dental problem, drooling, ear discharge, ear pain, facial swelling, hearing loss, mouth sores, nosebleeds, postnasal drip, rhinorrhea, sinus pressure, sneezing, sore throat, tinnitus, trouble swallowing and voice change.   Eyes: Negative for photophobia, pain, discharge, redness, itching and visual disturbance.  Respiratory: Negative for apnea, cough, choking, chest tightness, shortness of breath, wheezing and stridor.   Cardiovascular: Negative for chest pain, palpitations and leg swelling.  Gastrointestinal: Negative for abdominal distention, abdominal pain, anal bleeding, blood in stool, constipation, diarrhea, nausea, rectal pain and vomiting.    Endocrine: Negative for cold intolerance, heat intolerance, polydipsia, polyphagia and polyuria.  Genitourinary: Negative for decreased urine volume, difficulty urinating, dyspareunia, dysuria, enuresis, flank pain, frequency, genital sores, hematuria, menstrual problem, pelvic pain, urgency, vaginal bleeding, vaginal discharge and vaginal pain.  Musculoskeletal: Positive for back pain and gait problem. Negative for arthralgias, joint swelling, myalgias, neck pain and neck stiffness.  Skin: Negative for color change, pallor, rash and wound.  Allergic/Immunologic: Negative for environmental allergies, food allergies and immunocompromised state.  Neurological: Positive for numbness. Negative for dizziness, tremors, seizures, syncope, facial asymmetry, speech difficulty, weakness, light-headedness and headaches.  Hematological: Negative for adenopathy. Does not bruise/bleed easily.  Psychiatric/Behavioral: Negative for agitation, behavioral problems, confusion, decreased concentration, dysphoric mood, hallucinations, self-injury, sleep disturbance and suicidal ideas. The patient is not nervous/anxious and is not hyperactive.        Bedtime 7:00pm; wakes up 5:30am.    Past Medical History:  Diagnosis Date  . Allergy   . Anemia   . Arthritis   . Asthma   . Headache    migraines  . Hypersomnolence 06/19/2014  . Hypertension   . Meningitis   . Rectal fissure   . Sepsis ([place])   . Ulcer    Past Surgical History:  Procedure Laterality Date  . ABLATION    . DILATION AND CURETTAGE OF UTERUS    . TUBAL LIGATION     Allergies  Allergen Reactions  . Benadryl [Diphenhydramine Hcl] Shortness Of Breath    Pt states she can take Childrens Benadryl dye free fine.  . Latex Swelling  . Penicillins Anaphylaxis    Pt states she can  not take anything in the "cillin" family.  Marland Kitchen Apap-Pamabrom-Pyrilamine   . Iodides   . Losartan Other (See Comments)    LEG CRAMPS  . Olmesartan     Leg cramp   .  Other     Watermelon- Anaphylaxis Nuts Melon  . Shellfish Allergy   . Sulfa Antibiotics     Social History   Social History  . Marital status: Single    Spouse name: N/A  . Number of children: 0  . Years of education: 12   Occupational History  . CLERICAL     [employer]   Social History Main Topics  . Smoking status: Never Smoker  . Smokeless tobacco: Never Used  . Alcohol use 1.2 oz/week    2 Standard drinks or equivalent per week     Comment: 2-3 glasses of wkly  . Drug use: No  . Sexual activity: Yes    Birth control/ protection: Surgical   Other Topics Concern  . Not on file   Social History Narrative   Marital status:  Single; not dating.  Not interested in 2018.      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      Tobacco:  None      Alcohol:  Weekends; 5 glasses of wine weekly      Exercise:  Sporadic.       Caffeine: One cup of caffeine daily.         Family History  Problem Relation Age of Onset  . Bladder Cancer Mother 80  . Uterine cancer Mother   . Cancer Mother 54       Uterine and bladder cancer  . Hypertension Mother   . Multiple sclerosis Mother   . Lung cancer Father   . Cancer Father 63       lung cancer  . Hypertension Sister        Objective:    BP (!) 150/102   Pulse 69   Temp 98.4 F (36.9 C) (Oral)   Resp 18   Ht 5' 0.5" (1.537 m)   Wt 174 lb 9.6 oz (79.2 kg)   SpO2 98%   BMI 33.54 kg/m  Physical Exam  Constitutional: She is oriented to person, place, and time. She appears well-developed and well-nourished. No distress.  HENT:  Head: Normocephalic and atraumatic.  Right Ear: External ear normal.  Left Ear: External ear normal.  Nose: Nose normal.  Mouth/Throat: Oropharynx is clear and moist.  Eyes: Conjunctivae and EOM are normal. Pupils are equal, round, and reactive to light.  Neck: Normal  range of motion and full passive range of motion without pain. Neck supple. No JVD present. Carotid bruit is not present. No thyromegaly present.  Cardiovascular: Normal rate, regular rhythm and normal heart sounds.  Exam reveals no gallop and no friction rub.   No murmur heard. Pulmonary/Chest: Effort normal and breath sounds normal. She has no wheezes. She has no rales.  Abdominal: Soft. Bowel sounds are normal. She exhibits no distension and no mass. There is no tenderness. There is no rebound and no guarding.  Musculoskeletal:       Right shoulder: Normal.       Left shoulder: Normal.       Cervical back: Normal.       Lumbar back:  She exhibits decreased range of motion, pain and spasm. She exhibits no tenderness, no bony tenderness and normal pulse.  Lymphadenopathy:    She has no cervical adenopathy.  Neurological: She is alert and oriented to person, place, and time. She has normal reflexes. No cranial nerve deficit. She exhibits normal muscle tone. Coordination normal.  Skin: Skin is warm and dry. No rash noted. She is not diaphoretic. No erythema. No pallor.  Psychiatric: She has a normal mood and affect. Her behavior is normal. Judgment and thought content normal.  Nursing note and vitals reviewed.  Depression screen [facility] 2/9 02/04/2017 01/02/2017 08/06/2016 01/30/2016 07/21/2015  Decreased Interest 0 0 0 0 0  Down, Depressed, Hopeless 0 0 0 0 0  PHQ - 2 Score 0 0 0 0 0   Fall Risk  02/04/2017 01/02/2017 08/06/2016 01/30/2016 07/21/2015  Falls in the past year? No No No No No        Assessment & Plan:   1. Routine physical examination   2. Essential hypertension, benign   3. OSA (obstructive sleep apnea)   4. Glucose intolerance (impaired glucose tolerance)   5. Menopausal hot flushes   6. Class 1 obesity due to excess calories with serious comorbidity and body mass index (BMI) of 33.0 to 33.9 in adult   7. Screening, lipid   8. Left sided sciatica    -anticipatory guidance  --- exercise, weight loss, three servings of dairy daily, safe driving practices, safe sexual practices. -obtain age appropriate screening labs and labs for chronic disease management. -refills for chronic diseases provided. -rx for HCTZ provided to replace ARB.  Intolerant to current ARB. -L sciatica not improved from last visit; referred to orthopedics placed yesterday; rx for Prednisone and Percocet provided due to severity of pain.    Orders Placed This Encounter  Procedures  . CBC with Differential/Platelet  . Comprehensive metabolic panel    Order Specific Question:   Has the patient fasted?    Answer:   Yes  . Hemoglobin A1c  . Lipid panel    Order Specific Question:   Has the patient fasted?    Answer:   Yes  . TSH  . POCT urinalysis dipstick   Meds ordered this encounter  Medications  . escitalopram (LEXAPRO) 20 MG tablet    Sig: Take 1 tablet (20 mg total) by mouth daily.    Dispense:  90 tablet    Refill:  1  . DISCONTD: hydrochlorothiazide (HYDRODIURIL) 12.5 MG tablet    Sig: Take 1 tablet (12.5 mg total) by mouth daily.    Dispense:  90 tablet    Refill:  1  . traZODone (DESYREL) 50 MG tablet    Sig: TAKE 1-2 TABLETS (50-100 MG TOTAL) BY MOUTH AT BEDTIME AS NEEDED FOR SLEEP    Dispense:  90 tablet    Refill:  0  . predniSONE (DELTASONE) 20 MG tablet    Sig: Three tablets daily x 2 days then two tablets daily x 5 days then one tablet daily x 5 days    Dispense:  21 tablet    Refill:  0  . oxyCODONE (OXY IR/ROXICODONE) 5 MG immediate release tablet    Sig: Take 1 tablet (5 mg total) by mouth every 4 (four) hours as needed for severe pain.    Dispense:  30 tablet    Refill:  0    Return in about 6 months (around 08/06/2017) for recheck high blood pressure.   Renald Haithcock Elayne Guerin,  M.D. Primary Care at [facility] previously [facility] [place] [address] [place], [place]  [address] [phone] phone [phone] fax

## 2017-02-05 LAB — CBC WITH DIFFERENTIAL/PLATELET
BASOS ABS: 0 10*3/uL (ref 0.0–0.2)
Basos: 0 %
EOS (ABSOLUTE): 0.2 10*3/uL (ref 0.0–0.4)
Eos: 4 %
HEMOGLOBIN: 13.2 g/dL (ref 11.1–15.9)
Hematocrit: 39.4 % (ref 34.0–46.6)
IMMATURE GRANS (ABS): 0 10*3/uL (ref 0.0–0.1)
Immature Granulocytes: 0 %
LYMPHS: 38 %
Lymphocytes Absolute: 2.2 10*3/uL (ref 0.7–3.1)
MCH: 29 pg (ref 26.6–33.0)
MCHC: 33.5 g/dL (ref 31.5–35.7)
MCV: 87 fL (ref 79–97)
MONOCYTES: 5 %
Monocytes Absolute: 0.3 10*3/uL (ref 0.1–0.9)
NEUTROS PCT: 53 %
Neutrophils Absolute: 3.1 10*3/uL (ref 1.4–7.0)
Platelets: 299 10*3/uL (ref 150–379)
RBC: 4.55 x10E6/uL (ref 3.77–5.28)
RDW: 13.8 % (ref 12.3–15.4)
WBC: 5.8 10*3/uL (ref 3.4–10.8)

## 2017-02-05 LAB — LIPID PANEL
CHOL/HDL RATIO: 3.6 ratio (ref 0.0–4.4)
CHOLESTEROL TOTAL: 182 mg/dL (ref 100–199)
HDL: 50 mg/dL (ref >39)
LDL Calculated: 113 mg/dL — ABNORMAL HIGH (ref 0–99)
TRIGLYCERIDES: 97 mg/dL (ref 0–149)
VLDL Cholesterol Cal: 19 mg/dL (ref 5–40)

## 2017-02-05 LAB — COMPREHENSIVE METABOLIC PANEL
ALBUMIN: 4.2 g/dL (ref 3.5–5.5)
ALK PHOS: 71 IU/L (ref 39–117)
ALT: 15 IU/L (ref 0–32)
AST: 19 IU/L (ref 0–40)
Albumin/Globulin Ratio: 1.6 (ref 1.2–2.2)
BUN / CREAT RATIO: 17 (ref 9–23)
BUN: 14 mg/dL (ref 6–24)
Bilirubin Total: 0.6 mg/dL (ref 0.0–1.2)
CO2: 26 mmol/L (ref 18–29)
CREATININE: 0.84 mg/dL (ref 0.57–1.00)
Calcium: 10.4 mg/dL — ABNORMAL HIGH (ref 8.7–10.2)
Chloride: 99 mmol/L (ref 96–106)
GFR calc Af Amer: 94 mL/min/{1.73_m2} (ref >59)
GFR calc non Af Amer: 82 mL/min/{1.73_m2} (ref >59)
GLUCOSE: 90 mg/dL (ref 65–99)
Globulin, Total: 2.7 g/dL (ref 1.5–4.5)
Potassium: 4.7 mmol/L (ref 3.5–5.2)
Sodium: 138 mmol/L (ref 134–144)
Total Protein: 6.9 g/dL (ref 6.0–8.5)

## 2017-02-05 LAB — HEMOGLOBIN A1C
ESTIMATED AVERAGE GLUCOSE: 117 mg/dL
HEMOGLOBIN A1C: 5.7 % — AB (ref 4.8–5.6)

## 2017-02-05 LAB — TSH: TSH: 0.614 u[IU]/mL (ref 0.450–4.500)

## 2017-02-11 ENCOUNTER — Encounter: Payer: Self-pay | Admitting: Family Medicine

## 2017-02-17 ENCOUNTER — Other Ambulatory Visit: Payer: Self-pay | Admitting: Emergency Medicine

## 2017-02-17 MED ORDER — MELOXICAM 15 MG PO TABS: 15.0000 mg | ORAL_TABLET | Freq: Every day | ORAL | 0 refills | Status: DC

## 2017-02-18 ENCOUNTER — Other Ambulatory Visit: Payer: Self-pay

## 2017-02-18 MED ORDER — OLMESARTAN MEDOXOMIL 20 MG PO TABS: 10.0000 mg | ORAL_TABLET | Freq: Every day | ORAL | 1 refills | Status: DC

## 2017-02-19 ENCOUNTER — Other Ambulatory Visit: Payer: Self-pay | Admitting: Family Medicine

## 2017-02-19 NOTE — Telephone Encounter (Signed)
MyChart note to refill Lisinopril  See below.PATIENT WOULD LIKE DR. Tamala Julian TO KNOW THAT HER BLOOD PRESSURE HAS BEEN RUNNING HIGH. SHE NEEDS TO GET A REFILL ON THE HYDROCHLOROTHIAZIDE BUT INSTEAD OF 12.5 MG SHE WOULD LIKE TO HAVE 25 MG. SHE HAS BEEN TAKING 2 PILLS OF THE 12.5 MG. BEST PHONE [phone] (WORK FROM 8:00 AM TIL 5:00 PM. ANY OTHER TIME PLEASE CALL HER CELL [phone]. [facility]ARMACY [facility] [address] [facility] [place].

## 2017-02-19 NOTE — Telephone Encounter (Signed)
PATIENT WOULD LIKE DR. Tamala Julian TO KNOW THAT HER BLOOD PRESSURE HAS BEEN RUNNING HIGH. SHE NEEDS TO GET A REFILL ON THE HYDROCHLOROTHIAZIDE BUT INSTEAD OF 12.5 MG SHE WOULD LIKE TO HAVE 25 MG. SHE HAS BEEN TAKING 2 PILLS OF THE 12.5 MG. BEST PHONE [phone] (WORK FROM 8:00 AM TIL 5:00 PM. ANY OTHER TIME PLEASE CALL HER CELL [phone]. [facility]ARM[facility] [address] [facility] [place]. [place]

## 2017-02-21 MED ORDER — HYDROCHLOROTHIAZIDE 12.5 MG PO TABS: 25.0000 mg | ORAL_TABLET | Freq: Every day | ORAL | 1 refills | Status: DC

## 2017-02-21 MED ORDER — HYDROCHLOROTHIAZIDE 25 MG PO TABS: 25.0000 mg | ORAL_TABLET | Freq: Every day | ORAL | 1 refills | Status: DC

## 2017-02-28 MED ORDER — VALSARTAN 80 MG PO TABS: 80.0000 mg | ORAL_TABLET | Freq: Every day | ORAL | 5 refills | Status: DC

## 2017-02-28 NOTE — Addendum Note (Signed)
Addended by: Wardell Honour on: 02/28/2017 04:20 PM   Modules accepted: Orders

## 2017-03-05 ENCOUNTER — Ambulatory Visit (INDEPENDENT_AMBULATORY_CARE_PROVIDER_SITE_OTHER): Payer: Managed Care, Other (non HMO) | Admitting: Family Medicine

## 2017-03-05 ENCOUNTER — Encounter: Payer: Self-pay | Admitting: Family Medicine

## 2017-03-05 VITALS — BP 134/85 | HR 67 | Temp 97.8°F | Resp 16 | Ht 60.0 in | Wt 174.2 lb

## 2017-03-05 DIAGNOSIS — I10 Essential (primary) hypertension: Secondary | ICD-10-CM

## 2017-03-05 DIAGNOSIS — M5432 Sciatica, left side: Secondary | ICD-10-CM | POA: Diagnosis not present

## 2017-03-05 DIAGNOSIS — M4310 Spondylolisthesis, site unspecified: Secondary | ICD-10-CM

## 2017-03-05 DIAGNOSIS — M4696 Unspecified inflammatory spondylopathy, lumbar region: Secondary | ICD-10-CM

## 2017-03-05 DIAGNOSIS — M47816 Spondylosis without myelopathy or radiculopathy, lumbar region: Secondary | ICD-10-CM

## 2017-03-05 MED ORDER — METHOCARBAMOL 500 MG PO TABS: 500.0000 mg | ORAL_TABLET | Freq: Four times a day (QID) | ORAL | 0 refills | Status: DC

## 2017-03-05 MED ORDER — PREDNISONE 20 MG PO TABS: ORAL_TABLET | ORAL | 0 refills | Status: DC

## 2017-03-05 MED ORDER — HYDROCODONE-ACETAMINOPHEN 5-325 MG PO TABS: 1.0000 | ORAL_TABLET | Freq: Four times a day (QID) | ORAL | 0 refills | Status: DC | PRN

## 2017-03-05 NOTE — Progress Notes (Signed)
Subjective:    Patient ID: Lauren Paul, female    DOB: [birth year], 51 y.o.   MRN: [id number]  03/05/2017  Arthritis (leg pain) and Allergic Reaction   HPI This 51 y.o. female presents for evaluation of worsening L sciatica. Felt great on prednisone; no pain.  Three days after last dose of prednisone, pain recurred.  Severe pain for two weeks.  Radiation into L ankle; +numbness/tingling/burning.  B/b function WNL.  No saddle paresthesias.  Pain severity 8/10.  With two oxycodone in system, 5/10.  Horrible itchy with oxycodone.  Mild rash.  Had a massage but did not go well.  No n/t; +mild weakness in L leg.    Appointment with ortho June 20th, 2018 [facility].  BP Readings from Last 3 Encounters:  03/05/17 134/85  02/04/17 (!) 150/102  01/02/17 132/90    HTN: Valsartan 80mg  one tablet daily; took HCTZ 12.5mg  today but has 25mg  daily.  Blood pressure hsa been improved.   Review of Systems  Constitutional: Negative for chills, diaphoresis, fatigue and fever.  Eyes: Negative for visual disturbance.  Respiratory: Negative for cough and shortness of breath.   Cardiovascular: Negative for chest pain, palpitations and leg swelling.  Gastrointestinal: Negative for abdominal pain, constipation, diarrhea, nausea and vomiting.  Endocrine: Negative for cold intolerance, heat intolerance, polydipsia, polyphagia and polyuria.  Musculoskeletal: Positive for back pain and gait problem.  Neurological: Negative for dizziness, tremors, seizures, syncope, facial asymmetry, speech difficulty, weakness, light-headedness, numbness and headaches.    Past Medical History:  Diagnosis Date  . Allergy   . Anemia   . Arthritis   . Asthma   . Headache    migraines  . Hypersomnolence 06/19/2014  . Hypertension   . Meningitis   . Rectal fissure   . Sepsis ([place])   . Ulcer    Past Surgical History:  Procedure Laterality Date  . ABLATION    . DILATION AND CURETTAGE OF UTERUS    . TUBAL  LIGATION     Allergies  Allergen Reactions  . Benadryl [Diphenhydramine Hcl] Shortness Of Breath    Pt states she can take Childrens Benadryl dye free fine.  . Latex Swelling  . Penicillins Anaphylaxis    Pt states she can not take anything in the "cillin" family.  Marland Kitchen Apap-Pamabrom-Pyrilamine   . Iodides   . Losartan Other (See Comments)    LEG CRAMPS  . Olmesartan     Leg cramp   . Other     Watermelon- Anaphylaxis Nuts Melon  . Shellfish Allergy   . Sulfa Antibiotics     Social History   Social History  . Marital status: Single    Spouse name: N/A  . Number of children: 0  . Years of education: 12   Occupational History  . CLERICAL     [employer]   Social History Main Topics  . Smoking status: Never Smoker  . Smokeless tobacco: Never Used  . Alcohol use 1.2 oz/week    2 Standard drinks or equivalent per week     Comment: 2-3 glasses of wkly  . Drug use: No  . Sexual activity: Yes    Birth control/ protection: Surgical   Other Topics Concern  . Not on file   Social History Narrative   Marital status:  Single; not dating.  Not interested in 2018.      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      Tobacco:  None      Alcohol:  Weekends; 5 glasses of wine weekly      Exercise:  Sporadic.       Caffeine: One cup of caffeine daily.         Family History  Problem Relation Age of Onset  . Bladder Cancer Mother 58  . Uterine cancer Mother   . Cancer Mother 42       Uterine and bladder cancer  . Hypertension Mother   . Multiple sclerosis Mother   . Lung cancer Father   . Cancer Father 82       lung cancer  . Hypertension Sister        Objective:    BP 134/85 (BP Location: Right Arm, Patient Position: Sitting, Cuff Size: Large)   Pulse 67   Temp 97.8 F (36.6 C) (Oral)   Resp 16   Ht 5' (1.524 m)   Wt 174 lb 3.2 oz (79  kg)   SpO2 100%   BMI 34.02 kg/m  Physical Exam  Constitutional: She is oriented to person, place, and time. She appears well-developed and well-nourished. No distress.  HENT:  Head: Normocephalic and atraumatic.  Eyes: Conjunctivae are normal. Pupils are equal, round, and reactive to light.  Neck: Normal range of motion. Neck supple.  Cardiovascular: Normal rate, regular rhythm and normal heart sounds.  Exam reveals no gallop and no friction rub.   No murmur heard. Pulmonary/Chest: Effort normal and breath sounds normal. She has no wheezes. She has no rales.  Musculoskeletal:       Lumbar back: She exhibits decreased range of motion and pain. She exhibits no tenderness, no bony tenderness, no spasm and normal pulse.  Lumbar spine:  Non-tender midline; non-tender paraspinal regions B.  Straight leg raises positive B; toe and heel walking intact; marching intact; motor 5/5 BLE.  Decreased ROM lumbar spine with limitation in all directions.   Neurological: She is alert and oriented to person, place, and time.  Skin: She is not diaphoretic.  Psychiatric: She has a normal mood and affect. Her behavior is normal.  Nursing note and vitals reviewed.       Assessment & Plan:   1. Left sided sciatica   2. Facet arthropathy, lumbar ([place])   3. Anterolisthesis   4. Essential hypertension, benign    -acutely worsening; rx for Prednisone due to great relief recently with Prednisone -refer for MRI lumbar spine due to radicular symptoms and worsening pain. -refer to a different ortho facility for sooner appointment. -change Oxycodone to hydrocodone due to itching.   Orders Placed This Encounter  Procedures  . MR Lumbar Spine Wo Contrast    EPIC ORDER/[id number]/NOT CLAUS/PREV L-SPINE XRAYS 01/02/17 IN EPIC/NO PREV SX/NO PACEMAKER,STIMULATOR OR DEFIBRILLATOR/NO METAL INEYES/NO STENTS OR IMPLANTS/NO METAL DEVICES IN BODY/ PT HAS BRACES/NO NEEDS/INS-AETNA/CLC/PT    Standing Status:   Future     Standing Expiration Date:   05/05/2018    Order Specific Question:   Reason for Exam (SYMPTOM  OR DIAGNOSIS REQUIRED)    Answer:   L sided pain with radiation in L leg to ankle    Order Specific Question:   What is the patient's sedation requirement?    Answer:   No Sedation    Order Specific Question:   Does the patient have a pacemaker or implanted devices?  Answer:   No    Order Specific Question:   Preferred imaging location?    Answer:   [facility] (table limit-550lbs)    Order Specific Question:   Radiology Contrast Protocol - do NOT remove file path    Answer:   [email or web address]  . Ambulatory referral to Orthopedic Surgery    Referral Priority:   Routine    Referral Type:   Surgical    Referral Reason:   Specialty Services Required    Requested Specialty:   Orthopedic Surgery    Number of Visits Requested:   1   Meds ordered this encounter  Medications  . predniSONE (DELTASONE) 20 MG tablet    Sig: Three tablets daily x 2 days then two tablets daily x 5 days then one tablet daily x 5 days    Dispense:  21 tablet    Refill:  0  . methocarbamol (ROBAXIN) 500 MG tablet    Sig: Take 1-2 tablets (500-1,000 mg total) by mouth 4 (four) times daily.    Dispense:  60 tablet    Refill:  0  . HYDROcodone-acetaminophen (NORCO) 5-325 MG tablet    Sig: Take 1-2 tablets by mouth every 6 (six) hours as needed.    Dispense:  60 tablet    Refill:  0    No Follow-up on file.   Kristi Elayne Guerin, M.D. Primary Care at [facility] previously [facility] [place] [address] [place], [place]  [address] [phone] phone [phone] fax

## 2017-03-05 NOTE — Patient Instructions (Addendum)
HOLD MELOXICAM WHEN TAKING PREDNISONE.      We recommend that you schedule a mammogram for breast cancer screening. Typically, you do not need a referral to do this. Please contact a local imaging center to schedule your mammogram.  [facility] - [phone]  *ask for the Radiology Department [facility] - [phone] or [phone]  [facility] - [phone] [place] [phone] [facility] - [phone]  *ask for the [facility] - [phone]  *ask for the Radiology Department [facility] - [phone]  *ask for the [place] - [phone]    IF you received an x-ray today, you will receive an invoice from [facility]. Please contact [facility] Radiology at [phone] with questions or concerns regarding your invoice.   IF you received labwork today, you will receive an invoice from [facility]. Please contact [facility] at [phone] with questions or concerns regarding your invoice.   Our billing staff will not be able to assist you with questions regarding bills from these companies.  You will be contacted with the lab results as soon as they are available. The fastest way to get your results is to activate your My Chart account. Instructions are located on the last page of this paperwork. If you have not heard from [place] regarding the results in 2 weeks, please contact this office.

## 2017-03-13 ENCOUNTER — Telehealth: Payer: Self-pay | Admitting: Family Medicine

## 2017-03-13 NOTE — Telephone Encounter (Signed)
Pt called concerning MRI. Pt is scheduled for 03/25/17 at 3:20 pm. Pt will need prior auth through insurance before MRI. Will need OV notes completed in order to do this prior auth. Thanks!

## 2017-03-14 NOTE — Telephone Encounter (Signed)
Note completed; please proceed.

## 2017-03-18 ENCOUNTER — Other Ambulatory Visit: Payer: Self-pay | Admitting: Family Medicine

## 2017-03-19 NOTE — Telephone Encounter (Signed)
01/25/17 last refill

## 2017-03-21 ENCOUNTER — Telehealth: Payer: Self-pay | Admitting: Family Medicine

## 2017-03-21 NOTE — Telephone Encounter (Signed)
Authorization for pt MRI has been denied. A peer to peer can be done within 14 days from 03/20/17 or an appeal can be started within 180 days. Case number is [id number] and phone number for peer to peer is [phone] option 4. Pt is scheduled for MRI at [facility] on 5/5. Should we call pt to cancel this appt or can we get authorization in time? Please advise. Thanks!

## 2017-03-22 NOTE — Telephone Encounter (Signed)
Appt with [facility] is on 6/5 not 5/5

## 2017-03-24 NOTE — Telephone Encounter (Signed)
Spoke with pt to let her know about the insurance denial. Pt is going to cancel her appt for tomorrow and reschedule when we have let her know the insurance decision.

## 2017-03-25 ENCOUNTER — Other Ambulatory Visit: Payer: Self-pay

## 2017-03-26 ENCOUNTER — Ambulatory Visit (INDEPENDENT_AMBULATORY_CARE_PROVIDER_SITE_OTHER): Payer: Managed Care, Other (non HMO)

## 2017-03-26 ENCOUNTER — Ambulatory Visit (INDEPENDENT_AMBULATORY_CARE_PROVIDER_SITE_OTHER): Payer: Managed Care, Other (non HMO) | Admitting: Orthopaedic Surgery

## 2017-03-26 ENCOUNTER — Encounter (INDEPENDENT_AMBULATORY_CARE_PROVIDER_SITE_OTHER): Payer: Self-pay | Admitting: Orthopaedic Surgery

## 2017-03-26 VITALS — BP 120/83 | HR 62 | Ht 60.0 in | Wt 165.0 lb

## 2017-03-26 DIAGNOSIS — M5136 Other intervertebral disc degeneration, lumbar region: Secondary | ICD-10-CM

## 2017-03-26 NOTE — Progress Notes (Addendum)
Office Visit Note   Patient: Lauren Paul           Date of Birth: 1966/09/05           MRN: [id number] Visit Date: 03/26/2017              Requested by: Wardell Honour, MD [address] [place], [place] [address] PCP: Wardell Honour, MD   Assessment & Plan: Visit Diagnoses:  1. Other intervertebral disc degeneration, lumbar region           With 12 mm degenerative anterolisthesis L5-S1 and left leg radiculopathy and neurogenic claudication symptoms.  Plan: Patient has motor deficit left leg with weakness L5 nerve root likely from foraminal stenosis from 10 mm of degenerative anterolisthesis L5-S1 with likely spinal stenosis. She needs an MRI scan for evaluation. She had  3 months symptoms has been on prednisone, muscle relaxants, narcotic pain medication, anti-inflammatories. Return after MRI Follow-Up Instructions: No Follow-up on file.   Orders:  No orders of the defined types were placed in this encounter.  No orders of the defined types were placed in this encounter.     Procedures: No procedures performed   Clinical Data: No additional findings.   Subjective: Chief Complaint  Patient presents with  . Lower Back - Pain    HPI 51 year old female works 2 jobs had significant back pain for about 3 months. She's had 2 rounds of prednisone she's been on muscle relaxants she's been on hydrocodone for the pain she is taking different anti-inflammatories in addition Tylenol. She's had plain radiographs that showed the significant severe arthritis and 10 mm of anterolisthesis at L5-S1 level with severe facet arthropathy. Patient's having repetitive cramps at night that wake her up she has to stop rubber legs pain is severe enough to make her cry. They last sometimes up to 5 minutes. She states she cannot predict when she'll have them. She works in [employer], art. On her second job she does a lot of walking she states she can only walk about a block assess stop and sit down  and rest then she can repeat. She gets relief with supine position. She has less pain when she leans over grocery cart.  Review of Systems 14 point review of systems positive for hypertension. She has glucose intolerance prediabetic A1c's being followed. She has some obstructive sleep apnea she's tried to work on weight loss but the persistent back pain and leg pain symptoms have stopped her. Last potassium was 4.7. Past history of acid reflux, asthma, hypertension history of migraine sleep apnea teeth and gum disease.   Objective: Vital Signs: BP 120/83   Pulse 62   Ht 5' (1.524 m)   Wt 165 lb (74.8 kg)   BMI 32.22 kg/m   Physical Exam  Constitutional: She is oriented to person, place, and time. She appears well-developed.  HENT:  Head: Normocephalic.  Right Ear: External ear normal.  Left Ear: External ear normal.  Eyes: Pupils are equal, round, and reactive to light.  Neck: No tracheal deviation present. No thyromegaly present.  Cardiovascular: Normal rate.   Pulmonary/Chest: Effort normal.  Abdominal: Soft.  Musculoskeletal:  The patient seemed to her she has bilateral sciatic notch tenderness. Straight leg raising on the left at 70 positive popliteal compression test. She has slight anterior tib EHL weakness on the left none on the right. Distal pulses are intact no venous stasis changes no pitting edema. Hip range of motion Corky Sox test is negative. Test  tenderness of the lumbar spine she has some pain with forward flexion she lacks the 8 inches touching fingertips to floor with knees extended she uses her thighs with her hands to get back to erect position. Biopsy bilateral sciatic notch tenderness worse on the left than right.  Neurological: She is alert and oriented to person, place, and time.  Skin: Skin is warm and dry.  Psychiatric: She has a normal mood and affect. Her behavior is normal.    Ortho Exam  Specialty Comments:  No specialty comments  available.  Imaging: No results found.   PMFS History: Patient Active Problem List   Diagnosis Date Noted  . Obesity 08/27/2016  . OSA (obstructive sleep apnea) 08/12/2016  . Glucose intolerance (impaired glucose tolerance) 08/06/2016  . Menopausal hot flushes 08/06/2016  . Essential hypertension, benign 06/19/2014   Past Medical History:  Diagnosis Date  . Allergy   . Anemia   . Arthritis   . Asthma   . Headache    migraines  . Hypersomnolence 06/19/2014  . Hypertension   . Meningitis   . Rectal fissure   . Sepsis ([place])   . Ulcer     Family History  Problem Relation Age of Onset  . Bladder Cancer Mother 41  . Uterine cancer Mother   . Cancer Mother 31       Uterine and bladder cancer  . Hypertension Mother   . Multiple sclerosis Mother   . Lung cancer Father   . Cancer Father 22       lung cancer  . Hypertension Sister     Past Surgical History:  Procedure Laterality Date  . ABLATION    . DILATION AND CURETTAGE OF UTERUS    . 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 Smoking status: Never Smoker  . Smokeless tobacco: Never Used  . Alcohol use 1.2 oz/week    2 Standard drinks or equivalent per week     Comment: 2-3 glasses of wkly  . Drug use: No  . Sexual activity: Yes    Birth control/ protection: Surgical

## 2017-03-27 NOTE — Telephone Encounter (Signed)
Spoke to peer to peer coordinator; scheduled peer to peer at 6:15pm; physician is Dr. Pamala Hurry.  Pt saw ortho yesterday; recommended MRI due to three month duration of symptoms and LLE weakness.

## 2017-03-28 NOTE — Telephone Encounter (Signed)
Authorization for MRI has been approved. Auth number is [id number]. Will notify pt so she can reschedule. Thank you!

## 2017-03-31 ENCOUNTER — Encounter (INDEPENDENT_AMBULATORY_CARE_PROVIDER_SITE_OTHER): Payer: Self-pay | Admitting: Orthopaedic Surgery

## 2017-03-31 ENCOUNTER — Encounter: Payer: Self-pay | Admitting: Family Medicine

## 2017-04-10 ENCOUNTER — Ambulatory Visit
Admission: RE | Admit: 2017-04-10 | Discharge: 2017-04-10 | Disposition: A | Discharge status: Home / Self Care | Payer: Managed Care, Other (non HMO) | Source: Ambulatory Visit | Attending: Family Medicine | Admitting: Family Medicine

## 2017-04-10 DIAGNOSIS — M4310 Spondylolisthesis, site unspecified: Secondary | ICD-10-CM

## 2017-04-10 DIAGNOSIS — M47816 Spondylosis without myelopathy or radiculopathy, lumbar region: Secondary | ICD-10-CM

## 2017-04-10 DIAGNOSIS — M5432 Sciatica, left side: Secondary | ICD-10-CM

## 2017-04-18 ENCOUNTER — Ambulatory Visit (INDEPENDENT_AMBULATORY_CARE_PROVIDER_SITE_OTHER): Payer: Managed Care, Other (non HMO) | Admitting: Orthopaedic Surgery

## 2017-04-18 DIAGNOSIS — M48062 Spinal stenosis, lumbar region with neurogenic claudication: Secondary | ICD-10-CM

## 2017-04-18 NOTE — Progress Notes (Signed)
Office Visit Note   Patient: Lauren Paul           Date of Birth: October 30, 1965           MRN: [id number] Visit Date: 04/18/2017              Requested by: Wardell Honour, MD [address] [place], [place] [address] PCP: Wardell Honour, MD   Assessment & Plan: Visit Diagnoses:  1. Spinal stenosis of lumbar region with neurogenic claudication       L5-S1 instability with anterolisthesis lateral recess and by foraminal stenosis with intact pars.  Plan: We discussed activity she could work on help work on losing some weight but would not aggravate her lumbar symptoms including swimming, stationary bike,, supine stationary bike. I reviewed the MRI scan with her and discussed with her that she has had progressive instability develop at the bottom level. She has some mild changes of 45 without stenosis and basically has single level disease at this point. She can walk 100 200 feet cannot stand more than 5 minutes and she understands the surgical decompression and fusion would be required for relief of her symptoms. We discussed operative technique use of a brace, usual tinnitus in the hospital. Usual 2 months out of work. Wrist surgery including pseudoarthrosis reoperation dural tear/repair and potential for development of changes at other levels in the lumbar spine in the future. I will check back again in one month. She is fitted with a lumbar corset that she can use intermittently to help her symptoms.  Follow-Up Instructions: Return in about 1 month (around 05/18/2017).   Orders:  No orders of the defined types were placed in this encounter.  No orders of the defined types were placed in this encounter.     Procedures: No procedures performed   Clinical Data: No additional findings.   Subjective: No chief complaint on file.   HPI 51 year old female returns with persistent claudication symptoms with pain with standing pain with walking and left leg pain with weakness. She has 10  mm's of anterolisthesis on standing x-ray and 8 mm in the supine position with the MRI scan. Patient been treated with anti-inflammatories pain medication, prednisone dosepak, muscle relaxants activity modification and therapy exercises without relief. MRI scan has been performed and is reviewed with patient.  Review of Systems 14 part review of systems is updated from 03/26/2017 office visit and is unchanged. Of note is prediabetes, sleep apnea, asthma hypertension. Otherwise negative as it pertains history of present illness.   Objective: Vital Signs: There were no vitals taken for this visit.  Physical Exam  Constitutional: She is oriented to person, place, and time. She appears well-developed.  HENT:  Head: Normocephalic.  Right Ear: External ear normal.  Left Ear: External ear normal.  Eyes: Pupils are equal, round, and reactive to light.  Neck: No tracheal deviation present. No thyromegaly present.  Cardiovascular: Normal rate.   Pulmonary/Chest: Effort normal.  Abdominal: Soft.  Neurological: She is alert and oriented to person, place, and time.  Skin: Skin is warm and dry.  Psychiatric: She has a normal mood and affect. Her behavior is normal.    Ortho Exam patient's pelvis is level. She has some bilateral tight hamstrings. Her test is negative. There sciatic notch tenderness on the left positive straight leg raising on the left at 70. Negative on the right. She has some anterior tib EHL weakness on the left normal testing on the right. Gastrocsoleus a strong  bilaterally. Moderate trochanteric bursal tenderness.  Specialty Comments:  No specialty comments available.  Imaging: CLINICAL DATA:  Low back pain. BILATERAL leg pain, numbness, and weakness, LEFT greater than RIGHT, for 6 months.  EXAM: MRI LUMBAR SPINE WITHOUT CONTRAST  TECHNIQUE: Multiplanar, multisequence MR imaging of the lumbar spine was performed. No intravenous contrast was administered.  COMPARISON:   None.  FINDINGS: Segmentation:  Standard.  Alignment: There is 8 mm anterolisthesis L5 on S1. I do not see evidence for pars defects.  Vertebrae:  No worrisome osseous lesion.  Conus medullaris: The cord is abnormally low, terminating the L2-L3 disc space. I do not see of lipoma of the filum terminale, or other evidence of spinal dysraphism.  Paraspinal and other soft tissues: Unremarkable.  Disc levels:  L1-L2: Advanced disc space narrowing. Central extrusion, with facet arthropathy. No impingement.  L2-L3:  Normal.  L3-L4:  Normal disc space.  Facet arthropathy.  No impingement.  L4-L5: Annular tear. Advanced posterior element hypertrophy. Short pedicles. Mild stenosis without definite impingement.  L5-S1: Advanced facet arthropathy and ligamentum flavum hypertrophy. 8 mm anterolisthesis. Uncovering of the disc. No pars defects are seen. Moderate stenosis. LEFT greater than RIGHT subarticular zone and foraminal zone narrowing could affect the L5 and S1 nerve roots.  IMPRESSION: 8 mm anterolisthesis at L5-S1 associated with posterior element hypertrophy. No pars defects are seen. Moderate stenosis. LEFT greater than RIGHT subarticular zone and foraminal zone narrowing could affect the L5 and S1 nerve roots.  Central extrusion at L1-2.  No definite impingement.  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 No other evidence for occult spinal dysraphism.  Advanced posterior element hypertrophy at L4-5, no definite impingement.   Electronically Signed   By: Staci Righter M.D.   On: 04/10/2017 08:39    PMFS History: Patient Active Problem List   Diagnosis Date Noted  . Obesity 08/27/2016  . OSA (obstructive sleep apnea) 08/12/2016  . Glucose intolerance (impaired glucose tolerance) 08/06/2016  . Menopausal hot flushes 08/06/2016  . Essential hypertension, benign 06/19/2014   Past Medical History:  Diagnosis Date  . Allergy   .  Anemia   . Arthritis   . Asthma   . Headache    migraines  . Hypersomnolence 06/19/2014  . Hypertension   . Meningitis   . Rectal fissure   . Sepsis ([place])   . Ulcer     Family History  Problem Relation Age of Onset  . Bladder Cancer Mother 15  . Uterine cancer Mother   . Cancer Mother 23       Uterine and bladder cancer  . Hypertension Mother   . Multiple sclerosis Mother   . Lung cancer Father   . Cancer Father 52       lung cancer  . Hypertension Sister     Past Surgical History:  Procedure Laterality Date  . ABLATION    . DILATION AND CURETTAGE OF UTERUS    . 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 Smoking status: Never Smoker  . Smokeless tobacco: Never Used  . Alcohol use 1.2 oz/week    2 Standard drinks or equivalent per week     Comment: 2-3 glasses of wkly  . Drug use: No  . Sexual activity: Yes    Birth control/ protection: Surgical

## 2017-04-20 DIAGNOSIS — M48062 Spinal stenosis, lumbar region with neurogenic claudication: Secondary | ICD-10-CM | POA: Insufficient documentation

## 2017-04-24 ENCOUNTER — Encounter: Payer: Self-pay | Admitting: Family Medicine

## 2017-05-15 ENCOUNTER — Encounter: Payer: Self-pay | Admitting: Family Medicine

## 2017-05-15 DIAGNOSIS — M4310 Spondylolisthesis, site unspecified: Secondary | ICD-10-CM

## 2017-05-20 ENCOUNTER — Ambulatory Visit (INDEPENDENT_AMBULATORY_CARE_PROVIDER_SITE_OTHER): Payer: Managed Care, Other (non HMO) | Admitting: Orthopaedic Surgery

## 2017-05-25 MED ORDER — METOPROLOL SUCCINATE ER 25 MG PO TB24: 25.0000 mg | ORAL_TABLET | Freq: Every day | ORAL | 5 refills | Status: DC

## 2017-07-21 ENCOUNTER — Encounter: Payer: Self-pay | Admitting: Pediatrics

## 2017-07-21 ENCOUNTER — Ambulatory Visit (INDEPENDENT_AMBULATORY_CARE_PROVIDER_SITE_OTHER): Payer: Managed Care, Other (non HMO) | Admitting: Pediatrics

## 2017-07-21 VITALS — BP 120/84 | HR 67 | Temp 97.9°F | Resp 16 | Ht 60.24 in | Wt 169.8 lb

## 2017-07-21 DIAGNOSIS — T7800XA Anaphylactic reaction due to unspecified food, initial encounter: Secondary | ICD-10-CM | POA: Insufficient documentation

## 2017-07-21 DIAGNOSIS — Z889 Allergy status to unspecified drugs, medicaments and biological substances status: Secondary | ICD-10-CM

## 2017-07-21 DIAGNOSIS — T886XXD Anaphylactic reaction due to adverse effect of correct drug or medicament properly administered, subsequent encounter: Secondary | ICD-10-CM

## 2017-07-21 DIAGNOSIS — J4520 Mild intermittent asthma, uncomplicated: Secondary | ICD-10-CM

## 2017-07-21 DIAGNOSIS — T7800XD Anaphylactic reaction due to unspecified food, subsequent encounter: Secondary | ICD-10-CM | POA: Diagnosis not present

## 2017-07-21 DIAGNOSIS — M48062 Spinal stenosis, lumbar region with neurogenic claudication: Secondary | ICD-10-CM

## 2017-07-21 DIAGNOSIS — J3089 Other allergic rhinitis: Secondary | ICD-10-CM | POA: Diagnosis not present

## 2017-07-21 MED ORDER — EPINEPHRINE 0.3 MG/0.3ML IJ SOAJ: INTRAMUSCULAR | 2 refills | Status: DC

## 2017-07-21 MED ORDER — ALBUTEROL SULFATE HFA 108 (90 BASE) MCG/ACT IN AERS: 2.0000 | INHALATION_SPRAY | RESPIRATORY_TRACT | 2 refills | Status: DC | PRN

## 2017-07-21 NOTE — Patient Instructions (Addendum)
Skin testing to Depo-Medrol 40 mg was negative. She tolerated 4 mg intramuscularly of Depo-Medrol 40 mg per mL and did not have any itching. About 15 minutes after giving her 36 mg of Depo-Medrol IM, she developed itching which was generalized but no actual hives. I gave her cetirizine 10 mg for the itching It is my impression that a different injectable steroid should be tried in the future. She can be tested for that steroid and given a test dose of it  like we did today  Zyrtec 10 mg once a day if needed for runny nose or itching Pro-air 2 puffs every 4 hours if needed for wheezing or coughing spells Avoid peanuts , tree nuts, shellfish, watermelon. If you have an allergic reaction take Benadryl dye free 50 mg every 4 hours and if you have life-threatening symptoms inject with EpiPen 0.3 mg

## 2017-07-21 NOTE — Progress Notes (Signed)
[place] [address] Dept: [phone]  New Patient Note  Patient ID: Lauren Paul, female    DOB: 18-Jan-1966  Age: 51 y.o. MRN: [id number] Date of Office Visit: 07/21/2017 Referring provider: Wardell Honour, MD [place], [place] [address]    Chief Complaint: Allergic Reaction (after receiving a depo medrol injection about 2 weeks ago itching started almost immediately.)  HPI Lauren Paul presents for Evaluation of a possible allergy to Depo-Medrol. About 2 weeks ago she was given an epidural injection with Depo-Medrol and within minutes began to itch. She took Benadryl twice and 6 hours later the itching was gone. She never developed hives. She has to use dye free Benadryl because the red dye makes her breakout. She has a history of asthma and her asthma is well controlled.. She has a history of allergic rhinitis to dust mite and mold. Her nasal symptoms are well controlled.. She is allergic to nuts, peanuts, watermelon, melons, shellfish  Review of Systems  Constitutional: Negative.   HENT:       Nasal congestion for several years aggravated by exposure to dust  Eyes: Negative.   Respiratory:       History of asthma but well controlled without medications  Cardiovascular:       Hypertension. One episode of ventricular tachycardia when she had sepsis  Gastrointestinal: Negative.   Genitourinary:       Tubal ablation in [id number] complicated by sepsis  Musculoskeletal:       Spinal stenosis in the lumbar region with neurogenic claudication  Skin:       History of hives from foods, latex and Benadryl  Neurological:       Spinal stenosis  Endo/Heme/Allergies:       No diabetes or thyroid disease  Psychiatric/Behavioral: Negative.     Outpatient Encounter Prescriptions as of 07/21/2017  Medication Sig  . escitalopram (LEXAPRO) 20 MG tablet Take 1 tablet (20 mg total) by mouth daily.  Marland Kitchen gabapentin (NEURONTIN) 300 MG capsule TAKE 1 CAPSULE BY MOUTH  THREE TIMES A DAY  . hydrochlorothiazide (HYDRODIURIL) 12.5 MG tablet TAKE 1 TABLET (12.5 MG TOTAL) BY MOUTH DAILY.  . metoprolol succinate (TOPROL-XL) 25 MG 24 hr tablet Take 1 tablet (25 mg total) by mouth daily.  . Multiple Vitamin (MULTIVITAMIN) capsule Take 1 capsule by mouth daily.  . traZODone (DESYREL) 50 MG tablet TAKE 1-2 TABLETS BY MOUTH AT BEDTIME AS NEEDED FOR SLEEP  . VIMOVO 500-20 MG TBEC TAKE ONE TABLET BY MOUTH TWICE DAILY 30 MINUTES BEFORE MEALS  . albuterol (PROAIR HFA) 108 (90 Base) MCG/ACT inhaler Inhale 2 puffs into the lungs every 4 (four) hours as needed for wheezing or shortness of breath.  . EPINEPHrine (EPIPEN 2-PAK) 0.3 mg/0.3 mL IJ SOAJ injection Use as directed for severe allergic reaction  . [DISCONTINUED] hydrochlorothiazide (HYDRODIURIL) 25 MG tablet Take 1 tablet (25 mg total) by mouth daily.  . [DISCONTINUED] HYDROcodone-acetaminophen (NORCO) 5-325 MG tablet Take 1-2 tablets by mouth every 6 (six) hours as needed. (Patient not taking: Reported on 03/26/2017)  . [DISCONTINUED] meloxicam (MOBIC) 15 MG tablet Take 1 tablet (15 mg total) by mouth daily. (Patient not taking: Reported on 03/26/2017)  . [DISCONTINUED] methocarbamol (ROBAXIN) 500 MG tablet Take 1-2 tablets (500-1,000 mg total) by mouth 4 (four) times daily. (Patient not taking: Reported on 03/26/2017)  . [DISCONTINUED] predniSONE (DELTASONE) 20 MG tablet Three tablets daily x 2 days then two tablets daily x 5 days then one tablet daily  x 5 days (Patient not taking: Reported on 03/26/2017)  . [DISCONTINUED] valsartan (DIOVAN) 80 MG tablet Take 1 tablet (80 mg total) by mouth daily.   No facility-administered encounter medications on file as of 07/21/2017.      Drug Allergies:  Allergies  Allergen Reactions  . Benadryl [Diphenhydramine Hcl] Shortness Of Breath    Pt states she can take Childrens Benadryl dye free fine.  . Diphenhydramine Hcl Anaphylaxis and Swelling  . Doxycycline Swelling  . Latex Swelling    . Penicillins Anaphylaxis    Pt states she can not take anything in the "cillin" family.  Marland Kitchen Apap-Pamabrom-Pyrilamine   . Iodides   . Losartan Other (See Comments)    LEG CRAMPS  . Olmesartan     Leg cramp   . Other     Watermelon- Anaphylaxis Nuts Melon  . Shellfish Allergy   . Sulfa Antibiotics   . Valsartan     Fatigue and alopecia    Family History: Lauren Paul's family history includes Bladder Cancer (age of onset: 48) in her mother; Cancer (age of onset: 67) in her father; Cancer (age of onset: 66) in her mother; Hypertension in her mother and sister; Lung cancer in her father; Multiple sclerosis in her mother; Uterine cancer in her mother..Family history is negative for asthma, hayfever, sinus problems, angioedema, eczema, hives, food allergies, lupus.  Social and environmental. She has a cat in the home. She is not exposed to cigarette smoking.. She has not smoked cigarettes in the past.  Physical Exam: BP 120/84 (BP Location: Right Arm, Patient Position: Sitting, Cuff Size: Normal)   Pulse 67   Temp 97.9 F (36.6 C) (Oral)   Resp 16   Ht 5' 0.24" (1.53 m)   Wt 169 lb 12.1 oz (77 kg)   SpO2 98%   BMI 32.89 kg/m    Physical Exam  Constitutional: She is oriented to person, place, and time. She appears well-developed and well-nourished.  HENT:  Eyes normal. Ears normal. Nose normal. Pharynx normal.  Neck: Neck supple. No thyromegaly present.  Cardiovascular:  S1 and S2 normal no murmurs  Pulmonary/Chest:  Clear to percussion and auscultation  Abdominal: There is no tenderness (no hepatosplenomegaly).  Lymphadenopathy:    She has no cervical adenopathy.  Neurological: She is alert and oriented to person, place, and time.  Skin:  Clear but she had dermographia noted  Psychiatric: She has a normal mood and affect. Her behavior is normal. Judgment and thought content normal.  Vitals reviewed.   Diagnostics: FVC 2.74 L FEV1 2.29 L. Predicted FVC 2.45 L predicted  FEV1 1.97 L. After albuterol 2 puffs FVC 2.90 L FEV1 2.39 L-the spirometry is in the normal range and there was no significant improvement after albuterol   Assessment  Assessment and Plan: 1. Multiple drug allergies   2. Anaphylactic shock due to food, subsequent encounter   3. Mild intermittent asthma without complication   4. Other allergic rhinitis   5. Spinal stenosis of lumbar region with neurogenic claudication   6. Anaphylactic shock, due to adverse effect of correct medicinal substance properly administered, subsequent encounter   6.     Allergy to Depo-Medrol 40 mg per mL-[employer]  Meds ordered this encounter  Medications  . EPINEPHrine (EPIPEN 2-PAK) 0.3 mg/0.3 mL IJ SOAJ injection    Sig: Use as directed for severe allergic reaction    Dispense:  2 Device    Refill:  2    Pt will call  .  albuterol (PROAIR HFA) 108 (90 Base) MCG/ACT inhaler    Sig: Inhale 2 puffs into the lungs every 4 (four) hours as needed for wheezing or shortness of breath.    Dispense:  1 Inhaler    Refill:  2    Pt will call    Patient Instructions  Skin testing to Depo-Medrol 40 mg was negative. She tolerated 4 mg intramuscularly of Depo-Medrol 40 mg per mL and did not have any itching. About 15 minutes after giving her 36 mg of Depo-Medrol IM, she developed itching which was generalized but no actual hives. I gave her cetirizine 10 mg for the itching It is my impression that a different injectable steroid should be tried in the future. She can be tested for that steroid and given a test dose of it  like we did today  Zyrtec 10 mg once a day if needed for runny nose or itching Pro-air 2 puffs every 4 hours if needed for wheezing or coughing spells Avoid peanuts , tree nuts, shellfish, watermelon. If you have an allergic reaction take Benadryl dye free 50 mg every 4 hours and if you have life-threatening symptoms inject with EpiPen 0.3 mg   Return if symptoms worsen or fail to improve.   Thank you  for the opportunity to care for this patient.  Please do not hesitate to contact me with questions.  Penne Lash, M.D.  [facility] [address] [place], [place] [address] [phone]

## 2017-07-23 DIAGNOSIS — T886XXA Anaphylactic reaction due to adverse effect of correct drug or medicament properly administered, initial encounter: Secondary | ICD-10-CM | POA: Insufficient documentation

## 2017-07-23 MED ORDER — EPINEPHRINE 0.3 MG/0.3ML IJ SOAJ: INTRAMUSCULAR | 1 refills | Status: DC

## 2017-07-24 ENCOUNTER — Encounter: Payer: Self-pay | Admitting: Family Medicine

## 2017-07-25 NOTE — Telephone Encounter (Signed)
Patient calling to see if Dr Tamala Julian had gotten her e-mail she need her forms filled out by Monday I advised pt that Dr Tamala Julian want be in until Monday

## 2017-07-29 ENCOUNTER — Telehealth: Payer: Self-pay | Admitting: Family Medicine

## 2017-07-29 NOTE — Telephone Encounter (Signed)
Pt is checking on status of paperwork she left for Dr. Tamala Julian regarding her job at [employer] number [phone]

## 2017-07-31 NOTE — Telephone Encounter (Signed)
Please advise 

## 2017-07-31 NOTE — Telephone Encounter (Signed)
Pt is calling again to check on the status of her forms.  They were supposed to be turned in Monday.  Please advise

## 2017-08-06 ENCOUNTER — Ambulatory Visit (INDEPENDENT_AMBULATORY_CARE_PROVIDER_SITE_OTHER): Payer: Managed Care, Other (non HMO) | Admitting: Family Medicine

## 2017-08-06 ENCOUNTER — Encounter: Payer: Self-pay | Admitting: Family Medicine

## 2017-08-06 VITALS — BP 122/82 | HR 85 | Temp 98.0°F | Resp 16 | Ht 60.24 in | Wt 170.0 lb

## 2017-08-06 DIAGNOSIS — E7439 Other disorders of intestinal carbohydrate absorption: Secondary | ICD-10-CM | POA: Diagnosis not present

## 2017-08-06 DIAGNOSIS — E7800 Pure hypercholesterolemia, unspecified: Secondary | ICD-10-CM | POA: Diagnosis not present

## 2017-08-06 DIAGNOSIS — Z6832 Body mass index (BMI) 32.0-32.9, adult: Secondary | ICD-10-CM | POA: Diagnosis not present

## 2017-08-06 DIAGNOSIS — E6609 Other obesity due to excess calories: Secondary | ICD-10-CM | POA: Diagnosis not present

## 2017-08-06 DIAGNOSIS — M5432 Sciatica, left side: Secondary | ICD-10-CM

## 2017-08-06 DIAGNOSIS — I10 Essential (primary) hypertension: Secondary | ICD-10-CM

## 2017-08-06 DIAGNOSIS — N951 Menopausal and female climacteric states: Secondary | ICD-10-CM

## 2017-08-06 MED ORDER — TRAZODONE HCL 50 MG PO TABS: 50.0000 mg | ORAL_TABLET | Freq: Every evening | ORAL | 3 refills | Status: DC | PRN

## 2017-08-06 MED ORDER — HYDROCHLOROTHIAZIDE 12.5 MG PO TABS: ORAL_TABLET | ORAL | 1 refills | Status: DC

## 2017-08-06 MED ORDER — METOPROLOL SUCCINATE ER 25 MG PO TB24: 25.0000 mg | ORAL_TABLET | Freq: Every day | ORAL | 1 refills | Status: DC

## 2017-08-06 NOTE — Progress Notes (Signed)
Subjective:    Patient ID: Lauren Paul, female    DOB: 06-12-1966, 51 y.o.   MRN: [id number]  08/06/2017  Hypertension (follow-up)    HPI This 51 y.o. female presents for six month follow-up hypertension, hot flashes, obesity, insomnia, and L sciatica.  Calcium of 10.4.  No changes to management made at last visit; referred to orthopedics due to severe L sciatica despite conservative treatment.   L sciatica: s/p injection one with great benefit.  Taking Gabapentin two daily; one every morning and one midday.   Started itching immediately after last injection; started itching upper back.  Thought maybe that chair was latex.  Itching all over.  Had to buy some Benadryl two doses to stop.  Went to allergy provider; used steroid in office and itched worse.  Must need a different steroid.  Odd reaction.  BP shot up with reaction. Recommended going home.   [facility] on [facility]17 in [place].  Hypertension: walking daily; 120s/80s.  Feels great; determined to lose weight.  Cholesterol and sugars elevated at last visit.  BP Readings from Last 3 Encounters:  08/06/17 122/82  07/21/17 120/84  03/26/17 120/83   Wt Readings from Last 3 Encounters:  08/06/17 170 lb (77.1 kg)  07/21/17 169 lb 12.1 oz (77 kg)  03/26/17 165 lb (74.8 kg)   Immunization History  Administered Date(s) Administered  . Tdap 05/11/2014    Review of Systems  Constitutional: Negative for chills, diaphoresis, fatigue and fever.  Eyes: Negative for visual disturbance.  Respiratory: Negative for cough and shortness of breath.   Cardiovascular: Negative for chest pain, palpitations and leg swelling.  Gastrointestinal: Negative for abdominal pain, constipation, diarrhea, nausea and vomiting.  Endocrine: Negative for cold intolerance, heat intolerance, polydipsia, polyphagia and polyuria.  Musculoskeletal: Positive for back pain.  Skin: Positive for rash.  Neurological: Negative for dizziness, tremors,  seizures, syncope, facial asymmetry, speech difficulty, weakness, light-headedness, numbness and headaches.  Psychiatric/Behavioral: Negative for sleep disturbance.    Past Medical History:  Diagnosis Date  . Allergy   . Anemia   . Arthritis   . Asthma   . Headache    migraines  . Hypersomnolence 06/19/2014  . Hypertension   . Meningitis   . Rectal fissure   . Sepsis ([place])   . Ulcer    Past Surgical History:  Procedure Laterality Date  . ABLATION    . DILATION AND CURETTAGE OF UTERUS    . TUBAL LIGATION     Allergies  Allergen Reactions  . Benadryl [Diphenhydramine Hcl] Shortness Of Breath    Pt states she can take Childrens Benadryl dye free fine.  . Diphenhydramine Hcl Anaphylaxis and Swelling  . Doxycycline Swelling  . Latex Swelling  . Penicillins Anaphylaxis    Pt states she can not take anything in the "cillin" family.  Marland Kitchen Apap-Pamabrom-Pyrilamine   . Iodides   . Losartan Other (See Comments)    LEG CRAMPS  . Olmesartan     Leg cramp   . Other     Watermelon- Anaphylaxis Nuts Melon  . Shellfish Allergy   . Sulfa Antibiotics   . Valsartan     Fatigue and alopecia   Current Outpatient Prescriptions on File Prior to Visit  Medication Sig Dispense Refill  . albuterol (PROAIR HFA) 108 (90 Base) MCG/ACT inhaler Inhale 2 puffs into the lungs every 4 (four) hours as needed for wheezing or shortness of breath. 1 Inhaler 2  . EPINEPHrine (EPIPEN 2-PAK) 0.3 mg/0.3 mL  IJ SOAJ injection Use as directed for severe allergic reaction 2 Device 2  . EPINEPHrine (EPIPEN 2-PAK) 0.3 mg/0.3 mL IJ SOAJ injection Use as directed for severe allergic reaction. 2 Device 1  . escitalopram (LEXAPRO) 20 MG tablet Take 1 tablet (20 mg total) by mouth daily. 90 tablet 1  . gabapentin (NEURONTIN) 300 MG capsule TAKE 1 CAPSULE BY MOUTH THREE TIMES A DAY  0  . Multiple Vitamin (MULTIVITAMIN) capsule Take 1 capsule by mouth daily.    Marland Kitchen VIMOVO 500-20 MG TBEC TAKE ONE TABLET BY MOUTH TWICE  DAILY 30 MINUTES BEFORE MEALS  1   No current facility-administered medications on file prior to visit.    Social History   Social History  . Marital status: Single    Spouse name: N/A  . Number of children: 0  . Years of education: 12   Occupational History  . CLERICAL     [employer]   Social History Main Topics  . Smoking status: Never Smoker  . Smokeless tobacco: Never Used  . Alcohol use 1.2 oz/week    2 Standard drinks or equivalent per week     Comment: 2-3 glasses of wkly  . Drug use: No  . Sexual activity: Yes    Birth control/ protection: Surgical   Other Topics Concern  . Not on file   Social History Narrative   Marital status:  Single; not dating.  Not interested in 2018.      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      Tobacco:  None      Alcohol:  Weekends; 5 glasses of wine weekly      Exercise:  Sporadic.       Caffeine: One cup of caffeine daily.         Family History  Problem Relation Age of Onset  . Bladder Cancer Mother 41  . Uterine cancer Mother   . Cancer Mother 43       Uterine and bladder cancer  . Hypertension Mother   . Multiple sclerosis Mother   . Lung cancer Father   . Cancer Father 44       lung cancer  . Hypertension Sister   . Allergic rhinitis Neg Hx   . Angioedema Neg Hx   . Asthma Neg Hx   . Eczema Neg Hx   . Immunodeficiency Neg Hx   . Urticaria Neg Hx        Objective:    BP 122/82   Pulse 85   Temp 98 F (36.7 C) (Oral)   Resp 16   Ht 5' 0.24" (1.53 m)   Wt 170 lb (77.1 kg)   SpO2 97%   BMI 32.94 kg/m  Physical Exam  Constitutional: She is oriented to person, place, and time. She appears well-developed and well-nourished. No distress.  HENT:  Head: Normocephalic and atraumatic.  Right Ear: External ear normal.  Left Ear: External ear normal.  Nose: Nose normal.    Mouth/Throat: Oropharynx is clear and moist.  Eyes: Pupils are equal, round, and reactive to light. Conjunctivae and EOM are normal.  Neck: Normal range of motion. Neck supple. Carotid bruit is not present. No thyromegaly present.  Cardiovascular: Normal rate, regular rhythm, normal heart sounds and intact distal pulses.  Exam reveals no gallop and  no friction rub.   No murmur heard. Pulmonary/Chest: Effort normal and breath sounds normal. She has no wheezes. She has no rales.  Abdominal: Soft. Bowel sounds are normal. She exhibits no distension and no mass. There is no tenderness. There is no rebound and no guarding.  Lymphadenopathy:    She has no cervical adenopathy.  Neurological: She is alert and oriented to person, place, and time. No cranial nerve deficit.  Skin: Skin is warm and dry. No rash noted. She is not diaphoretic. No erythema. No pallor.  Psychiatric: She has a normal mood and affect. Her behavior is normal.   No results found. Depression screen [facility] 2/9 08/06/2017 03/05/2017 02/04/2017 01/02/2017 08/06/2016  Decreased Interest 0 0 0 0 0  Down, Depressed, Hopeless 0 0 0 0 0  PHQ - 2 Score 0 0 0 0 0   Fall Risk  08/06/2017 03/05/2017 02/04/2017 01/02/2017 08/06/2016  Falls in the past year? No No No No No        Assessment & Plan:   1. Essential hypertension, benign   2. Glucose intolerance   3. Pure hypercholesterolemia   4. Left sided sciatica   5. Menopausal hot flushes   6. Class 1 obesity due to excess calories with serious comorbidity and body mass index (BMI) of 32.0 to 32.9 in adult    -controlled blood pressure and menopausal hot flashes; obtain labs for chronic disease management. - I recommend weight loss, exercise, and low-carbohydrate low-sugar food choices. You should AVOID: regular sodas, sweetened tea, fruit juices.  You should LIMIT: breads, pastas, rice, potatoes, and desserts/sweets.  I would recommend limiting your total carbohydrate intake per meal to  45 grams; I would limit your total carbohydrate intake per snack to 30 grams.  I would also have a goal of 60 grams of protein intake per day; this would equal 10-15 grams of protein per meal and 5-10 grams of protein per snack. --recommend weight loss, exercise for 30-60 minutes five days per week; recommend 1200 kcal restriction per day with a minimum of 60 grams of protein per day. -s/p orthopedic consultation for L sciatica; s/p injection in September 2018 with great improvement in L sciatica pain; doing well; completion of FMLA paperwork completed for [employer]. -concern about elevation of cholesterol and sugars; highly recommend flu vaccine. -PATIENT REFUSES FLU VACCINE. -WANTS TO WAIT ON COLONOSCOPY.  Orders Placed This Encounter  Procedures  . CBC with Differential/Platelet  . Hemoglobin A1c  . Lipid panel    Order Specific Question:   Has the patient fasted?    Answer:   No  . Comprehensive metabolic panel    Order Specific Question:   Has the patient fasted?    Answer:   No   Meds ordered this encounter  Medications  . hydrochlorothiazide (HYDRODIURIL) 12.5 MG tablet    Sig: TAKE 1 TABLET (12.5 MG TOTAL) BY MOUTH DAILY.    Dispense:  90 tablet    Refill:  1  . metoprolol succinate (TOPROL-XL) 25 MG 24 hr tablet    Sig: Take 1 tablet (25 mg total) by mouth daily.    Dispense:  90 tablet    Refill:  1  . traZODone (DESYREL) 50 MG tablet    Sig: Take 1-2 tablets (50-100 mg total) by mouth at bedtime as needed. for sleep    Dispense:  90 tablet    Refill:  3    Return in about 6 months (around 02/04/2018) for complete physical examiniation.  Johanna Stafford Elayne Guerin, M.D. Primary Care at [facility] [place] [address] [place], [place]  [address] [phone] phone [phone] fax

## 2017-08-06 NOTE — Patient Instructions (Addendum)
   IF you received an x-ray today, you will receive an invoice from [facility]. Please contact [facility] at [phone] with questions or concerns regarding your invoice.   IF you received labwork today, you will receive an invoice from [facility]. Please contact [facility] at [phone] with questions or concerns regarding your invoice.   Our billing staff will not be able to assist you with questions regarding bills from these companies.  You will be contacted with the lab results as soon as they are available. The fastest way to get your results is to activate your My Chart account. Instructions are located on the last page of this paperwork. If you have not heard from us regarding the results in 2 weeks, please contact this office.      Fat and Cholesterol Restricted Diet Getting too much fat and cholesterol in your diet may cause health problems. Following this diet helps keep your fat and cholesterol at normal levels. This can keep you from getting sick. What types of fat should I choose?  Choose monosaturated and polyunsaturated fats. These are found in foods such as olive oil, canola oil, flaxseeds, walnuts, almonds, and seeds.  Eat more omega-3 fats. Good choices include salmon, mackerel, sardines, tuna, flaxseed oil, and ground flaxseeds.  Limit saturated fats. These are in animal products such as meats, butter, and cream. They can also be in plant products such as palm oil, palm kernel oil, and coconut oil.  Avoid foods with partially hydrogenated oils in them. These contain trans fats. Examples of foods that have trans fats are stick margarine, some tub margarines, cookies, crackers, and other baked goods. What general guidelines do I need to follow?  Check food labels. Look for the words "trans fat" and "saturated fat."  When preparing a meal: ? Fill half of your plate with vegetables and green salads. ? Fill one fourth of your plate with whole  grains. Look for the word "whole" as the first word in the ingredient list. ? Fill one fourth of your plate with lean protein foods.  Eat more foods that have fiber, like apples, carrots, beans, peas, and barley.  Eat more home-cooked foods. Eat less at restaurants and buffets.  Limit or avoid alcohol.  Limit foods high in starch and sugar.  Limit fried foods.  Cook foods without frying them. Baking, boiling, grilling, and broiling are all great options.  Lose weight if you are overweight. Losing even a small amount of weight can help your overall health. It can also help prevent diseases such as diabetes and heart disease. What foods can I eat? Grains Whole grains, such as whole wheat or whole grain breads, crackers, cereals, and pasta. Unsweetened oatmeal, bulgur, barley, quinoa, or brown rice. Corn or whole wheat flour tortillas. Vegetables Fresh or frozen vegetables (raw, steamed, roasted, or grilled). Green salads. Fruits All fresh, canned (in natural juice), or frozen fruits. Meat and Other Protein Products Ground beef (85% or leaner), grass-fed beef, or beef trimmed of fat. Skinless chicken or turkey. Ground chicken or turkey. Pork trimmed of fat. All fish and seafood. Eggs. Dried beans, peas, or lentils. Unsalted nuts or seeds. Unsalted canned or dry beans. Dairy Low-fat dairy products, such as skim or 1% milk, 2% or reduced-fat cheeses, low-fat ricotta or cottage cheese, or plain low-fat yogurt. Fats and Oils Tub margarines without trans fats. Light or reduced-fat mayonnaise and salad dressings. Avocado. Olive, canola, sesame, or safflower oils. Natural peanut or almond butter (choose ones without   added sugar and oil). The items listed above may not be a complete list of recommended foods or beverages. Contact your dietitian for more options. What foods are not recommended? Grains White bread. White pasta. White rice. Cornbread. Bagels, pastries, and croissants. Crackers that  contain trans fat. Vegetables White potatoes. Corn. Creamed or fried vegetables. Vegetables in a cheese sauce. Fruits Dried fruits. Canned fruit in light or heavy syrup. Fruit juice. Meat and Other Protein Products Fatty cuts of meat. Ribs, chicken wings, bacon, sausage, bologna, salami, chitterlings, fatback, hot dogs, bratwurst, and packaged luncheon meats. Liver and organ meats. Dairy Whole or 2% milk, cream, half-and-half, and cream cheese. Whole milk cheeses. Whole-fat or sweetened yogurt. Full-fat cheeses. Nondairy creamers and whipped toppings. Processed cheese, cheese spreads, or cheese curds. Sweets and Desserts Corn syrup, sugars, honey, and molasses. Candy. Jam and jelly. Syrup. Sweetened cereals. Cookies, pies, cakes, donuts, muffins, and ice cream. Fats and Oils Butter, stick margarine, lard, shortening, ghee, or bacon fat. Coconut, palm kernel, or palm oils. Beverages Alcohol. Sweetened drinks (such as sodas, lemonade, and fruit drinks or punches). The items listed above may not be a complete list of foods and beverages to avoid. Contact your dietitian for more information. This information is not intended to replace advice given to you by your health care provider. Make sure you discuss any questions you have with your health care provider. Document Released: 04/07/2012 Document Revised: 06/13/2016 Document Reviewed: 01/06/2014 Elsevier Interactive Patient Education  2018 Elsevier Inc.  

## 2017-08-07 LAB — CBC WITH DIFFERENTIAL/PLATELET
BASOS ABS: 0 10*3/uL (ref 0.0–0.2)
Basos: 0 %
EOS (ABSOLUTE): 0.2 10*3/uL (ref 0.0–0.4)
Eos: 3 %
Hematocrit: 40.8 % (ref 34.0–46.6)
Hemoglobin: 13.5 g/dL (ref 11.1–15.9)
Immature Grans (Abs): 0 10*3/uL (ref 0.0–0.1)
Immature Granulocytes: 0 %
LYMPHS ABS: 1.8 10*3/uL (ref 0.7–3.1)
Lymphs: 33 %
MCH: 29.3 pg (ref 26.6–33.0)
MCHC: 33.1 g/dL (ref 31.5–35.7)
MCV: 89 fL (ref 79–97)
Monocytes Absolute: 0.4 10*3/uL (ref 0.1–0.9)
Monocytes: 8 %
NEUTROS ABS: 3.1 10*3/uL (ref 1.4–7.0)
Neutrophils: 56 %
PLATELETS: 310 10*3/uL (ref 150–379)
RBC: 4.61 x10E6/uL (ref 3.77–5.28)
RDW: 13.7 % (ref 12.3–15.4)
WBC: 5.6 10*3/uL (ref 3.4–10.8)

## 2017-08-07 LAB — COMPREHENSIVE METABOLIC PANEL
ALBUMIN: 4.4 g/dL (ref 3.5–5.5)
ALK PHOS: 79 IU/L (ref 39–117)
ALT: 15 IU/L (ref 0–32)
AST: 21 IU/L (ref 0–40)
Albumin/Globulin Ratio: 1.4 (ref 1.2–2.2)
BILIRUBIN TOTAL: 0.5 mg/dL (ref 0.0–1.2)
BUN / CREAT RATIO: 14 (ref 9–23)
BUN: 13 mg/dL (ref 6–24)
CO2: 23 mmol/L (ref 20–29)
CREATININE: 0.96 mg/dL (ref 0.57–1.00)
Calcium: 10.2 mg/dL (ref 8.7–10.2)
Chloride: 101 mmol/L (ref 96–106)
GFR, EST AFRICAN AMERICAN: 80 mL/min/{1.73_m2} (ref >59)
GFR, EST NON AFRICAN AMERICAN: 69 mL/min/{1.73_m2} (ref >59)
GLUCOSE: 96 mg/dL (ref 65–99)
Globulin, Total: 3.1 g/dL (ref 1.5–4.5)
Potassium: 4.2 mmol/L (ref 3.5–5.2)
Sodium: 141 mmol/L (ref 134–144)
TOTAL PROTEIN: 7.5 g/dL (ref 6.0–8.5)

## 2017-08-07 LAB — LIPID PANEL
CHOL/HDL RATIO: 3.7 ratio (ref 0.0–4.4)
Cholesterol, Total: 207 mg/dL — ABNORMAL HIGH (ref 100–199)
HDL: 56 mg/dL (ref >39)
LDL Calculated: 136 mg/dL — ABNORMAL HIGH (ref 0–99)
Triglycerides: 73 mg/dL (ref 0–149)
VLDL Cholesterol Cal: 15 mg/dL (ref 5–40)

## 2017-08-07 LAB — HEMOGLOBIN A1C
Est. average glucose Bld gHb Est-mCnc: 123 mg/dL
HEMOGLOBIN A1C: 5.9 % — AB (ref 4.8–5.6)

## 2017-08-08 ENCOUNTER — Encounter: Payer: Self-pay | Admitting: Family Medicine

## 2017-09-19 ENCOUNTER — Other Ambulatory Visit: Payer: Self-pay | Admitting: Family Medicine

## 2017-09-19 NOTE — Telephone Encounter (Signed)
Does pt need office visit before prescription can be efilled

## 2017-09-22 NOTE — Telephone Encounter (Signed)
Please advise 

## 2017-09-23 NOTE — Telephone Encounter (Signed)
Copied from [place]. Topic: General - Other >> Sep 23, 2017  9:10 AM Darl Householder, RMA wrote: Reason for CRM: pharmacy is requesting a medication refill for Lexapro 20 mg to be sent to [place]

## 2018-01-12 ENCOUNTER — Encounter: Payer: Self-pay | Admitting: Physician Assistant

## 2018-01-12 ENCOUNTER — Other Ambulatory Visit: Payer: Self-pay

## 2018-01-12 ENCOUNTER — Ambulatory Visit: Payer: BLUE CROSS/BLUE SHIELD | Admitting: Physician Assistant

## 2018-01-12 VITALS — BP 127/85 | HR 65 | Temp 98.4°F | Resp 16 | Ht 61.24 in | Wt 175.0 lb

## 2018-01-12 DIAGNOSIS — G43009 Migraine without aura, not intractable, without status migrainosus: Secondary | ICD-10-CM

## 2018-01-12 MED ORDER — TIZANIDINE HCL 4 MG PO TABS: 4.0000 mg | ORAL_TABLET | Freq: Four times a day (QID) | ORAL | 0 refills | Status: DC | PRN

## 2018-01-12 MED ORDER — IBUPROFEN 800 MG PO TABS: 800.0000 mg | ORAL_TABLET | Freq: Three times a day (TID) | ORAL | 0 refills | Status: DC | PRN

## 2018-01-12 MED ORDER — KETOROLAC TROMETHAMINE 60 MG/2ML IM SOLN: 60.0000 mg | Freq: Once | INTRAMUSCULAR | Status: AC

## 2018-01-12 MED ORDER — OMEPRAZOLE 20 MG PO CPDR
20.0000 mg | DELAYED_RELEASE_CAPSULE | Freq: Every day | ORAL | 0 refills | Status: DC
Start: 2018-01-12 — End: 2018-02-09

## 2018-01-12 NOTE — Patient Instructions (Addendum)
I would like you to start taking the zanaflex. Please stop the vimovo for this week.  Take ibuprofen 800mg  every 8 hours as needed for the head pain.  You can take this with food.  I would like you to also take omeprazole with the medicine.  This is over the counter as well.   After you are done taking this for 1 week.  You can resume the vimovo.  vimovo has naproxen so we will avoid this while we take the ibuprofen. Make sure you are hydrating well with 64 oz of water if not more.  Eat clean fruits and vegetables.     Migraine Headache A migraine headache is a very strong throbbing pain on one side or both sides of your head. Migraines can also cause other symptoms. Talk with your doctor about what things may bring on (trigger) your migraine headaches. Follow these instructions at home: Medicines  Take over-the-counter and prescription medicines only as told by your doctor.  Do not drive or use heavy machinery while taking prescription pain medicine.  To prevent or treat constipation while you are taking prescription pain medicine, your doctor may recommend that you: ? Drink enough fluid to keep your pee (urine) clear or pale yellow. ? Take over-the-counter or prescription medicines. ? Eat foods that are high in fiber. These include fresh fruits and vegetables, whole grains, and beans. ? Limit foods that are high in fat and processed sugars. These include fried and sweet foods. Lifestyle  Avoid alcohol.  Do not use any products that contain nicotine or tobacco, such as cigarettes and e-cigarettes. If you need help quitting, ask your doctor.  Get at least 8 hours of sleep every night.  Limit your stress. General instructions   Keep a journal to find out what may bring on your migraines. For example, write down: ? What you eat and drink. ? How much sleep you get. ? Any change in what you eat or drink. ? Any change in your medicines.  If you have a migraine: ? Avoid things that  make your symptoms worse, such as bright lights. ? It may help to lie down in a dark, quiet room. ? Do not drive or use heavy machinery. ? Ask your doctor what activities are safe for you.  Keep all follow-up visits as told by your doctor. This is important. Contact a doctor if:  You get a migraine that is different or worse than your usual migraines. Get help right away if:  Your migraine gets very bad.  You have a fever.  You have a stiff neck.  You have trouble seeing.  Your muscles feel weak or like you cannot control them.  You start to lose your balance a lot.  You start to have trouble walking.  You pass out (faint). This information is not intended to replace advice given to you by your health care provider. Make sure you discuss any questions you have with your health care provider. Document Released: 07/16/2008 Document Revised: 04/26/2016 Document Reviewed: 03/25/2016 Elsevier Interactive Patient Education  2018 [employer].    IF you received an x-ray today, you will receive an invoice from [facility]. Please contact [facility] at [phone] with questions or concerns regarding your invoice.   IF you received labwork today, you will receive an invoice from [facility]. Please contact [facility] at [phone] with questions or concerns regarding your invoice.   Our billing staff will not be able to assist you with questions regarding  bills from these companies.  You will be contacted with the lab results as soon as they are available. The fastest way to get your results is to activate your My Chart account. Instructions are located on the last page of this paperwork. If you have not heard from [place] regarding the results in 2 weeks, please contact this office.

## 2018-01-12 NOTE — Progress Notes (Signed)
PRIMARY CARE AT [place], [place] [address] [phone]  Date:  01/12/2018   Name:  Lauren Paul   DOB:  01-23-1966   MRN:  [id number]  PCP:  Wardell Honour, MD    History of Present Illness:  Lauren Paul is a 52 y.o. female patient who presents to PCP with  Chief Complaint  Patient presents with  . bad migraines    since 01/08/18 , sensitivity to light/sound, nausea and vomiting.  Taking otc BC powders-helps with ha for a few hours but ha comes back.     5 days ago, she developed the head pain all over.  Character described as pounding.  The pain is rated 5/10 today, but initially, 8/10.  Nausea and vomiting with symptoms.  Woke up with the headache.  This is at the base of her head at this time, but initiated in a band like radius.   only thing to help is bc powder which relieves for several hours, but then resurfaces.  Aggravated with moving head to quickly. No nasal congestion, runny nose, sore throat , or coughing.    Photophobia, and phonophobia.  She has not hit her head pain.  She used to have migraines (cluster headaches) prescribed by neurologist.   She reports no extremity weakness or facial numbness or tingling.   She had no sleep for the last 24 hours prior to the start of her symptoms.  She works 2 jobs.   She missed her blood pressure that day.    Patient Active Problem List   Diagnosis Date Noted  . Glucose intolerance 08/06/2017  . Pure hypercholesterolemia 08/06/2017  . 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 Multiple drug allergies 07/21/2017  . Anaphylactic shock due to adverse food reaction 07/21/2017  . Mild intermittent asthma without complication 22/11/5425  . Other allergic rhinitis 07/21/2017  . Spinal stenosis of lumbar region with neurogenic claudication 04/20/2017  . Obesity 08/27/2016  . OSA (obstructive sleep apnea) 08/12/2016  . Glucose intolerance (impaired  glucose tolerance) 08/06/2016  . Menopausal hot flushes 08/06/2016  . Essential hypertension, benign 06/19/2014    Past Medical History:  Diagnosis Date  . Allergy   . Anemia   . Arthritis   . Asthma   . Headache    migraines  . Hypersomnolence 06/19/2014  . Hypertension   . Meningitis   . Rectal fissure   . Sepsis ([place])   . Ulcer     Past Surgical History:  Procedure Laterality Date  . ABLATION    . DILATION AND CURETTAGE OF UTERUS    . TUBAL LIGATION      Social History   Tobacco Use  . Smoking status: Never Smoker  . Smokeless tobacco: Never Used  Substance Use Topics  . Alcohol use: Yes    Alcohol/week: 1.2 oz    Types: 2 Standard drinks or equivalent per week    Comment: 2-3 glasses of wkly  . Drug use: No    Family History  Problem Relation Age of Onset  . Bladder Cancer Mother 77  . Uterine cancer Mother   . Cancer Mother 39       Uterine and bladder cancer  . Hypertension Mother   . Multiple sclerosis Mother   . Lung cancer Father   . Cancer Father 59       lung cancer  . Hypertension Sister   . Allergic rhinitis Neg Hx   .  Angioedema Neg Hx   . Asthma Neg Hx   . Eczema Neg Hx   . Immunodeficiency Neg Hx   . Urticaria Neg Hx     Allergies  Allergen Reactions  . Benadryl [Diphenhydramine Hcl] Shortness Of Breath    Pt states she can take Childrens Benadryl dye free fine.  . Diphenhydramine Hcl Anaphylaxis and Swelling  . Doxycycline Swelling  . Latex Swelling  . Penicillins Anaphylaxis    Pt states she can not take anything in the "cillin" family.  Marland Kitchen Apap-Pamabrom-Pyrilamine   . Iodides   . Losartan Other (See Comments)    LEG CRAMPS  . Olmesartan     Leg cramp   . Other     Watermelon- Anaphylaxis Nuts Melon  . Shellfish Allergy   . Sulfa Antibiotics   . Valsartan     Fatigue and alopecia    Medication list has been reviewed and updated.  Current Outpatient Medications on File Prior to Visit  Medication Sig Dispense  Refill  . albuterol (PROAIR HFA) 108 (90 Base) MCG/ACT inhaler Inhale 2 puffs into the lungs every 4 (four) hours as needed for wheezing or shortness of breath. 1 Inhaler 2  . EPINEPHrine (EPIPEN 2-PAK) 0.3 mg/0.3 mL IJ SOAJ injection Use as directed for severe allergic reaction 2 Device 2  . EPINEPHrine (EPIPEN 2-PAK) 0.3 mg/0.3 mL IJ SOAJ injection Use as directed for severe allergic reaction. 2 Device 1  . escitalopram (LEXAPRO) 20 MG tablet TAKE 1 TABLET BY MOUTH EVERY DAY 90 tablet 1  . gabapentin (NEURONTIN) 300 MG capsule TAKE 1 CAPSULE BY MOUTH THREE TIMES A DAY  0  . hydrochlorothiazide (HYDRODIURIL) 12.5 MG tablet TAKE 1 TABLET (12.5 MG TOTAL) BY MOUTH DAILY. 90 tablet 1  . metoprolol succinate (TOPROL-XL) 25 MG 24 hr tablet Take 1 tablet (25 mg total) by mouth daily. 90 tablet 1  . Multiple Vitamin (MULTIVITAMIN) capsule Take 1 capsule by mouth daily.    . traZODone (DESYREL) 50 MG tablet Take 1-2 tablets (50-100 mg total) by mouth at bedtime as needed. for sleep 90 tablet 3  . VIMOVO 500-20 MG TBEC TAKE ONE TABLET BY MOUTH TWICE DAILY 30 MINUTES BEFORE MEALS  1   No current facility-administered medications on file prior to visit.     ROS ROS otherwise unremarkable unless listed above.  Physical Examination: BP 127/85 (BP Location: Right Arm, Patient Position: Sitting, Cuff Size: Normal)   Pulse 65   Temp 98.4 F (36.9 C) (Oral)   Resp 16   Ht 5' 1.24" (1.555 m)   Wt 175 lb (79.4 kg)   SpO2 98%   BMI 32.81 kg/m  Ideal Body Weight: Weight in (lb) to have BMI = 25: 133.1  Physical Exam  Constitutional: She is oriented to person, place, and time. She appears well-developed and well-nourished. No distress.  HENT:  Head: Normocephalic and atraumatic.  Right Ear: External ear normal.  Left Ear: External ear normal.  Eyes: Pupils are equal, round, and reactive to light. Conjunctivae and EOM are normal.  Cardiovascular: Normal rate.  Pulmonary/Chest: Effort normal. No  respiratory distress.  Neurological: She is alert and oriented to person, place, and time. No cranial nerve deficit. She displays a negative Romberg sign. Coordination and gait normal.  Skin: She is not diaphoretic.  Psychiatric: She has a normal mood and affect. Her behavior is normal.     Assessment and Plan: Leira Regino is a 52 y.o. female who is here today for cc  of  Chief Complaint  Patient presents with  . bad migraines    since 01/08/18 , sensitivity to light/sound, nausea and vomiting.  Taking otc BC powders-helps with ha for a few hours but ha comes back.  given Toradol.  Advised ibuprofen with omeprazole at this time for her headache pain.  She will take tizanidine muscle relaxer.  May be advantageous to start Imitrex or verapamil, if she continues to have symptoms.  She will follow up with her PCP. Migraine without aura and without status migrainosus, not intractable - Plan: CBC, Basic metabolic panel, ketorolac (TORADOL) injection 60 mg, tiZANidine (ZANAFLEX) 4 MG tablet, omeprazole (PRILOSEC) 20 MG capsule, DISCONTINUED: ibuprofen (ADVIL,MOTRIN) 800 MG tablet  Ivar Drape, PA-C [facility] 3/26/20197:28 PM

## 2018-01-13 LAB — BASIC METABOLIC PANEL
BUN / CREAT RATIO: 17 (ref 9–23)
BUN: 14 mg/dL (ref 6–24)
CO2: 21 mmol/L (ref 20–29)
Calcium: 10.1 mg/dL (ref 8.7–10.2)
Chloride: 100 mmol/L (ref 96–106)
Creatinine, Ser: 0.81 mg/dL (ref 0.57–1.00)
GFR calc Af Amer: 98 mL/min/{1.73_m2} (ref >59)
GFR calc non Af Amer: 85 mL/min/{1.73_m2} (ref >59)
GLUCOSE: 80 mg/dL (ref 65–99)
POTASSIUM: 3.8 mmol/L (ref 3.5–5.2)
SODIUM: 139 mmol/L (ref 134–144)

## 2018-01-13 LAB — CBC
Hematocrit: 40.9 % (ref 34.0–46.6)
Hemoglobin: 13.5 g/dL (ref 11.1–15.9)
MCH: 28.6 pg (ref 26.6–33.0)
MCHC: 33 g/dL (ref 31.5–35.7)
MCV: 87 fL (ref 79–97)
Platelets: 297 10*3/uL (ref 150–379)
RBC: 4.72 x10E6/uL (ref 3.77–5.28)
RDW: 13 % (ref 12.3–15.4)
WBC: 6.9 10*3/uL (ref 3.4–10.8)

## 2018-01-27 ENCOUNTER — Other Ambulatory Visit: Payer: Self-pay | Admitting: Family Medicine

## 2018-01-27 NOTE — Telephone Encounter (Signed)
hydrochlorothiazide refill Last OV: 08/06/17 Last Refill:08/06/17 #90 tab 1 RF  Pharmacy:[facility] [id number]  PCP: Dr Reginia [facility] 01/12/18

## 2018-01-28 ENCOUNTER — Encounter: Payer: Self-pay | Admitting: Physician Assistant

## 2018-02-02 ENCOUNTER — Encounter: Payer: Managed Care, Other (non HMO) | Admitting: Family Medicine

## 2018-02-09 ENCOUNTER — Encounter: Payer: Self-pay | Admitting: Family Medicine

## 2018-02-09 ENCOUNTER — Other Ambulatory Visit: Payer: Self-pay

## 2018-02-09 ENCOUNTER — Ambulatory Visit (INDEPENDENT_AMBULATORY_CARE_PROVIDER_SITE_OTHER): Payer: BLUE CROSS/BLUE SHIELD | Admitting: Family Medicine

## 2018-02-09 VITALS — BP 138/82 | HR 76 | Temp 98.0°F | Resp 16 | Wt 174.0 lb

## 2018-02-09 DIAGNOSIS — G4733 Obstructive sleep apnea (adult) (pediatric): Secondary | ICD-10-CM

## 2018-02-09 DIAGNOSIS — J4520 Mild intermittent asthma, uncomplicated: Secondary | ICD-10-CM

## 2018-02-09 DIAGNOSIS — Z0000 Encounter for general adult medical examination without abnormal findings: Secondary | ICD-10-CM

## 2018-02-09 DIAGNOSIS — E7800 Pure hypercholesterolemia, unspecified: Secondary | ICD-10-CM

## 2018-02-09 DIAGNOSIS — I10 Essential (primary) hypertension: Secondary | ICD-10-CM

## 2018-02-09 DIAGNOSIS — E6609 Other obesity due to excess calories: Secondary | ICD-10-CM

## 2018-02-09 DIAGNOSIS — R7302 Impaired glucose tolerance (oral): Secondary | ICD-10-CM

## 2018-02-09 DIAGNOSIS — E66811 Obesity, class 1: Secondary | ICD-10-CM

## 2018-02-09 DIAGNOSIS — Z6832 Body mass index (BMI) 32.0-32.9, adult: Secondary | ICD-10-CM

## 2018-02-09 DIAGNOSIS — M48062 Spinal stenosis, lumbar region with neurogenic claudication: Secondary | ICD-10-CM

## 2018-02-09 LAB — POCT URINALYSIS DIP (MANUAL ENTRY)
BILIRUBIN UA: NEGATIVE
BILIRUBIN UA: NEGATIVE mg/dL
Glucose, UA: NEGATIVE mg/dL
Leukocytes, UA: NEGATIVE
Nitrite, UA: NEGATIVE
PH UA: 5.5 (ref 5.0–8.0)
Protein Ur, POC: NEGATIVE mg/dL
RBC UA: NEGATIVE
Spec Grav, UA: >=1.03 — AB (ref 1.010–1.025)
Urobilinogen, UA: 0.2 E.U./dL

## 2018-02-09 MED ORDER — TRAZODONE HCL 50 MG PO TABS: 50.0000 mg | ORAL_TABLET | Freq: Every evening | ORAL | 3 refills | Status: DC | PRN

## 2018-02-09 MED ORDER — ESCITALOPRAM OXALATE 20 MG PO TABS: 20.0000 mg | ORAL_TABLET | Freq: Every day | ORAL | 1 refills | Status: DC

## 2018-02-09 MED ORDER — METOPROLOL SUCCINATE ER 25 MG PO TB24: 25.0000 mg | ORAL_TABLET | Freq: Every day | ORAL | 1 refills | Status: DC

## 2018-02-09 NOTE — Patient Instructions (Signed)
     IF you received an x-ray today, you will receive an invoice from [facility]. Please contact [facility] at [phone] with questions or concerns regarding your invoice.   IF you received labwork today, you will receive an invoice from [facility]. Please contact [facility] at [phone] with questions or concerns regarding your invoice.   Our billing staff will not be able to assist you with questions regarding bills from these companies.  You will be contacted with the lab results as soon as they are available. The fastest way to get your results is to activate your My Chart account. Instructions are located on the last page of this paperwork. If you have not heard from us regarding the results in 2 weeks, please contact this office.     

## 2018-02-09 NOTE — Progress Notes (Signed)
Subjective:    Patient ID: Lauren Paul, female    DOB: [birth year], 52 y.o.   MRN: [id number]  02/09/2018  Annual Exam    HPI This 52 y.o. female presents for COMPLETE PHYSICAL EXAMINATION.  Cecille Rubin [facility]yn[facility] Refusing colonoscopy at this time.   Visual Acuity Screening   Right eye Left eye Both eyes  Without correction: 20/20 20/20 20/20   With correction:      Health Maintenance  Topic Date Due  . PAP SMEAR  10/21/2016  . COLONOSCOPY  07/02/2017  . INFLUENZA VACCINE  05/21/2018  . MAMMOGRAM  06/09/2018  . TETANUS/TDAP  05/11/2024  . HIV Screening  Completed    BP Readings from Last 3 Encounters:  02/28/18 112/70  02/09/18 138/82  01/12/18 127/85   Wt Readings from Last 3 Encounters:  02/28/18 174 lb (78.9 kg)  02/09/18 174 lb (78.9 kg)  01/12/18 175 lb (79.4 kg)   Immunization History  Administered Date(s) Administered  . Tdap 05/11/2014   Health Maintenance  Topic Date Due  . PAP SMEAR  10/21/2016  . COLONOSCOPY  07/02/2017  . INFLUENZA VACCINE  05/21/2018  . MAMMOGRAM  06/09/2018  . TETANUS/TDAP  05/11/2024  . HIV Screening  Completed   Review of Systems  Constitutional: Negative for activity change, appetite change, chills, diaphoresis, fatigue, fever and unexpected weight change.  HENT: Negative for congestion, dental problem, drooling, ear discharge, ear pain, facial swelling, hearing loss, mouth sores, nosebleeds, postnasal drip, rhinorrhea, sinus pressure, sneezing, sore throat, tinnitus, trouble swallowing and voice change.   Eyes: Negative for photophobia, pain, discharge, redness, itching and visual disturbance.  Respiratory: Negative for apnea, cough, choking, chest tightness, shortness of breath, wheezing and stridor.   Cardiovascular: Negative for chest pain, palpitations and leg swelling.  Gastrointestinal: Negative for abdominal distention, abdominal pain, anal bleeding, blood in stool, constipation, diarrhea, nausea, rectal pain  and vomiting.  Endocrine: Negative for cold intolerance, heat intolerance, polydipsia, polyphagia and polyuria.  Genitourinary: Negative for decreased urine volume, difficulty urinating, dyspareunia, dysuria, enuresis, flank pain, frequency, genital sores, hematuria, menstrual problem, pelvic pain, urgency, vaginal bleeding, vaginal discharge and vaginal pain.  Musculoskeletal: Negative for arthralgias, back pain, gait problem, joint swelling, myalgias, neck pain and neck stiffness.  Skin: Negative for color change, pallor, rash and wound.  Allergic/Immunologic: Negative for environmental allergies, food allergies and immunocompromised state.  Neurological: Negative for dizziness, tremors, seizures, syncope, facial asymmetry, speech difficulty, weakness, light-headedness, numbness and headaches.  Hematological: Negative for adenopathy. Does not bruise/bleed easily.  Psychiatric/Behavioral: Negative for agitation, behavioral problems, confusion, decreased concentration, dysphoric mood, hallucinations, self-injury, sleep disturbance and suicidal ideas. The patient is not nervous/anxious and is not hyperactive.        Bedtime 6:00; wakes up 600.    Past Medical History:  Diagnosis Date  . Allergy   . Anemia   . Arthritis   . Asthma   . Headache    migraines  . Hypersomnolence 06/19/2014  . Hypertension   . Meningitis   . Rectal fissure   . Sepsis ([place])   . Ulcer    Past Surgical History:  Procedure Laterality Date  . ABLATION    . DILATION AND CURETTAGE OF UTERUS    . TUBAL LIGATION     Allergies  Allergen Reactions  . Benadryl [Diphenhydramine Hcl] Shortness Of Breath    Pt states she can take Childrens Benadryl dye free fine.  . Diphenhydramine Hcl Anaphylaxis and Swelling  . Doxycycline Swelling  . Latex  Swelling  . Penicillins Anaphylaxis    Pt states she can not take anything in the "cillin" family.  Marland Kitchen Apap-Pamabrom-Pyrilamine   . Iodides   . Losartan Other (See Comments)     LEG CRAMPS  . Olmesartan     Leg cramp   . Other     Watermelon- Anaphylaxis Nuts Melon  . Shellfish Allergy   . Sulfa Antibiotics   . Valsartan     Fatigue and alopecia   Current Outpatient Medications on File Prior to Visit  Medication Sig Dispense Refill  . EPINEPHrine (EPIPEN 2-PAK) 0.3 mg/0.3 mL IJ SOAJ injection Use as directed for severe allergic reaction 2 Device 2  . gabapentin (NEURONTIN) 300 MG capsule TAKE 1 CAPSULE BY MOUTH THREE TIMES A DAY  0  . hydrochlorothiazide (HYDRODIURIL) 12.5 MG tablet TAKE 1 TABLET BY MOUTH EVERY DAY 90 tablet 1  . Multiple Vitamin (MULTIVITAMIN) capsule Take 1 capsule by mouth daily.    Marland Kitchen VIMOVO 500-20 MG TBEC TAKE ONE TABLET BY MOUTH TWICE DAILY 30 MINUTES BEFORE MEALS  1   No current facility-administered medications on file prior to visit.    Social History   Socioeconomic History  . Marital status: Single    Spouse name: Not on file  . Number of children: 0  . Years of education: 74  . Highest education level: Not on file  Occupational History  . Occupation: CLERICAL    Comment: [employer]  Social Needs  . Financial resource strain: Not on file  . Food insecurity:    Worry: Not on file    Inability: Not on file  . Transportation needs:    Medical: Not on file    Non-medical: Not on file  Tobacco Use  . Smoking status: Never Smoker  . Smokeless tobacco: Never Used  Substance and Sexual Activity  . Alcohol use: Yes    Alcohol/week: 1.2 oz    Types: 2 Standard drinks or equivalent per week    Comment: 2-3 glasses of wkly  . Drug use: No  . Sexual activity: Yes    Birth control/protection: Surgical  Lifestyle  . Physical activity:    Days per week: Not on file    Minutes per session: Not on file  . Stress: Not on file  Relationships  . Social connections:    Talks on phone: Not on file    Gets together: Not on file    Attends religious service: Not on file    Active member of club or organization: Not on file      Attends meetings of clubs or organizations: Not on file    Relationship status: Not on file  . Intimate partner violence:    Fear of current or ex partner: Not on file    Emotionally abused: Not on file    Physically abused: Not on file    Forced sexual activity: Not on file  Other Topics Concern  . Not on file  Social History Narrative   Marital status:  Single; not dating.  Not interested in 2018.      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      Tobacco:  None      Alcohol:  Weekends; 5 glasses of wine weekly      Exercise:  Sporadic.  Caffeine: One cup of caffeine daily.         Family History  Problem Relation Age of Onset  . Bladder Cancer Mother 24  . Uterine cancer Mother   . Cancer Mother 82       Uterine and bladder cancer  . Hypertension Mother   . Multiple sclerosis Mother   . Lung cancer Father   . Cancer Father 1       lung cancer  . Hypertension Sister   . Allergic rhinitis Neg Hx   . Angioedema Neg Hx   . Asthma Neg Hx   . Eczema Neg Hx   . Immunodeficiency Neg Hx   . Urticaria Neg Hx        Objective:    BP 138/82   Pulse 76   Temp 98 F (36.7 C) (Oral)   Resp 16   Wt 174 lb (78.9 kg)   SpO2 99%   BMI 32.62 kg/m  Physical Exam  Constitutional: She is oriented to person, place, and time. She appears well-developed and well-nourished. No distress.  HENT:  Head: Normocephalic and atraumatic.  Right Ear: External ear normal.  Left Ear: External ear normal.  Nose: Nose normal.  Mouth/Throat: Oropharynx is clear and moist.  Eyes: Pupils are equal, round, and reactive to light. Conjunctivae and EOM are normal.  Neck: Normal range of motion and full passive range of motion without pain. Neck supple. No JVD present. Carotid bruit is not present. No thyromegaly present.  Cardiovascular: Normal rate, regular rhythm  and normal heart sounds. Exam reveals no gallop and no friction rub.  No murmur heard. Pulmonary/Chest: Effort normal and breath sounds normal. She has no wheezes. She has no rales.  Abdominal: Soft. Bowel sounds are normal. She exhibits no distension and no mass. There is no tenderness. There is no rebound and no guarding.  Musculoskeletal:       Right shoulder: Normal.       Left shoulder: Normal.       Cervical back: Normal.  Lymphadenopathy:    She has no cervical adenopathy.  Neurological: She is alert and oriented to person, place, and time. She has normal reflexes. No cranial nerve deficit. She exhibits normal muscle tone. Coordination normal.  Skin: Skin is warm and dry. No rash noted. She is not diaphoretic. No erythema. No pallor.  Psychiatric: She has a normal mood and affect. Her behavior is normal. Judgment and thought content normal.  Nursing note and vitals reviewed.  No results found. Depression screen [facility] 2/9 02/28/2018 02/09/2018 02/09/2018 01/12/2018 08/06/2017  Decreased Interest 0 0 0 0 0  Down, Depressed, Hopeless 0 0 0 0 0  PHQ - 2 Score 0 0 0 0 0   Fall Risk  02/28/2018 02/09/2018 02/09/2018 01/12/2018 08/06/2017  Falls in the past year? No No No No No        Assessment & Plan:   1. Routine physical examination   2. Essential hypertension, benign   3. Mild intermittent asthma without complication   4. OSA (obstructive sleep apnea)   5. Glucose intolerance (impaired glucose tolerance)   6. Pure hypercholesterolemia   7. Spinal stenosis of lumbar region with neurogenic claudication   8. Class 1 obesity due to excess calories with serious comorbidity and body mass index (BMI) of 32.0 to 32.9 in adult     -anticipatory guidance provided --- exercise, weight loss, safe driving practices, aspirin 81mg  daily. -obtain age appropriate screening labs and labs for chronic  disease management. -refills of chronic medications provided without adjustments. -Obesity:  Recommend weight loss, exercise for 30-60 minutes five days per week; recommend 1200 kcal restriction per day with a minimum of 60 grams of protein per day.  Eat 3 meals per day. Do not skip meals. Consider having a protein shake as a meal replacement to aid with eliminating meal skipping. Look for products with <220 calories, <7 gm sugar, and 20-30 gm protein.  Eat breakfast within 2 hours of getting up.   Make  your plate non-starchy vegetables,  protein, and  carbohydrates at lunch and dinner.   Aim for at least 64 oz. of calorie-free beverages daily (water, Crystal Light, diet green tea, etc.). Eliminate any sugary beverages such as regular soda, sweet tea, or fruit juice.   Pay attention to hunger and fullness cues.  Stop eating once you feel satisfied; don't wait until you feel full, stuffed, or sick from eating.  Choose lean meats and low fat/fat free dairy products.  Choose foods high in fiber such as fruits, vegetables, and whole grains (brown rice, whole wheat pasta, whole wheat bread, etc.).  Limit foods with added sugar to <7 gm per serving.  Always eat in the kitchen/dining room.  Never eat in the bedroom or in front of the TV.        Orders Placed This Encounter  Procedures  . CBC with Differential/Platelet  . Comprehensive metabolic panel    Order Specific Question:   Has the patient fasted?    Answer:   No  . Hemoglobin A1c  . Lipid panel    Order Specific Question:   Has the patient fasted?    Answer:   No  . TSH  . POCT urinalysis dipstick  . EKG 12-Lead   Meds ordered this encounter  Medications  . escitalopram (LEXAPRO) 20 MG tablet    Sig: Take 1 tablet (20 mg total) by mouth daily.    Dispense:  90 tablet    Refill:  1  . metoprolol succinate (TOPROL-XL) 25 MG 24 hr tablet    Sig: Take 1 tablet (25 mg total) by mouth daily.    Dispense:  90 tablet    Refill:  1  . DISCONTD: traZODone (DESYREL) 50 MG tablet    Sig: Take 1-2 tablets (50-100 mg  total) by mouth at bedtime as needed. for sleep    Dispense:  90 tablet    Refill:  3    Return in about 6 months (around 08/11/2018) for follow-up chronic medical conditions STALLINGS.   Ijeoma Loor Elayne Guerin, M.D. Primary Care at [facility] [address] [place], [place]  [address] [phone] phone [phone] fax

## 2018-02-10 LAB — CBC WITH DIFFERENTIAL/PLATELET
BASOS: 0 %
Basophils Absolute: 0 10*3/uL (ref 0.0–0.2)
EOS (ABSOLUTE): 0.3 10*3/uL (ref 0.0–0.4)
Eos: 4 %
Hematocrit: 40.4 % (ref 34.0–46.6)
Hemoglobin: 13.6 g/dL (ref 11.1–15.9)
IMMATURE GRANULOCYTES: 0 %
Immature Grans (Abs): 0 10*3/uL (ref 0.0–0.1)
Lymphocytes Absolute: 2.4 10*3/uL (ref 0.7–3.1)
Lymphs: 36 %
MCH: 28.7 pg (ref 26.6–33.0)
MCHC: 33.7 g/dL (ref 31.5–35.7)
MCV: 85 fL (ref 79–97)
MONOS ABS: 0.3 10*3/uL (ref 0.1–0.9)
Monocytes: 5 %
NEUTROS PCT: 55 %
Neutrophils Absolute: 3.8 10*3/uL (ref 1.4–7.0)
Platelets: 249 10*3/uL (ref 150–379)
RBC: 4.74 x10E6/uL (ref 3.77–5.28)
RDW: 13.6 % (ref 12.3–15.4)
WBC: 6.9 10*3/uL (ref 3.4–10.8)

## 2018-02-10 LAB — COMPREHENSIVE METABOLIC PANEL
ALT: 12 IU/L (ref 0–32)
AST: 19 IU/L (ref 0–40)
Albumin/Globulin Ratio: 1.6 (ref 1.2–2.2)
Albumin: 4.5 g/dL (ref 3.5–5.5)
Alkaline Phosphatase: 92 IU/L (ref 39–117)
BUN/Creatinine Ratio: 22 (ref 9–23)
BUN: 17 mg/dL (ref 6–24)
Bilirubin Total: 0.3 mg/dL (ref 0.0–1.2)
CALCIUM: 9.7 mg/dL (ref 8.7–10.2)
CO2: 23 mmol/L (ref 20–29)
Chloride: 102 mmol/L (ref 96–106)
Creatinine, Ser: 0.77 mg/dL (ref 0.57–1.00)
GFR, EST AFRICAN AMERICAN: 104 mL/min/{1.73_m2} (ref >59)
GFR, EST NON AFRICAN AMERICAN: 90 mL/min/{1.73_m2} (ref >59)
GLOBULIN, TOTAL: 2.8 g/dL (ref 1.5–4.5)
Glucose: 87 mg/dL (ref 65–99)
POTASSIUM: 4.2 mmol/L (ref 3.5–5.2)
SODIUM: 139 mmol/L (ref 134–144)
TOTAL PROTEIN: 7.3 g/dL (ref 6.0–8.5)

## 2018-02-10 LAB — LIPID PANEL
Chol/HDL Ratio: 4.3 ratio (ref 0.0–4.4)
Cholesterol, Total: 201 mg/dL — ABNORMAL HIGH (ref 100–199)
HDL: 47 mg/dL (ref >39)
LDL CALC: 83 mg/dL (ref 0–99)
Triglycerides: 354 mg/dL — ABNORMAL HIGH (ref 0–149)
VLDL CHOLESTEROL CAL: 71 mg/dL — AB (ref 5–40)

## 2018-02-10 LAB — HEMOGLOBIN A1C
Est. average glucose Bld gHb Est-mCnc: 120 mg/dL
Hgb A1c MFr Bld: 5.8 % — ABNORMAL HIGH (ref 4.8–5.6)

## 2018-02-10 LAB — TSH: TSH: 0.904 u[IU]/mL (ref 0.450–4.500)

## 2018-02-28 ENCOUNTER — Ambulatory Visit (INDEPENDENT_AMBULATORY_CARE_PROVIDER_SITE_OTHER): Payer: BLUE CROSS/BLUE SHIELD | Admitting: Family Medicine

## 2018-02-28 ENCOUNTER — Encounter: Payer: Self-pay | Admitting: Family Medicine

## 2018-02-28 ENCOUNTER — Other Ambulatory Visit: Payer: Self-pay

## 2018-02-28 VITALS — BP 112/70 | HR 68 | Temp 98.7°F | Resp 16 | Wt 174.0 lb

## 2018-02-28 DIAGNOSIS — L918 Other hypertrophic disorders of the skin: Secondary | ICD-10-CM

## 2018-02-28 NOTE — Progress Notes (Signed)
Subjective:    Patient ID: Lauren Paul, female    DOB: 1966-04-02, 52 y.o.   MRN: [id number]  02/28/2018  Mole Removal (pt states she would like a mole on her left leg removed )    HPI This 52 y.o. female presents for evaluation of irritated skin tag on LEFT leg.  Enlarging; often gets caught on pants.     BP Readings from Last 3 Encounters:  02/28/18 112/70  02/09/18 138/82  01/12/18 127/85   Wt Readings from Last 3 Encounters:  02/28/18 174 lb (78.9 kg)  02/09/18 174 lb (78.9 kg)  01/12/18 175 lb (79.4 kg)   Immunization History  Administered Date(s) Administered  . Tdap 05/11/2014    Review of Systems  Constitutional: Negative for chills, diaphoresis, fatigue and fever.  Skin: Negative for color change, pallor, rash and wound.    Past Medical History:  Diagnosis Date  . Allergy   . Anemia   . Arthritis   . Asthma   . Headache    migraines  . Hypersomnolence 06/19/2014  . Hypertension   . Meningitis   . Rectal fissure   . Sepsis ([place])   . Ulcer    Past Surgical History:  Procedure Laterality Date  . ABLATION    . DILATION AND CURETTAGE OF UTERUS    . TUBAL LIGATION     Allergies  Allergen Reactions  . Benadryl [Diphenhydramine Hcl] Shortness Of Breath    Pt states she can take Childrens Benadryl dye free fine.  . Diphenhydramine Hcl Anaphylaxis and Swelling  . Doxycycline Swelling  . Latex Swelling  . Penicillins Anaphylaxis    Pt states she can not take anything in the "cillin" family.  Marland Kitchen Apap-Pamabrom-Pyrilamine   . Iodides   . Losartan Other (See Comments)    LEG CRAMPS  . Olmesartan     Leg cramp   . Other     Watermelon- Anaphylaxis Nuts Melon  . Shellfish Allergy   . Sulfa Antibiotics   . Valsartan     Fatigue and alopecia   Current Outpatient Medications on File Prior to Visit  Medication Sig Dispense Refill  . EPINEPHrine (EPIPEN 2-PAK) 0.3 mg/0.3 mL IJ SOAJ injection Use as directed for severe allergic reaction 2 Device 2    . gabapentin (NEURONTIN) 300 MG capsule TAKE 1 CAPSULE BY MOUTH THREE TIMES A DAY  0  . hydrochlorothiazide (HYDRODIURIL) 12.5 MG tablet TAKE 1 TABLET BY MOUTH EVERY DAY 90 tablet 1  . metoprolol succinate (TOPROL-XL) 25 MG 24 hr tablet Take 1 tablet (25 mg total) by mouth daily. 90 tablet 1  . Multiple Vitamin (MULTIVITAMIN) capsule Take 1 capsule by mouth daily.    Marland Kitchen VIMOVO 500-20 MG TBEC TAKE ONE TABLET BY MOUTH TWICE DAILY 30 MINUTES BEFORE MEALS  1   No current facility-administered medications on file prior to visit.    Social History   Socioeconomic History  . Marital status: Single    Spouse name: Not on file  . Number of children: 0  . Years of education: 32  . Highest education level: Not on file  Occupational History  . Occupation: CLERICAL    Comment: [employer]  Social Needs  . Financial resource strain: Not on file  . Food insecurity:    Worry: Not on file    Inability: Not on file  . Transportation needs:    Medical: Not on file    Non-medical: Not on file  Tobacco Use  . Smoking status:  Never Smoker  . Smokeless tobacco: Never Used  Substance and Sexual Activity  . Alcohol use: Yes    Alcohol/week: 1.2 oz    Types: 2 Standard drinks or equivalent per week    Comment: 2-3 glasses of wkly  . Drug use: No  . Sexual activity: Yes    Birth control/protection: Surgical  Lifestyle  . Physical activity:    Days per week: Not on file    Minutes per session: Not on file  . Stress: Not on file  Relationships  . Social connections:    Talks on phone: Not on file    Gets together: Not on file    Attends religious service: Not on file    Active member of club or organization: Not on file    Attends meetings of clubs or organizations: Not on file    Relationship status: Not on file  . Intimate partner violence:    Fear of current or ex partner: Not on file    Emotionally abused: Not on file    Physically abused: Not on file    Forced sexual activity: Not on  file  Other Topics Concern  . Not on file  Social History Narrative   Marital status:  Single; not dating.  Not interested in 2018.      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      Tobacco:  None      Alcohol:  Weekends; 5 glasses of wine weekly      Exercise:  Sporadic.       Caffeine: One cup of caffeine daily.         Family History  Problem Relation Age of Onset  . Bladder Cancer Mother 8  . Uterine cancer Mother   . Cancer Mother 13       Uterine and bladder cancer  . Hypertension Mother   . Multiple sclerosis Mother   . Lung cancer Father   . Cancer Father 52       lung cancer  . Hypertension Sister   . Allergic rhinitis Neg Hx   . Angioedema Neg Hx   . Asthma Neg Hx   . Eczema Neg Hx   . Immunodeficiency Neg Hx   . Urticaria Neg Hx        Objective:    BP 112/70   Pulse 68   Temp 98.7 F (37.1 C) (Oral)   Resp 16   Wt 174 lb (78.9 kg)   SpO2 98%   BMI 32.62 kg/m  Physical Exam  Constitutional: She is oriented to person, place, and time. She appears well-developed and well-nourished. No distress.  HENT:  Head: Normocephalic and atraumatic.  Eyes: Pupils are equal, round, and reactive to light. Conjunctivae are normal.  Neck: Normal range of motion. Neck supple.  Cardiovascular: Normal rate, regular rhythm and normal heart sounds. Exam reveals no gallop and no friction rub.  No murmur heard. Pulmonary/Chest: Effort normal and breath sounds normal. She has no wheezes. She has no rales.  Neurological: She is alert and oriented to person, place, and time.  Skin: She is not diaphoretic.  Large 1 cm diameter skin tag attached to left proximal thigh with erythema.  Psychiatric: She has a normal mood and affect. Her behavior is normal.  Nursing note and vitals  reviewed.  No results found. Depression screen [facility] 2/9 02/28/2018  02/09/2018 02/09/2018 01/12/2018 08/06/2017  Decreased Interest 0 0 0 0 0  Down, Depressed, Hopeless 0 0 0 0 0  PHQ - 2 Score 0 0 0 0 0   Fall Risk  02/28/2018 02/09/2018 02/09/2018 01/12/2018 08/06/2017  Falls in the past year? No No No No No    Procedure: verbal consent obtained; 2% lidocaine with epi 1 cc administered at base of skin tag; iris scissors excised lesion. Silver nitrate stick cautery applied with good hemostasis; skin tag sent for pathology due to large size.  Bandage applied.    Assessment & Plan:   1. Skin tag   2. Degenerative disc disease at L5-S1 level     Inflamed skin tag: New.  Located at left proximal thigh.  Status post resection in office.  Sent for pathology due to large size.  No orders of the defined types were placed in this encounter.  No orders of the defined types were placed in this encounter.   No follow-ups on file.   Kristi Elayne Guerin, M.D. Primary Care at [facility] previously [facility] [address] [place], [place]  [phone] phone [phone] fax

## 2018-02-28 NOTE — Patient Instructions (Addendum)
     IF you received an x-ray today, you will receive an invoice from [facility]. Please contact [facility] at [phone] with questions or concerns regarding your invoice.   IF you received labwork today, you will receive an invoice from [facility]. Please contact [facility] at [phone] with questions or concerns regarding your invoice.   Our billing staff will not be able to assist you with questions regarding bills from these companies.  You will be contacted with the lab results as soon as they are available. The fastest way to get your results is to activate your My Chart account. Instructions are located on the last page of this paperwork. If you have not heard from [place] regarding the results in 2 weeks, please contact this office.      Skin Tag, Adult A skin tag (acrochordon) is a soft, extra growth of skin. Most skin tags are flesh-colored and rarely bigger than a pencil eraser. They commonly form near areas where there are folds in the skin, such as the armpit or groin. Skin tags are not dangerous, and they do not spread from person to person (are not contagious). You may have one skin tag or several. Skin tags do not require treatment. However, your health care provider may recommend removal of a skin tag if it:  Gets irritated from clothing.  Bleeds.  Is visible and unsightly.  Your health care provider can remove skin tags with a simple surgical procedure or a procedure that involves freezing the skin tag. Follow these instructions at home:  Watch for any changes in your skin tag. A normal skin tag does not require any other special care at home.  Take over-the-counter and prescription medicines only as told by your health care provider.  Keep all follow-up visits as told by your health care provider. This is important. Contact a health care provider if:  You have a skin tag that: ? Becomes painful. ? Changes color. ? Bleeds. ? Swells.  You  develop more skin tags. This information is not intended to replace advice given to you by your health care provider. Make sure you discuss any questions you have with your health care provider. Document Released: 10/22/2015 Document Revised: 06/02/2016 Document Reviewed: 10/22/2015 Elsevier Interactive Patient Education  [employer].

## 2018-03-02 ENCOUNTER — Encounter: Payer: Self-pay | Admitting: Family Medicine

## 2018-03-12 ENCOUNTER — Encounter: Payer: Self-pay | Admitting: Family Medicine

## 2018-03-19 ENCOUNTER — Other Ambulatory Visit: Payer: Self-pay | Admitting: Family Medicine

## 2018-03-20 NOTE — Telephone Encounter (Signed)
Request for 90 day supply from pharmacy on Trazodone (requesting qty of 270 tabs)  Last refill on Trazodone; 02/09/18; # 76; RF x 3 Last office visit 02/09/18 Pharmacy: [facility] on [address] In [facility]  Please advise

## 2018-04-16 ENCOUNTER — Encounter: Payer: Self-pay | Admitting: Family Medicine

## 2018-04-24 ENCOUNTER — Other Ambulatory Visit: Payer: Self-pay | Admitting: Family Medicine

## 2018-05-04 ENCOUNTER — Encounter: Payer: Self-pay | Admitting: Family Medicine

## 2018-05-15 ENCOUNTER — Ambulatory Visit (INDEPENDENT_AMBULATORY_CARE_PROVIDER_SITE_OTHER): Payer: BLUE CROSS/BLUE SHIELD | Admitting: Family Medicine

## 2018-05-15 ENCOUNTER — Encounter: Payer: Self-pay | Admitting: Family Medicine

## 2018-05-15 ENCOUNTER — Other Ambulatory Visit: Payer: Self-pay

## 2018-05-15 VITALS — BP 140/80 | HR 68 | Temp 99.0°F | Resp 16 | Ht 60.0 in | Wt 170.0 lb

## 2018-05-15 DIAGNOSIS — E7800 Pure hypercholesterolemia, unspecified: Secondary | ICD-10-CM | POA: Diagnosis not present

## 2018-05-15 DIAGNOSIS — R7302 Impaired glucose tolerance (oral): Secondary | ICD-10-CM

## 2018-05-15 DIAGNOSIS — Z6833 Body mass index (BMI) 33.0-33.9, adult: Secondary | ICD-10-CM

## 2018-05-15 DIAGNOSIS — R0789 Other chest pain: Secondary | ICD-10-CM | POA: Diagnosis not present

## 2018-05-15 DIAGNOSIS — F430 Acute stress reaction: Secondary | ICD-10-CM

## 2018-05-15 DIAGNOSIS — E6609 Other obesity due to excess calories: Secondary | ICD-10-CM

## 2018-05-15 DIAGNOSIS — I10 Essential (primary) hypertension: Secondary | ICD-10-CM | POA: Diagnosis not present

## 2018-05-15 NOTE — Patient Instructions (Addendum)
   IF you received an x-ray today, you will receive an invoice from [facility]. Please contact [facility] at [phone] with questions or concerns regarding your invoice.   IF you received labwork today, you will receive an invoice from [facility]. Please contact [facility] at [phone] with questions or concerns regarding your invoice.   Our billing staff will not be able to assist you with questions regarding bills from these companies.  You will be contacted with the lab results as soon as they are available. The fastest way to get your results is to activate your My Chart account. Instructions are located on the last page of this paperwork. If you have not heard from us regarding the results in 2 weeks, please contact this office.      Managing Your Hypertension Hypertension is commonly called high blood pressure. This is when the force of your blood pressing against the walls of your arteries is too strong. Arteries are blood vessels that carry blood from your heart throughout your body. Hypertension forces the heart to work harder to pump blood, and may cause the arteries to become narrow or stiff. Having untreated or uncontrolled hypertension can cause heart attack, stroke, kidney disease, and other problems. What are blood pressure readings? A blood pressure reading consists of a higher number over a lower number. Ideally, your blood pressure should be below 120/80. The first ("top") number is called the systolic pressure. It is a measure of the pressure in your arteries as your heart beats. The second ("bottom") number is called the diastolic pressure. It is a measure of the pressure in your arteries as the heart relaxes. What does my blood pressure reading mean? Blood pressure is classified into four stages. Based on your blood pressure reading, your health care provider may use the following stages to determine what type of treatment you need, if any. Systolic  pressure and diastolic pressure are measured in a unit called mm Hg. Normal  Systolic pressure: below 120.  Diastolic pressure: below 80. Elevated  Systolic pressure: 120-129.  Diastolic pressure: below 80. Hypertension stage 1  Systolic pressure: 130-139.  Diastolic pressure: 80-89. Hypertension stage 2  Systolic pressure: 140 or above.  Diastolic pressure: 90 or above. What health risks are associated with hypertension? Managing your hypertension is an important responsibility. Uncontrolled hypertension can lead to:  A heart attack.  A stroke.  A weakened blood vessel (aneurysm).  Heart failure.  Kidney damage.  Eye damage.  Metabolic syndrome.  Memory and concentration problems.  What changes can I make to manage my hypertension? Hypertension can be managed by making lifestyle changes and possibly by taking medicines. Your health care provider will help you make a plan to bring your blood pressure within a normal range. Eating and drinking  Eat a diet that is high in fiber and potassium, and low in salt (sodium), added sugar, and fat. An example eating plan is called the DASH (Dietary Approaches to Stop Hypertension) diet. To eat this way: ? Eat plenty of fresh fruits and vegetables. Try to fill half of your plate at each meal with fruits and vegetables. ? Eat whole grains, such as whole wheat pasta, brown rice, or whole grain bread. Fill about one quarter of your plate with whole grains. ? Eat low-fat diary products. ? Avoid fatty cuts of meat, processed or cured meats, and poultry with skin. Fill about one quarter of your plate with lean proteins such as fish, chicken without skin, beans, eggs,   and tofu. ? Avoid premade and processed foods. These tend to be higher in sodium, added sugar, and fat.  Reduce your daily sodium intake. Most people with hypertension should eat less than 1,500 mg of sodium a day.  Limit alcohol intake to no more than 1 drink a day  for nonpregnant women and 2 drinks a day for men. One drink equals 12 oz of beer, 5 oz of wine, or 1 oz of hard liquor. Lifestyle  Work with your health care provider to maintain a healthy body weight, or to lose weight. Ask what an ideal weight is for you.  Get at least 30 minutes of exercise that causes your heart to beat faster (aerobic exercise) most days of the week. Activities may include walking, swimming, or biking.  Include exercise to strengthen your muscles (resistance exercise), such as weight lifting, as part of your weekly exercise routine. Try to do these types of exercises for 30 minutes at least 3 days a week.  Do not use any products that contain nicotine or tobacco, such as cigarettes and e-cigarettes. If you need help quitting, ask your health care provider.  Control any long-term (chronic) conditions you have, such as high cholesterol or diabetes. Monitoring  Monitor your blood pressure at home as told by your health care provider. Your personal target blood pressure may vary depending on your medical conditions, your age, and other factors.  Have your blood pressure checked regularly, as often as told by your health care provider. Working with your health care provider  Review all the medicines you take with your health care provider because there may be side effects or interactions.  Talk with your health care provider about your diet, exercise habits, and other lifestyle factors that may be contributing to hypertension.  Visit your health care provider regularly. Your health care provider can help you create and adjust your plan for managing hypertension. Will I need medicine to control my blood pressure? Your health care provider may prescribe medicine if lifestyle changes are not enough to get your blood pressure under control, and if:  Your systolic blood pressure is 130 or higher.  Your diastolic blood pressure is 80 or higher.  Take medicines only as told  by your health care provider. Follow the directions carefully. Blood pressure medicines must be taken as prescribed. The medicine does not work as well when you skip doses. Skipping doses also puts you at risk for problems. Contact a health care provider if:  You think you are having a reaction to medicines you have taken.  You have repeated (recurrent) headaches.  You feel dizzy.  You have swelling in your ankles.  You have trouble with your vision. Get help right away if:  You develop a severe headache or confusion.  You have unusual weakness or numbness, or you feel faint.  You have severe pain in your chest or abdomen.  You vomit repeatedly.  You have trouble breathing. Summary  Hypertension is when the force of blood pumping through your arteries is too strong. If this condition is not controlled, it may put you at risk for serious complications.  Your personal target blood pressure may vary depending on your medical conditions, your age, and other factors. For most people, a normal blood pressure is less than 120/80.  Hypertension is managed by lifestyle changes, medicines, or both. Lifestyle changes include weight loss, eating a healthy, low-sodium diet, exercising more, and limiting alcohol. This information is not intended to replace advice   given to you by your health care provider. Make sure you discuss any questions you have with your health care provider. Document Released: 07/01/2012 Document Revised: 09/04/2016 Document Reviewed: 09/04/2016 Elsevier Interactive Patient Education  2018 Elsevier Inc.  

## 2018-05-15 NOTE — Progress Notes (Deleted)
Subjective:    Patient ID: Lauren Paul, female    DOB: Feb 19, 1966, 52 y.o.   MRN: [id number]  05/15/2018  Hypertension (3 month follow-up )    HPI This 52 y.o. female presents for evaluation of hypertension.     BP Readings from Last 3 Encounters:  02/28/18 112/70  02/09/18 138/82  01/12/18 127/85   Wt Readings from Last 3 Encounters:  02/28/18 174 lb (78.9 kg)  02/09/18 174 lb (78.9 kg)  01/12/18 175 lb (79.4 kg)   Immunization History  Administered Date(s) Administered  . Tdap 05/11/2014    Review of Systems  Past Medical History:  Diagnosis Date  . Allergy   . Anemia   . Arthritis   . Asthma   . Headache    migraines  . Hypersomnolence 06/19/2014  . Hypertension   . Meningitis   . Rectal fissure   . Sepsis ([place])   . Ulcer    Past Surgical History:  Procedure Laterality Date  . ABLATION    . DILATION AND CURETTAGE OF UTERUS    . TUBAL LIGATION     Allergies  Allergen Reactions  . Benadryl [Diphenhydramine Hcl] Shortness Of Breath    Pt states she can take Childrens Benadryl dye free fine.  . Diphenhydramine Hcl Anaphylaxis and Swelling  . Doxycycline Swelling  . Latex Swelling  . Penicillins Anaphylaxis    Pt states she can not take anything in the "cillin" family.  Marland Kitchen Apap-Pamabrom-Pyrilamine   . Iodides   . Losartan Other (See Comments)    LEG CRAMPS  . Olmesartan     Leg cramp   . Other     Watermelon- Anaphylaxis Nuts Melon  . Shellfish Allergy   . Sulfa Antibiotics   . Valsartan     Fatigue and alopecia   Current Outpatient Medications on File Prior to Visit  Medication Sig Dispense Refill  . EPINEPHrine (EPIPEN 2-PAK) 0.3 mg/0.3 mL IJ SOAJ injection Use as directed for severe allergic reaction 2 Device 2  . escitalopram (LEXAPRO) 20 MG tablet TAKE 1 TABLET BY MOUTH EVERY DAY 90 tablet 0  . gabapentin (NEURONTIN) 300 MG capsule TAKE 1 CAPSULE BY MOUTH THREE TIMES A DAY  0  . hydrochlorothiazide (HYDRODIURIL) 12.5 MG tablet TAKE  1 TABLET BY MOUTH EVERY DAY 90 tablet 1  . metoprolol succinate (TOPROL-XL) 25 MG 24 hr tablet Take 1 tablet (25 mg total) by mouth daily. 90 tablet 1  . Multiple Vitamin (MULTIVITAMIN) capsule Take 1 capsule by mouth daily.    . SF 5000 PLUS 1.1 % CREA dental cream BRUSH WITH A PEA SIZED AMOUNT TWICE PER DAY FOR 3 MINUTES DO NOT RINSE EAT OR DRINK FOR 30 MINUTES AFTER  3  . traZODone (DESYREL) 50 MG tablet Take 1-2 tablets (50-100 mg total) by mouth at bedtime as needed. for sleep 180 tablet 1  . VIMOVO 500-20 MG TBEC TAKE ONE TABLET BY MOUTH TWICE DAILY 30 MINUTES BEFORE MEALS  1   No current facility-administered medications on file prior to visit.    Social History   Socioeconomic History  . Marital status: Single    Spouse name: Not on file  . Number of children: 0  . Years of education: 70  . Highest education level: Not on file  Occupational History  . Occupation: CLERICAL    Comment: [employer]  Social Needs  . Financial resource strain: Not on file  . Food insecurity:    Worry: Not on file  Inability: Not on file  . Transportation needs:    Medical: Not on file    Non-medical: Not on file  Tobacco Use  . Smoking status: Never Smoker  . Smokeless tobacco: Never Used  Substance and Sexual Activity  . Alcohol use: Yes    Alcohol/week: 1.2 oz    Types: 2 Standard drinks or equivalent per week    Comment: 2-3 glasses of wkly  . Drug use: No  . Sexual activity: Yes    Birth control/protection: Surgical  Lifestyle  . Physical activity:    Days per week: Not on file    Minutes per session: Not on file  . Stress: Not on file  Relationships  . Social connections:    Talks on phone: Not on file    Gets together: Not on file    Attends religious service: Not on file    Active member of club or organization: Not on file    Attends meetings of clubs or organizations: Not on file    Relationship status: Not on file  . Intimate partner violence:    Fear of current or ex  partner: Not on file    Emotionally abused: Not on file    Physically abused: Not on file    Forced sexual activity: Not on file  Other Topics Concern  . Not on file  Social History Narrative   Marital status:  Single; not dating.  Not interested in 2018.      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      Tobacco:  None      Alcohol:  Weekends; 5 glasses of wine weekly      Exercise:  Sporadic.       Caffeine: One cup of caffeine daily.         Family History  Problem Relation Age of Onset  . Bladder Cancer Mother 29  . Uterine cancer Mother   . Cancer Mother 82       Uterine and bladder cancer  . Hypertension Mother   . Multiple sclerosis Mother   . Lung cancer Father   . Cancer Father 71       lung cancer  . Hypertension Sister   . Allergic rhinitis Neg Hx   . Angioedema Neg Hx   . Asthma Neg Hx   . Eczema Neg Hx   . Immunodeficiency Neg Hx   . Urticaria Neg Hx        Objective:    There were no vitals taken for this visit. Physical Exam No results found. Depression screen [facility] 2/9 05/15/2018 02/28/2018 02/09/2018 02/09/2018 01/12/2018  Decreased Interest 0 0 0 0 0  Down, Depressed, Hopeless 0 0 0 0 0  PHQ - 2 Score 0 0 0 0 0   Fall Risk  05/15/2018 02/28/2018 02/09/2018 02/09/2018 01/12/2018  Falls in the past year? No No No No No        Assessment & Plan:  No diagnosis found.  ***  No orders of the defined types were placed in this encounter.  No orders of the defined types were placed in this encounter.   No follow-ups on file.   Hallie Ertl Elayne Guerin, M.D. Primary Care at [facility] previously [facility] [place], [place]  [address] [phone] phone [phone] fax

## 2018-05-15 NOTE — Progress Notes (Signed)
Subjective:    Patient ID: Lauren Paul, female    DOB: January 04, 1966, 52 y.o.   MRN: [id number]  05/15/2018  Hypertension (3 month follow-up )    HPI This 52 y.o. female presents for hypertension, obesity. No medications for two weeks now. Highest readings off of medication 138/80-168/88. Exercising none due to mother being hospitalized; was exercising five days per week.  Had lost five pounds.  Plans to get back on track next week.  Planning to get some help in the home; mother not ready to live with patient.   Readings more in 140s.   Having a headache today due to stress.  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   No headaches, dizziness. Ate at 12:30pm; ate lean cuisine.  Excessive sodium.  No indigestion or heartburn; none today.   Chest pain on the way into the office.  No SOB.  No nausea.   BP Readings from Last 3 Encounters:  05/15/18 140/80  02/28/18 112/70  02/09/18 138/82   Wt Readings from Last 3 Encounters:  05/15/18 170 lb (77.1 kg)  02/28/18 174 lb (78.9 kg)  02/09/18 174 lb (78.9 kg)   Immunization History  Administered Date(s) Administered  . Tdap 05/11/2014    Review of Systems  Constitutional: Negative for activity change, appetite change, chills, diaphoresis, fatigue, fever and unexpected weight change.  HENT: Negative for congestion, dental problem, drooling, ear discharge, ear pain, facial swelling, hearing loss, mouth sores, nosebleeds, postnasal drip, rhinorrhea, sinus pressure, sneezing, sore throat, tinnitus, trouble swallowing and voice change.   Eyes: Negative for photophobia, pain, discharge, redness, itching and visual disturbance.  Respiratory: Negative for apnea, cough, choking, chest tightness, shortness of breath, wheezing and stridor.   Cardiovascular: Positive for chest pain. Negative for palpitations and leg swelling.  Gastrointestinal: Negative for abdominal distention, abdominal pain, anal bleeding, blood in stool, constipation, diarrhea,  nausea, rectal pain and vomiting.  Endocrine: Negative for cold intolerance, heat intolerance, polydipsia, polyphagia and polyuria.  Genitourinary: Negative for decreased urine volume, difficulty urinating, dyspareunia, dysuria, enuresis, flank pain, frequency, genital sores, hematuria, menstrual problem, pelvic pain, urgency, vaginal bleeding, vaginal discharge and vaginal pain.  Musculoskeletal: Negative for arthralgias, back pain, gait problem, joint swelling, myalgias, neck pain and neck stiffness.  Skin: Negative for color change, pallor, rash and wound.  Allergic/Immunologic: Negative for environmental allergies, food allergies and immunocompromised state.  Neurological: Positive for headaches. Negative for dizziness, tremors, seizures, syncope, facial asymmetry, speech difficulty, weakness, light-headedness and numbness.  Hematological: Negative for adenopathy. Does not bruise/bleed easily.  Psychiatric/Behavioral: Negative for agitation, behavioral problems, confusion, decreased concentration, dysphoric mood, hallucinations, self-injury, sleep disturbance and suicidal ideas. The patient is nervous/anxious. The patient is not hyperactive.     Past Medical History:  Diagnosis Date  . Allergy   . Anemia   . Arthritis   . Asthma   . Headache    migraines  . Hypersomnolence 06/19/2014  . Hypertension   . Meningitis   . Rectal fissure   . Sepsis ([place])   . Ulcer    Past Surgical History:  Procedure Laterality Date  . ABLATION    . DILATION AND CURETTAGE OF UTERUS    . TUBAL LIGATION     Allergies  Allergen Reactions  . Benadryl [Diphenhydramine Hcl] Shortness Of Breath    Pt states she can take Childrens Benadryl dye free fine.  . Diphenhydramine Hcl Anaphylaxis and Swelling  . Doxycycline Swelling  . Latex Swelling  . Penicillins Anaphylaxis    Pt  states she can not take anything in the "cillin" family.  Marland Kitchen Apap-Pamabrom-Pyrilamine   . Iodides   . Losartan Other (See  Comments)    LEG CRAMPS  . Olmesartan     Leg cramp   . Other     Watermelon- Anaphylaxis Nuts Melon  . Shellfish Allergy   . Sulfa Antibiotics   . Valsartan     Fatigue and alopecia   Current Outpatient Medications on File Prior to Visit  Medication Sig Dispense Refill  . EPINEPHrine (EPIPEN 2-PAK) 0.3 mg/0.3 mL IJ SOAJ injection Use as directed for severe allergic reaction 2 Device 2  . escitalopram (LEXAPRO) 20 MG tablet TAKE 1 TABLET BY MOUTH EVERY DAY 90 tablet 0  . gabapentin (NEURONTIN) 300 MG capsule TAKE 1 CAPSULE BY MOUTH THREE TIMES A DAY  0  . hydrochlorothiazide (HYDRODIURIL) 12.5 MG tablet TAKE 1 TABLET BY MOUTH EVERY DAY 90 tablet 1  . metoprolol succinate (TOPROL-XL) 25 MG 24 hr tablet Take 1 tablet (25 mg total) by mouth daily. 90 tablet 1  . Multiple Vitamin (MULTIVITAMIN) capsule Take 1 capsule by mouth daily.    . SF 5000 PLUS 1.1 % CREA dental cream BRUSH WITH A PEA SIZED AMOUNT TWICE PER DAY FOR 3 MINUTES DO NOT RINSE EAT OR DRINK FOR 30 MINUTES AFTER  3  . traZODone (DESYREL) 50 MG tablet Take 1-2 tablets (50-100 mg total) by mouth at bedtime as needed. for sleep 180 tablet 1  . VIMOVO 500-20 MG TBEC TAKE ONE TABLET BY MOUTH TWICE DAILY 30 MINUTES BEFORE MEALS  1   No current facility-administered medications on file prior to visit.    Social History   Socioeconomic History  . Marital status: Single    Spouse name: Not on file  . Number of children: 0  . Years of education: 41  . Highest education level: Not on file  Occupational History  . Occupation: CLERICAL    Comment: [employer]  Social Needs  . Financial resource strain: Not on file  . Food insecurity:    Worry: Not on file    Inability: Not on file  . Transportation needs:    Medical: Not on file    Non-medical: Not on file  Tobacco Use  . Smoking status: Never Smoker  . Smokeless tobacco: Never Used  Substance and Sexual Activity  . Alcohol use: Yes    Alcohol/week: 1.2 oz    Types: 2  Standard drinks or equivalent per week    Comment: 2-3 glasses of wkly  . Drug use: No  . Sexual activity: Yes    Birth control/protection: Surgical  Lifestyle  . Physical activity:    Days per week: Not on file    Minutes per session: Not on file  . Stress: Not on file  Relationships  . Social connections:    Talks on phone: Not on file    Gets together: Not on file    Attends religious service: Not on file    Active member of club or organization: Not on file    Attends meetings of clubs or organizations: Not on file    Relationship status: Not on file  . Intimate partner violence:    Fear of current or ex partner: Not on file    Emotionally abused: Not on file    Physically abused: Not on file    Forced sexual activity: Not on file  Other Topics Concern  . Not on file  Social History Narrative  Marital status:  Single; not dating.  Not interested in 2018.      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      Tobacco:  None      Alcohol:  Weekends; 5 glasses of wine weekly      Exercise:  Sporadic.       Caffeine: One cup of caffeine daily.         Family History  Problem Relation Age of Onset  . Bladder Cancer Mother 53  . Uterine cancer Mother   . Cancer Mother 59       Uterine and bladder cancer  . Hypertension Mother   . Multiple sclerosis Mother   . Lung cancer Father   . Cancer Father 77       lung cancer  . Hypertension Sister   . Allergic rhinitis Neg Hx   . Angioedema Neg Hx   . Asthma Neg Hx   . Eczema Neg Hx   . Immunodeficiency Neg Hx   . Urticaria Neg Hx        Objective:    BP 140/80   Pulse 68   Temp 99 F (37.2 C) (Oral)   Resp 16   Ht 5' (1.524 m)   Wt 170 lb (77.1 kg)   SpO2 99%   BMI 33.20 kg/m  Physical Exam  Constitutional: She is oriented to person, place, and time. She appears well-developed and  well-nourished. No distress.  HENT:  Head: Normocephalic and atraumatic.  Right Ear: External ear normal.  Left Ear: External ear normal.  Nose: Nose normal.  Mouth/Throat: Oropharynx is clear and moist.  Eyes: Pupils are equal, round, and reactive to light. Conjunctivae and EOM are normal.  Neck: Normal range of motion and full passive range of motion without pain. Neck supple. No JVD present. Carotid bruit is not present. No thyromegaly present.  Cardiovascular: Normal rate, regular rhythm and normal heart sounds. Exam reveals no gallop and no friction rub.  No murmur heard. Pulmonary/Chest: Effort normal and breath sounds normal. She has no wheezes. She has no rales.  Abdominal: Soft. Bowel sounds are normal. She exhibits no distension and no mass. There is no tenderness. There is no rebound and no guarding.  Musculoskeletal:       Right shoulder: Normal.       Left shoulder: Normal.       Cervical back: Normal.  Lymphadenopathy:    She has no cervical adenopathy.  Neurological: She is alert and oriented to person, place, and time. She has normal reflexes. No cranial nerve deficit. She exhibits normal muscle tone. Coordination normal.  Skin: Skin is warm and dry. No rash noted. She is not diaphoretic. No erythema. No pallor.  Psychiatric: She has a normal mood and affect. Her behavior is normal. Judgment and thought content normal.  Nursing note and vitals reviewed.  No results found. Depression screen [facility] 2/9 05/15/2018 02/28/2018 02/09/2018 02/09/2018 01/12/2018  Decreased Interest 0 0 0 0 0  Down, Depressed, Hopeless 0 0 0 0 0  PHQ - 2 Score 0 0 0 0 0   Fall Risk  05/15/2018 02/28/2018 02/09/2018 02/09/2018 01/12/2018  Falls in the past year? No No No No No    EKG: NSR; no acute changes.    Assessment & Plan:  1. Other chest pain   2. Essential hypertension, benign   3. Pure hypercholesterolemia   4. Glucose intolerance (impaired glucose tolerance)   5. Class 1 obesity due to  excess calories with serious comorbidity and body mass index (BMI) of 33.0 to 33.9 in adult   6. Stress reaction    Atypical chest pain while driving to office today.  Now resolved.  Patient identifies as a stress reaction to mother's declining health.  Patient is exercising without chest pain.  EKG is within normal limits today.  Hypertension: Moderately controlled.  Patient is holding blood pressure medications and working on exercise and weight loss and low-salt food intake.  Agreeable to holding medications as long as patient is monitoring blood pressure daily.  Stress reaction: New onset of his mother has had multiple health issues in the past few months.  Coping well.  Continue with exercise for stress management.  Hypercholesterolemia, glucose intolerance, obesity with BMI of 33: Highly encourage exercise, weight loss, low-fat and low sugar food choices.   Orders Placed This Encounter  Procedures  . EKG 12-Lead   No orders of the defined types were placed in this encounter.   Return for follow-up chronic medical conditions.   Kristi Elayne Guerin, M.D. Primary Care at [facility] [address] [place],   [address] [phone] phone [phone] fax

## 2018-05-18 ENCOUNTER — Encounter: Payer: Self-pay | Admitting: Family Medicine

## 2018-06-02 ENCOUNTER — Telehealth: Payer: Self-pay | Admitting: Family Medicine

## 2018-06-02 NOTE — Telephone Encounter (Signed)
Called pt to reschedule appt on 08/14/18. Left VM

## 2018-06-11 ENCOUNTER — Other Ambulatory Visit: Payer: Self-pay | Admitting: Family Medicine

## 2018-06-11 ENCOUNTER — Other Ambulatory Visit: Payer: Self-pay

## 2018-06-11 MED ORDER — METOPROLOL SUCCINATE ER 25 MG PO TB24: 25.0000 mg | ORAL_TABLET | Freq: Every day | ORAL | 0 refills | Status: DC

## 2018-06-24 ENCOUNTER — Ambulatory Visit: Payer: Self-pay | Admitting: Urgent Care

## 2018-06-24 ENCOUNTER — Ambulatory Visit: Payer: Self-pay | Admitting: *Deleted

## 2018-06-24 ENCOUNTER — Encounter (HOSPITAL_COMMUNITY): Payer: Self-pay

## 2018-06-24 ENCOUNTER — Emergency Department (HOSPITAL_COMMUNITY)
Admission: EM | Admit: 2018-06-24 | Discharge: 2018-06-24 | Disposition: A | Discharge status: Home / Self Care | Payer: BLUE CROSS/BLUE SHIELD | Attending: Emergency Medicine | Admitting: Emergency Medicine

## 2018-06-24 ENCOUNTER — Other Ambulatory Visit: Payer: Self-pay

## 2018-06-24 ENCOUNTER — Emergency Department (HOSPITAL_COMMUNITY): Payer: BLUE CROSS/BLUE SHIELD

## 2018-06-24 DIAGNOSIS — R51 Headache: Secondary | ICD-10-CM | POA: Insufficient documentation

## 2018-06-24 DIAGNOSIS — J4520 Mild intermittent asthma, uncomplicated: Secondary | ICD-10-CM | POA: Diagnosis not present

## 2018-06-24 DIAGNOSIS — E7800 Pure hypercholesterolemia, unspecified: Secondary | ICD-10-CM | POA: Diagnosis not present

## 2018-06-24 DIAGNOSIS — I10 Essential (primary) hypertension: Secondary | ICD-10-CM | POA: Diagnosis present

## 2018-06-24 DIAGNOSIS — R519 Headache, unspecified: Secondary | ICD-10-CM

## 2018-06-24 LAB — CBC
HEMATOCRIT: 42.6 % (ref 36.0–46.0)
Hemoglobin: 14.5 g/dL (ref 12.0–15.0)
MCH: 29.5 pg (ref 26.0–34.0)
MCHC: 34 g/dL (ref 30.0–36.0)
MCV: 86.8 fL (ref 78.0–100.0)
Platelets: 268 10*3/uL (ref 150–400)
RBC: 4.91 MIL/uL (ref 3.87–5.11)
RDW: 13 % (ref 11.5–15.5)
WBC: 6.6 10*3/uL (ref 4.0–10.5)

## 2018-06-24 LAB — BASIC METABOLIC PANEL
Anion gap: 9 (ref 5–15)
BUN: 15 mg/dL (ref 6–20)
CHLORIDE: 105 mmol/L (ref 98–111)
CO2: 29 mmol/L (ref 22–32)
Calcium: 10.1 mg/dL (ref 8.9–10.3)
Creatinine, Ser: 0.73 mg/dL (ref 0.44–1.00)
GFR calc Af Amer: >60 mL/min (ref >60)
GFR calc non Af Amer: >60 mL/min (ref >60)
Glucose, Bld: 99 mg/dL (ref 70–99)
POTASSIUM: 3.9 mmol/L (ref 3.5–5.1)
Sodium: 143 mmol/L (ref 135–145)

## 2018-06-24 LAB — I-STAT TROPONIN, ED: Troponin i, poc: 0.02 ng/mL (ref 0.00–0.08)

## 2018-06-24 LAB — I-STAT BETA HCG BLOOD, ED (MC, WL, AP ONLY): I-stat hCG, quantitative: <5 m[IU]/mL (ref <5)

## 2018-06-24 MED ORDER — BUTALBITAL-APAP-CAFFEINE 50-325-40 MG PO TABS: 1.0000 | ORAL_TABLET | Freq: Four times a day (QID) | ORAL | 0 refills | Status: DC | PRN

## 2018-06-24 MED ORDER — SUMATRIPTAN SUCCINATE 6 MG/0.5ML ~~LOC~~ SOLN
6.0000 mg | Freq: Once | SUBCUTANEOUS | Status: AC
  Administered 2018-06-24: 6 mg via SUBCUTANEOUS
  Filled 2018-06-24: qty 0.5

## 2018-06-24 MED ORDER — KETOROLAC TROMETHAMINE 30 MG/ML IJ SOLN
30.0000 mg | Freq: Once | INTRAMUSCULAR | Status: AC
  Administered 2018-06-24: 30 mg via INTRAVENOUS
  Filled 2018-06-24: qty 1

## 2018-06-24 MED ORDER — HYDROCHLOROTHIAZIDE 12.5 MG PO CAPS
25.0000 mg | ORAL_CAPSULE | Freq: Once | ORAL | Status: AC
  Administered 2018-06-24: 25 mg via ORAL
  Filled 2018-06-24: qty 2

## 2018-06-24 MED ORDER — DEXAMETHASONE SODIUM PHOSPHATE 10 MG/ML IJ SOLN
10.0000 mg | Freq: Once | INTRAMUSCULAR | Status: AC
  Administered 2018-06-24: 10 mg via INTRAVENOUS
  Filled 2018-06-24: qty 1

## 2018-06-24 MED ORDER — HYDROCHLOROTHIAZIDE 25 MG PO TABS: 25.0000 mg | ORAL_TABLET | Freq: Every day | ORAL | 0 refills | Status: DC

## 2018-06-24 MED ORDER — METOCLOPRAMIDE HCL 5 MG/ML IJ SOLN
10.0000 mg | Freq: Once | INTRAMUSCULAR | Status: AC
  Administered 2018-06-24: 10 mg via INTRAVENOUS
  Filled 2018-06-24: qty 2

## 2018-06-24 MED ORDER — LACTATED RINGERS IV BOLUS
1000.0000 mL | Freq: Once | INTRAVENOUS | Status: AC
  Administered 2018-06-24: 1000 mL via INTRAVENOUS

## 2018-06-24 NOTE — Telephone Encounter (Signed)
Pt is being evaluated at the  ER today.

## 2018-06-24 NOTE — ED Notes (Signed)
Patient transported to CT 

## 2018-06-24 NOTE — ED Notes (Signed)
ED Provider at bedside. 

## 2018-06-24 NOTE — ED Triage Notes (Addendum)
Pt states she was sent over by her PCP. Pt states that her BP was 190/112. Pt also states she has had a headache since Monday.  Pt also states she has some chest pain

## 2018-06-24 NOTE — ED Provider Notes (Signed)
Emergency Department Provider Note   I have reviewed the triage vital signs and the nursing notes.   HISTORY  Chief Complaint Hypertension   HPI Lauren Paul is a 52 y.o. female history of multiple things as documented below but most importantly for hypertension.  She is been off her medications for couple months now to see how her blood pressures did but they have consistently been high and even higher in the last few days.  She had associated headache, blurry vision, chest pain with it which are normal and her blood pressure was high.  She checked it at home and it was as high as 433 systolic.  Talk to her doctor sent her here for further evaluation.  At this time she has a little bit of vague chest discomfort, blurry vision, headache.  Normal urination and breathing.  No lower extremity swelling.  No rashes.  No other medication changes.  No alcohol or drugs or tobacco. No other associated or modifying symptoms.    Past Medical History:  Diagnosis Date  . Allergy   . Anemia   . Arthritis   . Asthma   . Headache    migraines  . Hypersomnolence 06/19/2014  . Hypertension   . Meningitis   . Rectal fissure   . Sepsis ([place])   . Ulcer     Patient Active Problem List   Diagnosis Date Noted  . Pure hypercholesterolemia 08/06/2017  . 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 Multiple drug allergies 07/21/2017  . Mild intermittent asthma without complication 29/51/8841  . Other allergic rhinitis 07/21/2017  . Spinal stenosis of lumbar region with neurogenic claudication 04/20/2017  . Obesity 08/27/2016  . OSA (obstructive sleep apnea) 08/12/2016  . Glucose intolerance (impaired glucose tolerance) 08/06/2016  . Menopausal hot flushes 08/06/2016  . Essential hypertension, benign 06/19/2014    Past Surgical History:  Procedure Laterality Date  . ABLATION    . DILATION AND CURETTAGE OF UTERUS    . TUBAL  LIGATION      Current Outpatient Rx  . Order #: [id number] Class: Normal  . Order #: [id number] Class: Historical Med  . Order #: [id number] Class: Historical Med  . Order #: [id number] Class: Historical Med  . Order #: [id number] Class: Normal  . Order #: [id number] Class: Historical Med  . Order #: [id number] Class: Normal  . Order #: [id number] Class: Normal  . Order #: [id number] Class: Normal    Allergies Bee venom; Benadryl [diphenhydramine hcl]; Diphenhydramine hcl; Doxycycline; Latex; Penicillins; Apap-pamabrom-pyrilamine; Iodides; Losartan; Olmesartan; Other; Shellfish allergy; Sulfa antibiotics; and Valsartan  Family History  Problem Relation Age of Onset  . Bladder Cancer Mother 45  . Uterine cancer Mother   . Cancer Mother 79       Uterine and bladder cancer  . Hypertension Mother   . Multiple sclerosis Mother   . Lung cancer Father   . Cancer Father 76       lung cancer  . Hypertension Sister   . Allergic rhinitis Neg Hx   . Angioedema Neg Hx   . Asthma Neg Hx   . Eczema Neg Hx   . Immunodeficiency Neg Hx   . Urticaria Neg Hx     Social History Social History   Tobacco Use  . Smoking status: Never Smoker  . Smokeless tobacco: Never Used  Substance Use Topics  . Alcohol use: Yes    Alcohol/week: 2.0 standard drinks    Types: 2 Standard drinks or equivalent per  week    Comment: 2-3 glasses of wkly  . Drug use: No    Review of Systems  All other systems negative except as documented in the HPI. All pertinent positives and negatives as reviewed in the HPI. ____________________________________________   PHYSICAL EXAM:  VITAL SIGNS: ED Triage Vitals  Enc Vitals Group     BP 06/24/18 1040 (!) 207/115     Pulse Rate 06/24/18 1040 (!) 56     Resp 06/24/18 1040 16     Temp 06/24/18 1040 97.9 F (36.6 C)     Temp Source 06/24/18 1040 Oral     SpO2 06/24/18 1040 100 %     Weight 06/24/18 1045 165 lb (74.8 kg)     Height 06/24/18 1045 5' (1.524 m)     Constitutional: Alert and oriented. Well appearing and in no acute distress. Eyes: Conjunctivae are normal. PERRL. EOMI. Head: Atraumatic. Nose: No congestion/rhinnorhea. Mouth/Throat: Mucous membranes are moist.  Oropharynx non-erythematous. Neck: No stridor.  No meningeal signs.   Cardiovascular: Normal rate, regular rhythm. Good peripheral circulation. Grossly normal heart sounds.   Respiratory: Normal respiratory effort.  No retractions. Lungs CTAB. Gastrointestinal: Soft and nontender. No distention.  Musculoskeletal: No lower extremity tenderness nor edema. No gross deformities of extremities. Neurologic:  Normal speech and language. No gross focal neurologic deficits are appreciated. No altered mental status, able to give full seemingly accurate history.  Face is symmetric, EOM's intact, pupils equal and reactive, vision intact, tongue and uvula midline without deviation. Upper and Lower extremity motor 5/5, intact pain perception in distal extremities, 2+ reflexes in biceps, patella and achilles tendons. Able to perform finger to nose normal with both hands. Walks without assistance or evident ataxia.  Skin:  Skin is warm, dry and intact. No rash noted.  ____________________________________________   LABS (all labs ordered are listed, but only abnormal results are displayed)  Labs Reviewed  BASIC METABOLIC PANEL  CBC  I-STAT TROPONIN, ED  I-STAT BETA HCG BLOOD, ED ([facility], [facility], [facility] ONLY)   ____________________________________________  EKG   EKG Interpretation  Date/Time:  Wednesday June 24 2018 10:44:02 EDT Ventricular Rate:  50 PR Interval:    QRS Duration: 88 QT Interval:  460 QTC Calculation: 420 R Axis:   19 Text Interpretation:  Sinus rhythm Low voltage, precordial leads RSR' in V1 or V2, right VCD or RVH Baseline wander in lead(s) V4 V6 No old tracing to compare Confirmed by Merrily Pew [phone]) on 06/24/2018 11:21:46 AM       ____________________________________________  RADIOLOGY  Dg Chest 2 View  Result Date: 06/24/2018 CLINICAL DATA:  Chest pain. EXAM: CHEST - 2 VIEW COMPARISON:  11/19/2010 FINDINGS: The heart size and mediastinal contours are within normal limits. Both lungs are clear. The visualized skeletal structures are unremarkable. IMPRESSION: Normal exam. Electronically Signed   By: Lorriane Shire M.D.   On: 06/24/2018 12:13   Ct Head Wo Contrast  Result Date: 06/24/2018 CLINICAL DATA:  Worst headache of life for 3 days. Nausea. EXAM: CT HEAD WITHOUT CONTRAST TECHNIQUE: Contiguous axial images were obtained from the base of the skull through the vertex without intravenous contrast. COMPARISON:  None. FINDINGS: Brain: There is no evidence of acute infarct, intracranial hemorrhage, mass, midline shift, or extra-axial fluid collection. The ventricles and sulci are normal. Scattered cerebral white matter hypodensities are nonspecific but compatible with mild chronic small vessel ischemic disease. Vascular: Calcified atherosclerosis at the skull base. No hyperdense vessel. Skull: No fracture or focal osseous lesion.  Sinuses/Orbits: Visualized paranasal sinuses and mastoid air cells are clear. Orbits are unremarkable. Other: None. IMPRESSION: 1. No evidence of acute intracranial abnormality. 2. Mild chronic small vessel ischemic disease. Electronically Signed   By: Logan Bores M.D.   On: 06/24/2018 12:27    ____________________________________________   INITIAL IMPRESSION / ASSESSMENT AND PLAN / ED COURSE  Patient without evidence of hypertensive crisis.  Symptoms improved with improved blood pressure and symptomatic treatment here in the emergency room.  We will follow-up with her primary doctor.  Started back her hydrochlorothiazide but will hold on the metoprolol as she has a low heart rate and thought that she had a bad reaction to it.   Pertinent labs & imaging results that were available during my care  of the patient were reviewed by me and considered in my medical decision making (see chart for details).  ____________________________________________  FINAL CLINICAL IMPRESSION(S) / ED DIAGNOSES  Final diagnoses:  Nonintractable headache, unspecified chronicity pattern, unspecified headache type     MEDICATIONS GIVEN DURING THIS VISIT:  Medications  hydrochlorothiazide (MICROZIDE) capsule 25 mg (25 mg Oral Given 06/24/18 1228)  metoCLOPramide (REGLAN) injection 10 mg (10 mg Intravenous Given 06/24/18 1229)  lactated ringers bolus 1,000 mL (0 mLs Intravenous Stopped 06/24/18 1331)  ketorolac (TORADOL) 30 MG/ML injection 30 mg (30 mg Intravenous Given 06/24/18 1229)  dexamethasone (DECADRON) injection 10 mg (10 mg Intravenous Given 06/24/18 1229)  SUMAtriptan (IMITREX) injection 6 mg (6 mg Subcutaneous Given 06/24/18 1425)    NEW OUTPATIENT MEDICATIONS STARTED DURING THIS VISIT:  New Prescriptions   BUTALBITAL-ACETAMINOPHEN-CAFFEINE (FIORICET, ESGIC) 50-325-40 MG TABLET    Take 1-2 tablets by mouth every 6 (six) hours as needed for headache.   HYDROCHLOROTHIAZIDE (HYDRODIURIL) 25 MG TABLET    Take 1 tablet (25 mg total) by mouth daily.    Note:  This note was prepared with assistance of Dragon voice recognition software. Occasional wrong-word or sound-a-like substitutions may have occurred due to the inherent limitations of voice recognition software.   Merrily Pew, MD 06/24/18 [phone]

## 2018-06-24 NOTE — Telephone Encounter (Signed)
Pt reports BP last night at 1900 was 191/120; home monitor.  Has not checked BP this AM. Reports headache x 2 days, "Some" blurred vision. Denies dizziness, CP, SOB, weakness. States was taken off her BP meds  "About 2 months ago and it has been good." Pt has no other readings. Directed pt to ED which she adamantly declined. Pt is at work; instructed to have BP checked, able to do so near workplace. Will CB with reading.    1012: Pt called back with reading of 190/112. Still with headache, blurred vision, nausea. Pt states will follow disposition of ED.  Reason for Disposition . [7] Systolic BP  >= 628 OR Diastolic >= 315 AND [1] cardiac or neurologic symptoms (e.g., chest pain, difficulty breathing, unsteady gait, blurred vision)    Blurred vision, headache, nausea  Answer Assessment - Initial Assessment Questions 1. BLOOD PRESSURE: "What is the blood pressure?" "Did you take at least two measurements 5 minutes apart?"     191/120 2. ONSET: "When did you take your blood pressure?"     Last night at 1900 3. HOW: "How did you obtain the blood pressure?" (e.g., visiting nurse, automatic home BP monitor)     Home monitor, arm 4. HISTORY: "Do you have a history of high blood pressure?"     yes 5. MEDICATIONS: "Are you taking any medications for blood pressure?" "Have you missed any doses recently?"     Off x 2 months 6. OTHER SYMPTOMS: "Do you have any symptoms?" (e.g., headache, chest pain, blurred vision, difficulty breathing, weakness)     Headache 8/10 , nausea, no vomiting, vision blurry  Protocols used: HIGH BLOOD PRESSURE-A-AH

## 2018-06-29 ENCOUNTER — Encounter: Payer: Self-pay | Admitting: Physician Assistant

## 2018-06-29 ENCOUNTER — Ambulatory Visit: Payer: Self-pay | Admitting: *Deleted

## 2018-06-29 ENCOUNTER — Other Ambulatory Visit: Payer: Self-pay

## 2018-06-29 ENCOUNTER — Ambulatory Visit (INDEPENDENT_AMBULATORY_CARE_PROVIDER_SITE_OTHER): Payer: BLUE CROSS/BLUE SHIELD | Admitting: Physician Assistant

## 2018-06-29 VITALS — BP 124/70 | HR 88 | Temp 98.6°F | Resp 18 | Ht 60.0 in | Wt 160.6 lb

## 2018-06-29 DIAGNOSIS — R109 Unspecified abdominal pain: Secondary | ICD-10-CM

## 2018-06-29 DIAGNOSIS — G43819 Other migraine, intractable, without status migrainosus: Secondary | ICD-10-CM | POA: Diagnosis not present

## 2018-06-29 DIAGNOSIS — R300 Dysuria: Secondary | ICD-10-CM | POA: Diagnosis not present

## 2018-06-29 DIAGNOSIS — M62838 Other muscle spasm: Secondary | ICD-10-CM

## 2018-06-29 DIAGNOSIS — R110 Nausea: Secondary | ICD-10-CM

## 2018-06-29 DIAGNOSIS — R197 Diarrhea, unspecified: Secondary | ICD-10-CM

## 2018-06-29 DIAGNOSIS — R42 Dizziness and giddiness: Secondary | ICD-10-CM | POA: Diagnosis not present

## 2018-06-29 DIAGNOSIS — I10 Essential (primary) hypertension: Secondary | ICD-10-CM

## 2018-06-29 MED ORDER — PROMETHAZINE HCL 25 MG PO TABS: 25.0000 mg | ORAL_TABLET | Freq: Three times a day (TID) | ORAL | 0 refills | Status: DC | PRN

## 2018-06-29 MED ORDER — DICYCLOMINE HCL 10 MG PO CAPS: 10.0000 mg | ORAL_CAPSULE | Freq: Three times a day (TID) | ORAL | 0 refills | Status: DC

## 2018-06-29 MED ORDER — PREDNISONE 10 MG PO TABS: ORAL_TABLET | ORAL | 0 refills | Status: DC

## 2018-06-29 MED ORDER — CYCLOBENZAPRINE HCL 5 MG PO TABS: 5.0000 mg | ORAL_TABLET | Freq: Three times a day (TID) | ORAL | 0 refills | Status: DC | PRN

## 2018-06-29 NOTE — Progress Notes (Signed)
Lauren Paul  MRN: [id number] DOB: 01/31/1966  PCP: Rutherford Guys, MD  Chief Complaint  Patient presents with  . Hypertension  . Nausea    went to ER but still having problems     Subjective:  Pt presents to clinic for her blood pressure continued.  2 family members in the hospital prior to her elevated BP.  She has had a headache for about a month.  She started checking her BP 9/3 and it was really high, 175/106.  She went to the ED 9/4 and they put her back on her HCTZ (she had gone off Toprol and HCTZ about a month ago due to controlled BP at the time).  Her BP at home has returned to normal but she still feels poorly with headache and nausea and lightheaded feeling with head movement.  She still has diarrhea if she eats anything.  She has tried to keep hydrate.  She fell in the bathtub 3 weeks ago but she did not have LOC and she did not hit her head.  She has h/o migraines and this feels like that but then her BP was also high.  She would get a cocktail of depakote, demerol and phenergan in the past years ago with her migraines.  Headache is back of neck bilateral and bilateral occiput, and right temple and right eye. Her right cheek is sore to the touch.   No recent abx.  Diarrhea started after ED visit - like water - abd cramping prior to diarrhea.  No sick contacts.  No recent travel.  She is nauseated.  Timing is based on when she takes in foods.  Patient has history of aseptic meningitis but per patient this feels different, she has no problems moving her head.  History is obtained by patient.  Review of Systems  Constitutional: Negative for chills and fever.  Gastrointestinal: Positive for nausea.  Musculoskeletal: Negative for neck pain and neck stiffness.  Neurological: Positive for headaches. Negative for syncope, speech difficulty, weakness, light-headedness and numbness.    Patient Active Problem List   Diagnosis Date Noted  . Pure hypercholesterolemia  08/06/2017  . 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 Multiple drug allergies 07/21/2017  . Mild intermittent asthma without complication 41/66/0630  . Other allergic rhinitis 07/21/2017  . Spinal stenosis of lumbar region with neurogenic claudication 04/20/2017  . Obesity 08/27/2016  . OSA (obstructive sleep apnea) 08/12/2016  . Glucose intolerance (impaired glucose tolerance) 08/06/2016  . Menopausal hot flushes 08/06/2016  . Essential hypertension, benign 06/19/2014    Current Outpatient Medications on File Prior to Visit  Medication Sig Dispense Refill  . butalbital-acetaminophen-caffeine (FIORICET, ESGIC) 50-325-40 MG tablet Take 1-2 tablets by mouth every 6 (six) hours as needed for headache. 20 tablet 0  . EPINEPHrine (EPIPEN 2-PAK) 0.3 mg/0.3 mL IJ SOAJ injection Use as directed for severe allergic reaction 2 Device 2  . escitalopram (LEXAPRO) 20 MG tablet TAKE 1 TABLET BY MOUTH EVERY DAY 90 tablet 0  . gabapentin (NEURONTIN) 300 MG capsule Take 300 mg by mouth 3 (three) times daily.   0  . hydrochlorothiazide (HYDRODIURIL) 25 MG tablet Take 1 tablet (25 mg total) by mouth daily. 30 tablet 0  . Multiple Vitamin (MULTIVITAMIN) capsule Take 1 capsule by mouth daily.    . SF 5000 PLUS 1.1 % CREA dental cream Place 1 application onto teeth daily.   3  . traZODone (DESYREL) 50 MG tablet  Take 1-2 tablets (50-100 mg total) by mouth at bedtime as needed. for sleep 180 tablet 1  . VIMOVO 500-20 MG TBEC Take 1 tablet by mouth 2 (two) times daily. Before meals  1   No current facility-administered medications on file prior to visit.     Allergies  Allergen Reactions  . Bee Venom Anaphylaxis  . Benadryl [Diphenhydramine Hcl] Shortness Of Breath    Pt states she can take Childrens Benadryl dye free fine.  . Diphenhydramine Hcl Anaphylaxis and Swelling  . Doxycycline Swelling  . Latex Swelling  . Penicillins  Anaphylaxis    Has patient had a PCN reaction causing immediate rash, facial/tongue/throat swelling, SOB or lightheadedness with hypotension: Yes Has patient had a PCN reaction causing severe rash involving mucus membranes or skin necrosis: No Has patient had a PCN reaction that required hospitalization: Unknown Has patient had a PCN reaction occurring within the last 10 years: No If all of the above answers are "NO", then may proceed with Cephalosporin use.   Marland Kitchen Apap-Pamabrom-Pyrilamine   . Iodides   . Losartan Other (See Comments)    LEG CRAMPS  . Olmesartan     Leg cramp   . Other     Watermelon- Anaphylaxis Nuts Melon  . Shellfish Allergy   . Sulfa Antibiotics   . Valsartan     Fatigue and alopecia    Past Medical History:  Diagnosis Date  . Allergy   . Anemia   . Arthritis   . Asthma   . Headache    migraines  . Hypersomnolence 06/19/2014  . Hypertension   . Meningitis   . Rectal fissure   . Sepsis ([place])   . Ulcer    Social History   Social History Narrative   Marital status:  Single; not dating.  Not interested in 2018.      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      Tobacco:  None      Alcohol:  Weekends; 5 glasses of wine weekly      Exercise:  Sporadic.       Caffeine: One cup of caffeine daily.         Social History   Tobacco Use  . Smoking status: Never Smoker  . Smokeless tobacco: Never Used  Substance Use Topics  . Alcohol use: Yes    Alcohol/week: 2.0 standard drinks    Types: 2 Standard drinks or equivalent per week    Comment: 2-3 glasses of wkly  . Drug use: No   family history includes Bladder Cancer (age of onset: 40) in her mother; Cancer (age of onset: 28) in her father; Cancer (age of onset: 45) in her mother; Hypertension in her mother and sister; Lung cancer in her father; Multiple sclerosis in her  mother; Uterine cancer in her mother.     Objective:  BP 124/70   Pulse 88   Temp 98.6 F (37 C) (Oral)   Resp 18   Ht 5' (1.524 m)   Wt 160 lb 9.6 oz (72.8 kg)   SpO2 100%   BMI 31.37 kg/m  Body mass index is 31.37 kg/m.  Wt Readings from Last 3 Encounters:  06/29/18 160 lb 9.6 oz (72.8 kg)  06/24/18 165 lb (74.8 kg)  05/15/18 170 lb (77.1 kg)  Physical Exam  Constitutional: She is oriented to person, place, and time. She appears well-developed and well-nourished.  HENT:  Head: Normocephalic and atraumatic.  Right Ear: Hearing and external ear normal.  Left Ear: Hearing and external ear normal.  Eyes: Pupils are equal, round, and reactive to light. Conjunctivae, EOM and lids are normal.  Neck: Normal range of motion.  Cardiovascular: Normal rate, regular rhythm and normal heart sounds.  No murmur heard. Pulmonary/Chest: Effort normal and breath sounds normal. She has no wheezes.  Musculoskeletal:       Cervical back: She exhibits spasm (Trapezius bilateral). She exhibits normal range of motion and no tenderness.  Neurological: She is alert and oriented to person, place, and time. She has normal strength and normal reflexes. She displays normal reflexes. No sensory deficit. Coordination and gait normal.  Skin: Skin is warm and dry.  Psychiatric: Judgment normal.  Vitals reviewed.  Results for orders placed or performed in visit on 06/29/18  Urinalysis, dipstick only  Result Value Ref Range   Specific Gravity, UA 1.014 1.005 - 1.030   pH, UA 6.5 5.0 - 7.5   Color, UA Yellow Yellow   Appearance Ur Clear Clear   Leukocytes, UA Negative Negative   Protein, UA Negative Negative/Trace   Glucose, UA Negative Negative   Ketones, UA Negative Negative   RBC, UA Negative Negative   Bilirubin, UA Negative Negative   Urobilinogen, Ur 0.2 0.2 - 1.0 mg/dL   Nitrite, UA Negative Negative    Assessment and Plan :  Lightheaded - Plan: Orthostatic vital signs  Dysuria -  Plan: Urinalysis, dipstick only  Diarrhea, unspecified type - Plan: Orthostatic vital signs  Other migraine without status migrainosus, intractable - Plan: predniSONE (DELTASONE) 10 MG tablet  Muscle spasm - Plan: cyclobenzaprine (FLEXERIL) 5 MG tablet  Abdominal cramping - Plan: dicyclomine (BENTYL) 10 MG capsule  Nausea - Plan: promethazine (PHENERGAN) 25 MG tablet  Essential hypertension, benign   Suspect her elevated blood pressure in the emergency department was related to her migraine.  She is not dehydrated but she will continue to hydrate well.  Gave her medication to help with her nausea.  I suspect her headache is partially due to muscle spasms in her trapezius and Flexeril will be used for that.  She needs to follow-up with a neurologist since she has had another bout of this migraine and in the past no oral medicines or rescue medicines have worked well for her she will likely need to be set up for infusion for headaches if necessary in the future.  We will try prednisone taper to see if we can stop this current headache.  Warning signs were given and when patient should get emergent care.  Patient verbalized to me that they understand the following: diagnosis, what is being done for them, what to expect and what should be done at home.  Their questions have been answered.  See after visit summary for patient specific instructions.  Windell Hummingbird PA-C  [facility] 07/07/2018 8:54 PM  Please note: Portions of this report may have been transcribed using dragon voice recognition software. Every effort was made to ensure accuracy; however, inadvertent computerized transcription errors may be present.

## 2018-06-29 NOTE — Patient Instructions (Addendum)
Take the phenergan as needed for nausea For the prednsione - you are going to do a taper - for the days dose - starts at 80mg  (8 pills) and goes to 10mg  (1 pill) - please take the pills in the am with food.  Get the name of your neurologist for a referral     If you have lab work done today you will be contacted with your lab results within the next 2 weeks.  If you have not heard from [place] then please contact us. The fastest way to get your results is to register for My Chart.   IF you received an x-ray today, you will receive an invoice from [facility]. Please contact [facility] at [phone] with questions or concerns regarding your invoice.   IF you received labwork today, you will receive an invoice from [facility]. Please contact [facility] at [phone] with questions or concerns regarding your invoice.   Our billing staff will not be able to assist you with questions regarding bills from these companies.  You will be contacted with the lab results as soon as they are available. The fastest way to get your results is to activate your My Chart account. Instructions are located on the last page of this paperwork. If you have not heard from [place] regarding the results in 2 weeks, please contact this office.

## 2018-06-29 NOTE — Telephone Encounter (Signed)
Pt reports diarrhea x 3 days. States 2 in last 24 hours but "I've only eaten twice, everything I eat goes right through me."  Reports nausea, no vomiting. States afebrile.  Intermittent abdominal cramping "Right before BM."  States stools are watery, no bleeding noted. Also reports lightheadedness x 4 days, mild, intermittent. States is staying hydrated. Was seen in ED 06/24/18 for BP of  207/115; BP last night 123/89  HR92.  Pt states BP has "Been good." Denies headache. Placed on hydrodiuril 25mg  QD. Same day appt made with S. Weber. Care advise given per protocol.   Reason for Disposition . [1] MODERATE diarrhea (e.g., 4-6 times / day more than normal) AND [2] present > 48 hours (2 days)    States 2 watery stools in past 24 hours but "Everything I eat goes right through me. I've only eaten twice in 24 hours."  Answer Assessment - Initial Assessment Questions 1. DIARRHEA SEVERITY: "How bad is the diarrhea?" "How many extra stools have you had in the past 24 hours than normal?"    - NO DIARRHEA (SCALE 0)   - MILD (SCALE 1-3): Few loose or mushy BMs; increase of 1-3 stools over normal daily number of stools; mild increase in ostomy output.   -  MODERATE (SCALE 4-7): Increase of 4-6 stools daily over normal; moderate increase in ostomy output. * SEVERE (SCALE 8-10; OR 'WORST POSSIBLE'): Increase of 7 or more stools daily over normal; moderate increase in ostomy output; incontinence.     2, after eating each time. 2. ONSET: "When did the diarrhea begin?"      Friday 06/26/18 3. BM CONSISTENCY: "How loose or watery is the diarrhea?"      Watery 4. VOMITING: "Are you also vomiting?" If so, ask: "How many times in the past 24 hours?"      Nausea, no vomiting 5. ABDOMINAL PAIN: "Are you having any abdominal pain?" If yes: "What does it feel like?" (e.g., crampy, dull, intermittent, constant)     Cramping right before BMs 6. ABDOMINAL PAIN SEVERITY: If present, ask: "How bad is the pain?"  (e.g., Scale  1-10; mild, moderate, or severe)   - MILD (1-3): doesn't interfere with normal activities, abdomen soft and not tender to touch    - MODERATE (4-7): interferes with normal activities or awakens from sleep, tender to touch    - SEVERE (8-10): excruciating pain, doubled over, unable to do any normal activities       7-8/10 7. ORAL INTAKE: If vomiting, "Have you been able to drink liquids?" "How much fluids have you had in the past 24 hours?"     Yes, drinking water, ginger ale. Unsure of amount. States is staying hydrated. 8. HYDRATION: "Any signs of dehydration?" (e.g., dry mouth [not just dry lips], too weak to stand, dizziness, new weight loss) "When did you last urinate?"     Lightheaded x 4 days, not new, prior to onset of diarrhea 9. EXPOSURE: "Have you traveled to a foreign country recently?" "Have you been exposed to anyone with diarrhea?" "Could you have eaten any food that was spoiled?"     no 10. ANTIBIOTIC USE: "Are you taking antibiotics now or have you taken antibiotics in the past 2 months?"       no 11. OTHER SYMPTOMS: "Do you have any other symptoms?" (e.g., fever, blood in stool)       Intermittent lightheadedness since Thursday; not spinning, afebrile  Protocols used: DIARRHEA-A-AH

## 2018-06-30 LAB — URINALYSIS, DIPSTICK ONLY
Bilirubin, UA: NEGATIVE
Glucose, UA: NEGATIVE
Ketones, UA: NEGATIVE
LEUKOCYTES UA: NEGATIVE
Nitrite, UA: NEGATIVE
PH UA: 6.5 (ref 5.0–7.5)
Protein, UA: NEGATIVE
RBC, UA: NEGATIVE
Specific Gravity, UA: 1.014 (ref 1.005–1.030)
Urobilinogen, Ur: 0.2 mg/dL (ref 0.2–1.0)

## 2018-07-15 ENCOUNTER — Other Ambulatory Visit: Payer: Self-pay

## 2018-07-15 ENCOUNTER — Encounter: Payer: Self-pay | Admitting: Family Medicine

## 2018-07-15 ENCOUNTER — Ambulatory Visit (INDEPENDENT_AMBULATORY_CARE_PROVIDER_SITE_OTHER): Payer: BLUE CROSS/BLUE SHIELD | Admitting: Family Medicine

## 2018-07-15 VITALS — BP 136/88 | HR 73 | Temp 98.0°F | Ht 61.24 in | Wt 164.2 lb

## 2018-07-15 DIAGNOSIS — I10 Essential (primary) hypertension: Secondary | ICD-10-CM | POA: Diagnosis not present

## 2018-07-15 DIAGNOSIS — Z6833 Body mass index (BMI) 33.0-33.9, adult: Secondary | ICD-10-CM

## 2018-07-15 DIAGNOSIS — E6609 Other obesity due to excess calories: Secondary | ICD-10-CM | POA: Diagnosis not present

## 2018-07-15 DIAGNOSIS — Z1211 Encounter for screening for malignant neoplasm of colon: Secondary | ICD-10-CM

## 2018-07-15 DIAGNOSIS — M48062 Spinal stenosis, lumbar region with neurogenic claudication: Secondary | ICD-10-CM | POA: Diagnosis not present

## 2018-07-15 MED ORDER — GABAPENTIN 300 MG PO CAPS: 300.0000 mg | ORAL_CAPSULE | Freq: Three times a day (TID) | ORAL | 1 refills | Status: DC

## 2018-07-15 NOTE — Patient Instructions (Signed)
° ° ° °  If you have lab work done today you will be contacted with your lab results within the next 2 weeks.  If you have not heard from us then please contact us. The fastest way to get your results is to register for My Chart. ° ° °IF you received an x-ray today, you will receive an invoice from [facility]. Please contact [facility] at [phone] with questions or concerns regarding your invoice.  ° °IF you received labwork today, you will receive an invoice from [facility]. Please contact [facility] at [phone] with questions or concerns regarding your invoice.  ° °Our billing staff will not be able to assist you with questions regarding bills from these companies. ° °You will be contacted with the lab results as soon as they are available. The fastest way to get your results is to activate your My Chart account. Instructions are located on the last page of this paperwork. If you have not heard from us regarding the results in 2 weeks, please contact this office. °  ° ° ° °

## 2018-07-15 NOTE — Progress Notes (Signed)
9/25/20193:46 PM  Lauren Paul 12-13-1965, 52 y.o. female [id number]  Chief Complaint  Patient presents with  . Hypertension    monitors bp almost daily. Numbers are good at home  . Medication Refill    Gabapentin    HPI:   Patient is a 52 y.o. female with past medical history significant for HTN, asthma, OSA, prediabetes, chronic back pain 2/2 spinal stenosis, hot flashes, obesity who presents today to establish care  Previous PCP Dr Tamala Julian Last visit July 2019  OSA - not on cpap, does not tolerate, has tried several weeks  Headache finally resolved, thought to be due to stress  Hot flashes well controlled on lexapro  Back pain doing well on gabapentin, doing piyo  HTN - restarted meds again in aug after having sign headaches, tolerating hctz, not on KCL  Insomnia - takes trazadone as needed  Fall Risk  07/15/2018 05/15/2018 02/28/2018 02/09/2018 02/09/2018  Falls in the past year? No No No No No     Depression screen [facility] 2/9 07/15/2018 05/15/2018 02/28/2018  Decreased Interest 0 0 0  Down, Depressed, Hopeless 0 0 0  PHQ - 2 Score 0 0 0    Allergies  Allergen Reactions  . Bee Venom Anaphylaxis  . Benadryl [Diphenhydramine Hcl] Shortness Of Breath    Pt states she can take Childrens Benadryl dye free fine.  . Diphenhydramine Hcl Anaphylaxis and Swelling  . Doxycycline Swelling  . Latex Swelling  . Penicillins Anaphylaxis    Has patient had a PCN reaction causing immediate rash, facial/tongue/throat swelling, SOB or lightheadedness with hypotension: Yes Has patient had a PCN reaction causing severe rash involving mucus membranes or skin necrosis: No Has patient had a PCN reaction that required hospitalization: Unknown Has patient had a PCN reaction occurring within the last 10 years: No If all of the above answers are "NO", then may proceed with Cephalosporin use.   Marland Kitchen Apap-Pamabrom-Pyrilamine   . Iodides   . Losartan Other (See Comments)    LEG CRAMPS  .  Olmesartan     Leg cramp   . Other     Watermelon- Anaphylaxis Nuts Melon  . Shellfish Allergy   . Sulfa Antibiotics   . Valsartan     Fatigue and alopecia    Prior to Admission medications   Medication Sig Start Date End Date Taking? Authorizing Provider  butalbital-acetaminophen-caffeine (FIORICET, ESGIC) [phone] MG tablet Take 1-2 tablets by mouth every 6 (six) hours as needed for headache. 06/24/18 06/24/19 Yes Mesner, Corene Cornea, MD  cyclobenzaprine (FLEXERIL) 5 MG tablet Take 1-2 tablets (5-10 mg total) by mouth 3 (three) times daily as needed for muscle spasms. 06/29/18  Yes Weber, Sarah L, PA-C  dicyclomine (BENTYL) 10 MG capsule Take 1 capsule (10 mg total) by mouth 4 (four) times daily -  before meals and at bedtime. 06/29/18  Yes Weber, Damaris Hippo, PA-C  EPINEPHrine (EPIPEN 2-PAK) 0.3 mg/0.3 mL IJ SOAJ injection Use as directed for severe allergic reaction 07/21/17  Yes Bardelas, Jose A, MD  escitalopram (LEXAPRO) 20 MG tablet TAKE 1 TABLET BY MOUTH EVERY DAY 04/26/18  Yes Wardell Honour, MD  gabapentin (NEURONTIN) 300 MG capsule Take 300 mg by mouth 3 (three) times daily.  07/10/17  Yes [provider]  hydrochlorothiazide (HYDRODIURIL) 25 MG tablet Take 1 tablet (25 mg total) by mouth daily. 06/24/18  Yes Mesner, Corene Cornea, MD  Multiple Vitamin (MULTIVITAMIN) capsule Take 1 capsule by mouth daily.   Yes [provider]  predniSONE (DELTASONE) 10 MG tablet 8-6-5-4-4-3-3-2-2-1-1 - take dose for the day in the am with food 06/29/18  Yes Weber, Damaris Hippo, PA-C  promethazine (PHENERGAN) 25 MG tablet Take 1-2 tablets (25-50 mg total) by mouth every 8 (eight) hours as needed for nausea or vomiting. 06/29/18  Yes Weber, Sarah L, PA-C  SF 5000 PLUS 1.1 % CREA dental cream Place 1 application onto teeth daily.  04/21/18  Yes [provider]  traZODone (DESYREL) 50 MG tablet Take 1-2 tablets (50-100 mg total) by mouth at bedtime as needed. for sleep 03/23/18  Yes Wardell Honour, MD  VIMOVO  500-20 MG TBEC Take 1 tablet by mouth 2 (two) times daily. Before meals 06/16/17  Yes [provider]    Past Medical History:  Diagnosis Date  . Allergy   . Anemia   . Arthritis   . Asthma   . Headache    migraines  . Hypersomnolence 06/19/2014  . Hypertension   . Meningitis   . Rectal fissure   . Sepsis ([place])   . Ulcer     Past Surgical History:  Procedure Laterality Date  . ABLATION    . DILATION AND CURETTAGE OF UTERUS    . TUBAL LIGATION      Social History   Tobacco Use  . Smoking status: Never Smoker  . Smokeless tobacco: Never Used  Substance Use Topics  . Alcohol use: Yes    Alcohol/week: 2.0 standard drinks    Types: 2 Standard drinks or equivalent per week    Comment: 2-3 glasses of wkly    Family History  Problem Relation Age of Onset  . Bladder Cancer Mother 57  . Uterine cancer Mother   . Cancer Mother 48       Uterine and bladder cancer  . Hypertension Mother   . Multiple sclerosis Mother   . Lung cancer Father   . Cancer Father 40       lung cancer  . Hypertension Sister   . Allergic rhinitis Neg Hx   . Angioedema Neg Hx   . Asthma Neg Hx   . Eczema Neg Hx   . Immunodeficiency Neg Hx   . Urticaria Neg Hx     Review of Systems  Constitutional: Negative for chills and fever.  Respiratory: Negative for cough and shortness of breath.   Cardiovascular: Negative for chest pain, palpitations and leg swelling.  Gastrointestinal: Negative for abdominal pain, nausea and vomiting.     OBJECTIVE:  Blood pressure 136/88, pulse 73, temperature 98 F (36.7 C), temperature source Oral, height 5' 1.24" (1.555 m), weight 164 lb 3.2 oz (74.5 kg), SpO2 98 %. Body mass index is 30.78 kg/m.   Wt Readings from Last 3 Encounters:  07/15/18 164 lb 3.2 oz (74.5 kg)  06/29/18 160 lb 9.6 oz (72.8 kg)  06/24/18 165 lb (74.8 kg)    Physical Exam  Constitutional: She is oriented to person, place, and time. She appears well-developed and  well-nourished.  HENT:  Head: Normocephalic and atraumatic.  Mouth/Throat: Oropharynx is clear and moist. No oropharyngeal exudate.  Eyes: Pupils are equal, round, and reactive to light. Conjunctivae and EOM are normal. No scleral icterus.  Neck: Neck supple.  Cardiovascular: Normal rate, regular rhythm and normal heart sounds. Exam reveals no gallop and no friction rub.  No murmur heard. Pulmonary/Chest: Effort normal and breath sounds normal. She has no wheezes. She has no rales.  Musculoskeletal: She exhibits no edema.  Neurological: She is  alert and oriented to person, place, and time.  Skin: Skin is warm and dry.  Psychiatric: She has a normal mood and affect.  Nursing note and vitals reviewed.   ASSESSMENT and PLAN  1. Essential hypertension, benign Controlled. Continue current regime.  - Basic metabolic panel  2. Colon cancer screening - Cologuard  3. Class 1 obesity due to excess calories with serious comorbidity and body mass index (BMI) of 33.0 to 33.9 in adult Discussed LFM, restart exercise. Work on diet.  4. Spinal stenosis of lumbar region with neurogenic claudication Controlled. Continue current regime.   Other orders - gabapentin (NEURONTIN) 300 MG capsule; Take 1 capsule (300 mg total) by mouth 3 (three) times daily.  Return in about 6 months (around 01/13/2019).    Rutherford Guys, MD [facility] [place], [place] [address] Ph.  [phone] Fax [phone]

## 2018-07-16 LAB — BASIC METABOLIC PANEL
BUN/Creatinine Ratio: 14 (ref 9–23)
BUN: 15 mg/dL (ref 6–24)
CO2: 22 mmol/L (ref 20–29)
Calcium: 10 mg/dL (ref 8.7–10.2)
Chloride: 100 mmol/L (ref 96–106)
Creatinine, Ser: 1.05 mg/dL — ABNORMAL HIGH (ref 0.57–1.00)
GFR calc Af Amer: 71 mL/min/{1.73_m2} (ref >59)
GFR calc non Af Amer: 62 mL/min/{1.73_m2} (ref >59)
Glucose: 90 mg/dL (ref 65–99)
Potassium: 3.6 mmol/L (ref 3.5–5.2)
Sodium: 141 mmol/L (ref 134–144)

## 2018-08-14 ENCOUNTER — Ambulatory Visit: Payer: BLUE CROSS/BLUE SHIELD | Admitting: Family Medicine

## 2018-09-08 ENCOUNTER — Encounter: Payer: Self-pay | Admitting: Family Medicine

## 2018-09-09 ENCOUNTER — Encounter: Payer: Self-pay | Admitting: Family Medicine

## 2018-09-15 NOTE — Telephone Encounter (Signed)
Paperwork completed today Patient notified

## 2018-09-22 ENCOUNTER — Encounter: Payer: Self-pay | Admitting: Family Medicine

## 2018-09-24 ENCOUNTER — Encounter: Payer: Self-pay | Admitting: Family Medicine

## 2018-09-25 DIAGNOSIS — Z0271 Encounter for disability determination: Secondary | ICD-10-CM

## 2018-10-10 ENCOUNTER — Other Ambulatory Visit: Payer: Self-pay | Admitting: Family Medicine

## 2018-10-12 NOTE — Telephone Encounter (Signed)
Requesting one medication only MD can send

## 2018-10-12 NOTE — Telephone Encounter (Signed)
Please review meds. Both are prescribed by K. Smith.  Hydrochlorothiazide 12.5 mg has been discontinued. Lexapro 20 mg. LR 04/26/18 for 90 tabs.  Provider Dr. Pamella Pert

## 2019-01-04 ENCOUNTER — Other Ambulatory Visit: Payer: Self-pay | Admitting: Family Medicine

## 2019-02-03 ENCOUNTER — Ambulatory Visit: Payer: Self-pay

## 2019-02-03 NOTE — Telephone Encounter (Signed)
Pt states she was seen by Ortho who removed stitches.  Had to return to ED where stitches were replaced.  States that tendons are visible and feels like her nerves are on fire; concerned for infection.  Has not updated Ortho [facility]) on status of wound for 2 days.  Referred back to Ortho as ideally same provider follows wound.  Advised that if she is not able to get in with Ortho, will check availability but strongly encouraged to notify Ortho of concerns on wound as they are following it.  Pt verbalizes understanding.

## 2019-02-03 NOTE — Telephone Encounter (Signed)
Patient called and says that she has a laceration to her left ankle that happened on 01/17/19 and it is red, was swollen, still oozing. She says she's was being seen by orthopedics and was place on antibiotics, Clindamycin, on Monday. She says the redness started yesterday. She says it's painful to walk on, a 7-8 and a 2-3 without walking. She denies fever. She says she would like Dr. Nelle Don to look at it. I called the office and spoke to [place], [facility] [place] who connected me to [place], [place]. She asks to speak to the patient, the call was connected.   Answer Assessment - Initial Assessment Questions 1. LOCATION: "Where is the wound located?"      Left ankle 2. WOUND APPEARANCE: "What does the wound look like?"      Stitches with opening 3. SIZE: If redness is present, ask: "What is the size of the red area?" (Inches, centimeters, or compare to size of a coin)      Redness all the way the length of the wound 4. SPREAD: "What's changed in the last day?"  "Do you see any red streaks coming from the wound?"     Doesn't look "happy", red 5. ONSET: "When did it start to look infected?"      Yesterday 6. MECHANISM: "How did the wound start, what was the cause?"     Laceration on 01/17/19 7. PAIN: "Is there any pain?" If so, ask: "How bad is the pain?"   (Scale 1-10; or mild, moderate, severe)     Yes, I can barely walk. Not walking a 2-3; walking 7-8 8. FEVER: "Do you have a fever?" If so, ask: "What is your temperature, how was it measured, and when did it start?"     No 9. OTHER SYMPTOMS: "Do you have any other symptoms?" (e.g., shaking chills, weakness, rash elsewhere on body)     Upset stomach from the antibiotic-clindamycin 10. PREGNANCY: "Is there any chance you are pregnant?" "When was your last menstrual period?"      No  Protocols used: WOUND INFECTION-A-AH

## 2019-03-29 ENCOUNTER — Other Ambulatory Visit: Payer: Self-pay | Admitting: Family Medicine

## 2019-03-29 NOTE — Telephone Encounter (Signed)
Requested medication (s) are due for refill today: Yes  Requested medication (s) are on the active medication list: Yes  Last refill:  12/25/18  Future visit scheduled: Yes  Notes to clinic:  Overdue for PHQ-9, unable to refill     Requested Prescriptions  Pending Prescriptions Disp Refills   escitalopram (LEXAPRO) 20 MG tablet [Pharmacy Med Name: ESCITALOPRAM 20 MG TABLET] 90 tablet 0    Sig: TAKE 1 TABLET BY MOUTH EVERY DAY     Psychiatry:  Antidepressants - SSRI Failed - 03/29/2019 10:50 AM      Failed - Valid encounter within last 6 months    Recent Outpatient Visits          8 months ago Essential hypertension, benign   [facility], Lilia Argue, MD   9 months ago Lightheaded   [facility], Damaris Hippo, PA-C   10 months ago Other chest pain   [facility] at [facility], Renette Butters, MD   1 year ago Skin tag   [facility] at [facility], Renette Butters, MD   1 year ago Routine physical examination   Primary Care at [facility], Renette Butters, MD      Future Appointments            In 2 days Rutherford Guys, MD Primary Care at [address], [facility]           Failed - Completed PHQ-2 or PHQ-9 in the last 360 days.

## 2019-03-29 NOTE — Telephone Encounter (Signed)
Patient called and advised she's overdue for a 6 month follow up visit and that will need to be scheduled to continue to receive medication refills, she verbalized understanding. I called the office and spoke to [place], [facility] who asked to speak to the patient, the call was connected successfully.

## 2019-03-31 ENCOUNTER — Other Ambulatory Visit: Payer: Self-pay

## 2019-03-31 ENCOUNTER — Ambulatory Visit (INDEPENDENT_AMBULATORY_CARE_PROVIDER_SITE_OTHER): Payer: BC Managed Care – PPO | Admitting: Family Medicine

## 2019-03-31 ENCOUNTER — Encounter: Payer: Self-pay | Admitting: Family Medicine

## 2019-03-31 VITALS — BP 128/88 | HR 63 | Temp 98.5°F | Ht 61.24 in | Wt 165.0 lb

## 2019-03-31 DIAGNOSIS — R7309 Other abnormal glucose: Secondary | ICD-10-CM | POA: Diagnosis not present

## 2019-03-31 DIAGNOSIS — Z0000 Encounter for general adult medical examination without abnormal findings: Secondary | ICD-10-CM

## 2019-03-31 DIAGNOSIS — I10 Essential (primary) hypertension: Secondary | ICD-10-CM | POA: Diagnosis not present

## 2019-03-31 DIAGNOSIS — N951 Menopausal and female climacteric states: Secondary | ICD-10-CM | POA: Diagnosis not present

## 2019-03-31 DIAGNOSIS — G4701 Insomnia due to medical condition: Secondary | ICD-10-CM

## 2019-03-31 DIAGNOSIS — Z1239 Encounter for other screening for malignant neoplasm of breast: Secondary | ICD-10-CM

## 2019-03-31 DIAGNOSIS — Z0001 Encounter for general adult medical examination with abnormal findings: Secondary | ICD-10-CM

## 2019-03-31 DIAGNOSIS — E7800 Pure hypercholesterolemia, unspecified: Secondary | ICD-10-CM

## 2019-03-31 MED ORDER — ZOSTER VAC RECOMB ADJUVANTED 50 MCG/0.5ML IM SUSR: 0.5000 mL | Freq: Once | INTRAMUSCULAR | 1 refills | Status: AC

## 2019-03-31 MED ORDER — TRAZODONE HCL 50 MG PO TABS: 50.0000 mg | ORAL_TABLET | Freq: Every evening | ORAL | 3 refills | Status: DC | PRN

## 2019-03-31 MED ORDER — ESCITALOPRAM OXALATE 20 MG PO TABS: 20.0000 mg | ORAL_TABLET | Freq: Every day | ORAL | 3 refills | Status: DC

## 2019-03-31 MED ORDER — HYDROCHLOROTHIAZIDE 12.5 MG PO TABS: 12.5000 mg | ORAL_TABLET | Freq: Every day | ORAL | 3 refills | Status: DC

## 2019-03-31 NOTE — Progress Notes (Signed)
6/10/20204:53 PM  Lauren Paul [birth year], 53 y.o., female [id number]  Chief Complaint  Patient presents with  . Hypertension  . Annual Exam    HPI:   Patient is a 53 y.o. female with past medical history significant for HTN, asthma, OSA, prediabetes, chronic back pain 2/2 spinal stenosis, hot flashes, obesity who presents today for routine followup and CPE  Last OV Sept 2019 Last CPE several years ago  Has left foot laceration with complications - ortho recently released her from their care  Forgot to do cologuard, still has kit at home  lexapro and trazaodone working well, takes for hot flashes and insomnia, has OSA, intolerant of cpap  Had eye exam 2 weeks - no changes She is [place] Suzie Portela in [place] Sees dentist twice a year Has gained weight since injury to left ankle. Planning on starting to walk again. Cant do piyo yet  Most Recent Immunizations  Administered Date(s) Administered  . Tdap 01/17/2019    Fall Risk  03/31/2019 07/15/2018 05/15/2018 02/28/2018 02/09/2018  Falls in the past year? 1 No No No No  Number falls in past yr: 0 - - - -  Injury with Fall? 1 - - - -     Depression screen [facility] 2/9 03/31/2019 07/15/2018 05/15/2018  Decreased Interest 0 0 0  Down, Depressed, Hopeless 0 0 0  PHQ - 2 Score 0 0 0    Allergies  Allergen Reactions  . Bee Venom Anaphylaxis  . Benadryl [Diphenhydramine Hcl] Shortness Of Breath    Pt states she can take Childrens Benadryl dye free fine.  . Diphenhydramine Hcl Anaphylaxis and Swelling  . Doxycycline Swelling  . Latex Swelling  . Peanut (Diagnostic) Anaphylaxis, Hives, Itching and Rash  . Penicillins Anaphylaxis    Has patient had a PCN reaction causing immediate rash, facial/tongue/throat swelling, SOB or lightheadedness with hypotension: Yes Has patient had a PCN reaction causing severe rash involving mucus membranes or skin necrosis: No Has patient had a PCN reaction that  required hospitalization: Unknown Has patient had a PCN reaction occurring within the last 10 years: No If all of the above answers are "NO", then may proceed with Cephalosporin use.   Marland Kitchen Apap-Pamabrom-Pyrilamine   . Iodides   . Losartan Other (See Comments)    LEG CRAMPS  . Olmesartan     Leg cramp   . Other     Watermelon- Anaphylaxis Nuts Melon  . Shellfish Allergy   . Sulfa Antibiotics   . Valsartan     Fatigue and alopecia    Prior to Admission medications   Medication Sig Start Date End Date Taking? Authorizing Provider  EPINEPHrine (EPIPEN 2-PAK) 0.3 mg/0.3 mL IJ SOAJ injection Use as directed for severe allergic reaction 07/21/17  Yes Bardelas, Jose A, MD  escitalopram (LEXAPRO) 20 MG tablet TAKE 1 TABLET BY MOUTH EVERY DAY 01/04/19  Yes Rutherford Guys, MD  hydrochlorothiazide (HYDRODIURIL) 12.5 MG tablet TAKE 1 TABLET BY MOUTH EVERY DAY 10/12/18  Yes Rutherford Guys, MD  hydrochlorothiazide (HYDRODIURIL) 25 MG tablet Take 1 tablet (25 mg total) by mouth daily. 06/24/18  Yes Mesner, Corene Cornea, MD  SF 5000 PLUS 1.1 % CREA dental cream Place 1 application onto teeth daily.  04/21/18  Yes [provider]  traZODone (DESYREL) 50 MG tablet Take 1-2 tablets (50-100 mg total) by mouth at bedtime as needed. for sleep 03/23/18  Yes Wardell Honour, MD  gabapentin (NEURONTIN) 300  MG capsule Take 1 capsule (300 mg total) by mouth 3 (three) times daily. 07/15/18   Rutherford Guys, MD    Past Medical History:  Diagnosis Date  . Allergy   . Anemia   . Arthritis   . Asthma   . Headache    migraines  . Hypersomnolence 06/19/2014  . Hypertension   . Meningitis   . Rectal fissure   . Sepsis ([place])   . Ulcer     Past Surgical History:  Procedure Laterality Date  . ABLATION    . DILATION AND CURETTAGE OF UTERUS    . TUBAL LIGATION      Social History   Tobacco Use  . Smoking status: Never Smoker  . Smokeless tobacco: Never Used  Substance Use Topics  . Alcohol use: Yes     Alcohol/week: 2.0 standard drinks    Types: 2 Standard drinks or equivalent per week    Comment: 2-3 glasses of wkly    Family History  Problem Relation Age of Onset  . Bladder Cancer Mother 11  . Uterine cancer Mother   . Cancer Mother 35       Uterine and bladder cancer  . Hypertension Mother   . Multiple sclerosis Mother   . Lung cancer Father   . Cancer Father 35       lung cancer  . Hypertension Sister   . Allergic rhinitis Neg Hx   . Angioedema Neg Hx   . Asthma Neg Hx   . Eczema Neg Hx   . Immunodeficiency Neg Hx   . Urticaria Neg Hx     Review of Systems  Constitutional: Negative for chills and fever.  Respiratory: Negative for cough and shortness of breath.   Cardiovascular: Negative for chest pain, palpitations and leg swelling.  Gastrointestinal: Negative for abdominal pain, blood in stool, constipation, diarrhea, heartburn, melena, nausea and vomiting.  Genitourinary: Negative for dysuria, frequency, hematuria and urgency.  Musculoskeletal: Positive for joint pain (left ankle).  Neurological: Positive for tingling. Negative for dizziness and focal weakness.  Psychiatric/Behavioral: Negative for depression. The patient has insomnia. The patient is not nervous/anxious.   All other systems reviewed and are negative.    OBJECTIVE:  Today's Vitals   03/31/19 1634  BP: 128/88  Pulse: 63  Temp: 98.5 F (36.9 C)  TempSrc: Oral  SpO2: 100%  Weight: 165 lb (74.8 kg)  Height: 5' 1.24" (1.555 m)   Body mass index is 30.93 kg/m.    Physical Exam Vitals signs and nursing note reviewed.  Constitutional:      General: She is not in acute distress.    Appearance: She is well-developed.  HENT:     Head: Normocephalic and atraumatic.     Right Ear: Hearing, tympanic membrane, ear canal and external ear normal.     Left Ear: Hearing, tympanic membrane, ear canal and external ear normal.     Mouth/Throat:     Mouth: Mucous membranes are moist.     Pharynx:  No oropharyngeal exudate or posterior oropharyngeal erythema.  Eyes:     Extraocular Movements: Extraocular movements intact.     Conjunctiva/sclera: Conjunctivae normal.     Pupils: Pupils are equal, round, and reactive to light.  Neck:     Musculoskeletal: Neck supple.     Thyroid: No thyromegaly.  Cardiovascular:     Rate and Rhythm: Normal rate and regular rhythm.     Heart sounds: Normal heart sounds. No murmur. No friction rub. No  gallop.   Pulmonary:     Effort: Pulmonary effort is normal.     Breath sounds: Normal breath sounds. No wheezing, rhonchi or rales.  Abdominal:     General: Bowel sounds are normal. There is no distension.     Palpations: Abdomen is soft. There is no hepatomegaly, splenomegaly or mass.     Tenderness: There is no abdominal tenderness.  Musculoskeletal: Normal range of motion.     Right lower leg: No edema.     Left lower leg: No edema.  Lymphadenopathy:     Cervical: No cervical adenopathy.  Skin:    General: Skin is warm and dry.  Neurological:     Mental Status: She is alert and oriented to person, place, and time.     Cranial Nerves: No cranial nerve deficit.     Gait: Gait normal.     Deep Tendon Reflexes: Reflexes are normal and symmetric.  Psychiatric:        Mood and Affect: Mood normal.        Behavior: Behavior normal.     ASSESSMENT and PLAN  1. Annual physical exam No concerns per history or exam. Routine HCM labs ordered. HCM reviewed/discussed. Anticipatory guidance regarding healthy weight, lifestyle and choices given. Encouraged her to collect and send in cologuard.  2. Encounter for breast cancer screening other than mammogram - MM DIGITAL SCREENING BILATERAL; Future  3. Essential hypertension, benign Controlled. Continue current regime.  - Lipid panel - TSH - CMP14+EGFR  4. Pure hypercholesterolemia Checking labs today, continue with LFM - Lipid panel - TSH - CMP14+EGFR  5. High glucose level Checking labs  today, continue with LFM - Hemoglobin A1c  6. Menopausal hot flushes Controlled. Continue current regime.   7. Insomnia due to medical condition Controlled. Continue current regime.   Other orders - traZODone (DESYREL) 50 MG tablet; Take 1-2 tablets (50-100 mg total) by mouth at bedtime as needed. for sleep - hydrochlorothiazide (HYDRODIURIL) 12.5 MG tablet; Take 1 tablet (12.5 mg total) by mouth daily. - escitalopram (LEXAPRO) 20 MG tablet; Take 1 tablet (20 mg total) by mouth daily. - Zoster Vaccine Adjuvanted [facility]) injection; Inject 0.5 mLs into the muscle once for 1 dose.  Return in about 6 months (around 09/30/2019).    Rutherford Guys, MD Primary Care at [facility], [place] [address] Ph.  [phone] Fax [phone]

## 2019-03-31 NOTE — Patient Instructions (Signed)
Preventive Care 40-64 Years, Female Preventive care refers to lifestyle choices and visits with your health care provider that can promote health and wellness. What does preventive care include?   A yearly physical exam. This is also called an annual well check.  Dental exams once or twice a year.  Routine eye exams. Ask your health care provider how often you should have your eyes checked.  Personal lifestyle choices, including: ? Daily care of your teeth and gums. ? Regular physical activity. ? Eating a healthy diet. ? Avoiding tobacco and drug use. ? Limiting alcohol use. ? Practicing safe sex. ? Taking low-dose aspirin daily starting at age 50. ? Taking vitamin and mineral supplements as recommended by your health care provider. What happens during an annual well check? The services and screenings done by your health care provider during your annual well check will depend on your age, overall health, lifestyle risk factors, and family history of disease. Counseling Your health care provider may ask you questions about your:  Alcohol use.  Tobacco use.  Drug use.  Emotional well-being.  Home and relationship well-being.  Sexual activity.  Eating habits.  Work and work environment.  Method of birth control.  Menstrual cycle.  Pregnancy history. Screening You may have the following tests or measurements:  Height, weight, and BMI.  Blood pressure.  Lipid and cholesterol levels. These may be checked every 5 years, or more frequently if you are over 50 years old.  Skin check.  Lung cancer screening. You may have this screening every year starting at age 55 if you have a 30-pack-year history of smoking and currently smoke or have quit within the past 15 years.  Colorectal cancer screening. All adults should have this screening starting at age 50 and continuing until age 75. Your health care provider may recommend screening at age 45. You will have tests every  1-10 years, depending on your results and the type of screening test. People at increased risk should start screening at an earlier age. Screening tests may include: ? Guaiac-based fecal occult blood testing. ? Fecal immunochemical test (FIT). ? Stool DNA test. ? Virtual colonoscopy. ? Sigmoidoscopy. During this test, a flexible tube with a tiny camera (sigmoidoscope) is used to examine your rectum and lower colon. The sigmoidoscope is inserted through your anus into your rectum and lower colon. ? Colonoscopy. During this test, a long, thin, flexible tube with a tiny camera (colonoscope) is used to examine your entire colon and rectum.  Hepatitis C blood test.  Hepatitis B blood test.  Sexually transmitted disease (STD) testing.  Diabetes screening. This is done by checking your blood sugar (glucose) after you have not eaten for a while (fasting). You may have this done every 1-3 years.  Mammogram. This may be done every 1-2 years. Talk to your health care provider about when you should start having regular mammograms. This may depend on whether you have a family history of breast cancer.  BRCA-related cancer screening. This may be done if you have a family history of breast, ovarian, tubal, or peritoneal cancers.  Pelvic exam and Pap test. This may be done every 3 years starting at age 21. Starting at age 30, this may be done every 5 years if you have a Pap test in combination with an HPV test.  Bone density scan. This is done to screen for osteoporosis. You may have this scan if you are at high risk for osteoporosis. Discuss your test results, treatment options,   and if necessary, the need for more tests with your health care provider. Vaccines Your health care provider may recommend certain vaccines, such as:  Influenza vaccine. This is recommended every year.  Tetanus, diphtheria, and acellular pertussis (Tdap, Td) vaccine. You may need a Td booster every 10 years.  Varicella  vaccine. You may need this if you have not been vaccinated.  Zoster vaccine. You may need this after age 38.  Measles, mumps, and rubella (MMR) vaccine. You may need at least one dose of MMR if you were born in 1957 or later. You may also need a second dose.  Pneumococcal 13-valent conjugate (PCV13) vaccine. You may need this if you have certain conditions and were not previously vaccinated.  Pneumococcal polysaccharide (PPSV23) vaccine. You may need one or two doses if you smoke cigarettes or if you have certain conditions.  Meningococcal vaccine. You may need this if you have certain conditions.  Hepatitis A vaccine. You may need this if you have certain conditions or if you travel or work in places where you may be exposed to hepatitis A.  Hepatitis B vaccine. You may need this if you have certain conditions or if you travel or work in places where you may be exposed to hepatitis B.  Haemophilus influenzae type b (Hib) vaccine. You may need this if you have certain conditions. Talk to your health care provider about which screenings and vaccines you need and how often you need them. This information is not intended to replace advice given to you by your health care provider. Make sure you discuss any questions you have with your health care provider. Document Released: 11/03/2015 Document Revised: 11/27/2017 Document Reviewed: 08/08/2015 Elsevier Interactive Patient Education  2019 [employer].

## 2019-04-01 LAB — CMP14+EGFR
ALT: 11 IU/L (ref 0–32)
AST: 17 IU/L (ref 0–40)
Albumin/Globulin Ratio: 1.7 (ref 1.2–2.2)
Albumin: 4.5 g/dL (ref 3.8–4.9)
Alkaline Phosphatase: 85 IU/L (ref 39–117)
BUN/Creatinine Ratio: 21 (ref 9–23)
BUN: 16 mg/dL (ref 6–24)
Bilirubin Total: 0.5 mg/dL (ref 0.0–1.2)
CO2: 23 mmol/L (ref 20–29)
Calcium: 9.9 mg/dL (ref 8.7–10.2)
Chloride: 102 mmol/L (ref 96–106)
Creatinine, Ser: 0.77 mg/dL (ref 0.57–1.00)
GFR calc Af Amer: 103 mL/min/{1.73_m2} (ref >59)
GFR calc non Af Amer: 90 mL/min/{1.73_m2} (ref >59)
Globulin, Total: 2.6 g/dL (ref 1.5–4.5)
Glucose: 92 mg/dL (ref 65–99)
Potassium: 3.7 mmol/L (ref 3.5–5.2)
Sodium: 141 mmol/L (ref 134–144)
Total Protein: 7.1 g/dL (ref 6.0–8.5)

## 2019-04-01 LAB — LIPID PANEL
Chol/HDL Ratio: 3.6 ratio (ref 0.0–4.4)
Cholesterol, Total: 189 mg/dL (ref 100–199)
HDL: 53 mg/dL (ref >39)
LDL Calculated: 106 mg/dL — ABNORMAL HIGH (ref 0–99)
Triglycerides: 150 mg/dL — ABNORMAL HIGH (ref 0–149)
VLDL Cholesterol Cal: 30 mg/dL (ref 5–40)

## 2019-04-01 LAB — HEMOGLOBIN A1C
Est. average glucose Bld gHb Est-mCnc: 111 mg/dL
Hgb A1c MFr Bld: 5.5 % (ref 4.8–5.6)

## 2019-04-01 LAB — TSH: TSH: 0.833 u[IU]/mL (ref 0.450–4.500)

## 2019-04-30 ENCOUNTER — Telehealth: Payer: Self-pay | Admitting: Family Medicine

## 2019-04-30 NOTE — Telephone Encounter (Signed)
Spoke to patient stated she could not wait until Monday for an appointment would go to the ED

## 2019-04-30 NOTE — Telephone Encounter (Signed)
Pt called and is requesting a muscle relaxer for a migraine she is starting to feel. Pt was advised she may need an appt. Please advise.    [id number] - [place],  - [address] AT [address]OSS [address]  [address] [place] [address]  Phone: [phone] Fax: [phone]  Not a 24 hour pharmacy; exact hours not known.

## 2019-04-30 NOTE — Telephone Encounter (Signed)
Please schedule pt an appointment  

## 2019-07-01 ENCOUNTER — Other Ambulatory Visit: Payer: Self-pay | Admitting: Orthopedic Surgery

## 2019-07-01 DIAGNOSIS — M5416 Radiculopathy, lumbar region: Secondary | ICD-10-CM

## 2019-07-06 ENCOUNTER — Other Ambulatory Visit: Payer: Self-pay

## 2019-07-06 ENCOUNTER — Ambulatory Visit
Admission: RE | Admit: 2019-07-06 | Discharge: 2019-07-06 | Disposition: A | Discharge status: Home / Self Care | Payer: BC Managed Care – PPO | Source: Ambulatory Visit | Attending: Orthopedic Surgery | Admitting: Orthopedic Surgery

## 2019-07-06 DIAGNOSIS — M5416 Radiculopathy, lumbar region: Secondary | ICD-10-CM

## 2019-07-06 MED ORDER — IOPAMIDOL (ISOVUE-M 200) INJECTION 41%
1.0000 mL | Freq: Once | INTRAMUSCULAR | Status: AC
  Administered 2019-07-06: 1 mL via EPIDURAL

## 2019-07-06 MED ORDER — METHYLPREDNISOLONE ACETATE 40 MG/ML INJ SUSP (RADIOLOG
120.0000 mg | Freq: Once | INTRAMUSCULAR | Status: AC
  Administered 2019-07-06: 120 mg via EPIDURAL

## 2019-07-06 NOTE — Discharge Instructions (Signed)

## 2019-09-27 ENCOUNTER — Ambulatory Visit: Payer: BC Managed Care – PPO | Admitting: Family Medicine

## 2019-10-04 ENCOUNTER — Ambulatory Visit (INDEPENDENT_AMBULATORY_CARE_PROVIDER_SITE_OTHER): Payer: BC Managed Care – PPO | Admitting: Family Medicine

## 2019-10-04 ENCOUNTER — Other Ambulatory Visit: Payer: Self-pay

## 2019-10-04 ENCOUNTER — Encounter: Payer: Self-pay | Admitting: Family Medicine

## 2019-10-04 VITALS — BP 134/86 | HR 90 | Temp 98.2°F | Ht 61.24 in | Wt 164.8 lb

## 2019-10-04 DIAGNOSIS — E7800 Pure hypercholesterolemia, unspecified: Secondary | ICD-10-CM

## 2019-10-04 DIAGNOSIS — R7302 Impaired glucose tolerance (oral): Secondary | ICD-10-CM

## 2019-10-04 DIAGNOSIS — R3915 Urgency of urination: Secondary | ICD-10-CM | POA: Diagnosis not present

## 2019-10-04 DIAGNOSIS — L230 Allergic contact dermatitis due to metals: Secondary | ICD-10-CM

## 2019-10-04 DIAGNOSIS — Z6830 Body mass index (BMI) 30.0-30.9, adult: Secondary | ICD-10-CM

## 2019-10-04 DIAGNOSIS — I10 Essential (primary) hypertension: Secondary | ICD-10-CM | POA: Diagnosis not present

## 2019-10-04 LAB — POCT URINALYSIS DIP (MANUAL ENTRY)
Bilirubin, UA: NEGATIVE
Blood, UA: NEGATIVE
Glucose, UA: NEGATIVE mg/dL
Ketones, POC UA: NEGATIVE mg/dL
Leukocytes, UA: NEGATIVE
Nitrite, UA: NEGATIVE
Protein Ur, POC: NEGATIVE mg/dL
Spec Grav, UA: >=1.03 — AB (ref 1.010–1.025)
Urobilinogen, UA: 0.2 E.U./dL
pH, UA: 6 (ref 5.0–8.0)

## 2019-10-04 LAB — POCT GLYCOSYLATED HEMOGLOBIN (HGB A1C): Hemoglobin A1C: 5.7 % — AB (ref 4.0–5.6)

## 2019-10-04 MED ORDER — TRIAMCINOLONE ACETONIDE 0.1 % EX CREA: 1.0000 "application " | TOPICAL_CREAM | Freq: Two times a day (BID) | CUTANEOUS | 1 refills | Status: DC

## 2019-10-04 NOTE — Patient Instructions (Signed)
° ° ° °  If you have lab work done today you will be contacted with your lab results within the next 2 weeks.  If you have not heard from us then please contact us. The fastest way to get your results is to register for My Chart. ° ° °IF you received an x-ray today, you will receive an invoice from [facility]. Please contact [facility] at [phone] with questions or concerns regarding your invoice.  ° °IF you received labwork today, you will receive an invoice from [facility]. Please contact [facility] at [phone] with questions or concerns regarding your invoice.  ° °Our billing staff will not be able to assist you with questions regarding bills from these companies. ° °You will be contacted with the lab results as soon as they are available. The fastest way to get your results is to activate your My Chart account. Instructions are located on the last page of this paperwork. If you have not heard from us regarding the results in 2 weeks, please contact this office. °  ° ° ° °

## 2019-10-04 NOTE — Progress Notes (Signed)
12/14/20203:27 PM  Lauren Paul 11-20-1965, 53 y.o., female [id number]  Chief Complaint  Patient presents with  . Follow-up    wants to be chked for dm, having urgency and thirst all the time along with vaginal itching    HPI:   Patient is a 53 y.o. female with past medical history significant for HTN, asthma, OSA, prediabetes, chronic back pain 2/2 spinal stenosis, hot flashes, obesitywho presents todayfor routine followup    Having rash on wrist were she wears her watch Also having increased urination and thirst with groin irtchiness for the past month Has been doing keto diet, has only lost 5 lbs Having sign dental issues 2/2 valsartan per her dentist.   Depression screen [facility] 2/9 10/04/2019 03/31/2019 07/15/2018  Decreased Interest 0 0 0  Down, Depressed, Hopeless 0 0 0  PHQ - 2 Score 0 0 0    Fall Risk  10/04/2019 03/31/2019 07/15/2018 05/15/2018 02/28/2018  Falls in the past year? 0 1 No No No  Number falls in past yr: 0 0 - - -  Injury with Fall? 0 1 - - -     Allergies  Allergen Reactions  . Bee Venom Anaphylaxis  . Benadryl [Diphenhydramine Hcl] Shortness Of Breath    Pt states she can take Childrens Benadryl dye free fine.  . Diphenhydramine Hcl Anaphylaxis and Swelling  . Doxycycline Swelling  . Latex Swelling  . Peanut (Diagnostic) Anaphylaxis, Hives, Itching and Rash  . Penicillins Anaphylaxis    Has patient had a PCN reaction causing immediate rash, facial/tongue/throat swelling, SOB or lightheadedness with hypotension: Yes Has patient had a PCN reaction causing severe rash involving mucus membranes or skin necrosis: No Has patient had a PCN reaction that required hospitalization: Unknown Has patient had a PCN reaction occurring within the last 10 years: No If all of the above answers are "NO", then may proceed with Cephalosporin use.   Marland Kitchen Apap-Pamabrom-Pyrilamine   . Betadine [Povidone Iodine]   . Iodides   . Losartan Other (See Comments)    LEG CRAMPS   . Olmesartan     Leg cramp   . Other     Watermelon- Anaphylaxis Nuts Melon  . Shellfish Allergy   . Sulfa Antibiotics   . Valsartan     Fatigue and alopecia    Prior to Admission medications   Medication Sig Start Date End Date Taking? Authorizing Provider  EPINEPHrine (EPIPEN 2-PAK) 0.3 mg/0.3 mL IJ SOAJ injection Use as directed for severe allergic reaction 07/21/17  Yes Bardelas, Jose A, MD  escitalopram (LEXAPRO) 20 MG tablet Take 1 tablet (20 mg total) by mouth daily. 03/31/19  Yes Rutherford Guys, MD  hydrochlorothiazide (HYDRODIURIL) 12.5 MG tablet Take 1 tablet (12.5 mg total) by mouth daily. 03/31/19  Yes Rutherford Guys, MD  SF 5000 PLUS 1.1 % CREA dental cream Place 1 application onto teeth daily.  04/21/18  Yes [provider]  traZODone (DESYREL) 50 MG tablet Take 1-2 tablets (50-100 mg total) by mouth at bedtime as needed. for sleep 03/31/19  Yes Rutherford Guys, MD    Past Medical History:  Diagnosis Date  . Allergy   . Anemia   . Arthritis   . Asthma   . Headache    migraines  . Hypersomnolence 06/19/2014  . Hypertension   . Meningitis   . Rectal fissure   . Sepsis ([place])   . Ulcer     Past Surgical History:  Procedure Laterality Date  .  ABLATION    . DILATION AND CURETTAGE OF UTERUS    . TUBAL LIGATION      Social History   Tobacco Use  . Smoking status: Never Smoker  . Smokeless tobacco: Never Used  Substance Use Topics  . Alcohol use: Yes    Alcohol/week: 2.0 standard drinks    Types: 2 Standard drinks or equivalent per week    Comment: 2-3 glasses of wkly    Family History  Problem Relation Age of Onset  . Bladder Cancer Mother 78  . Uterine cancer Mother   . Cancer Mother 29       Uterine and bladder cancer  . Hypertension Mother   . Multiple sclerosis Mother   . Lung cancer Father   . Cancer Father 70       lung cancer  . Hypertension Sister   . Allergic rhinitis Neg Hx   . Angioedema Neg Hx   . Asthma Neg Hx   .  Eczema Neg Hx   . Immunodeficiency Neg Hx   . Urticaria Neg Hx     Review of Systems  Constitutional: Negative for chills and fever.  Respiratory: Negative for cough and shortness of breath.   Cardiovascular: Negative for chest pain, palpitations and leg swelling.  Gastrointestinal: Negative for abdominal pain, nausea and vomiting.     OBJECTIVE:  Today's Vitals   10/04/19 1524  BP: 134/86  Pulse: 90  Temp: 98.2 F (36.8 C)  SpO2: 98%  Weight: 164 lb 12.8 oz (74.8 kg)  Height: 5' 1.24" (1.555 m)   Body mass index is 30.9 kg/m.   Physical Exam Vitals and nursing note reviewed.  Constitutional:      Appearance: She is well-developed.  HENT:     Head: Normocephalic and atraumatic.     Mouth/Throat:     Pharynx: No oropharyngeal exudate.  Eyes:     General: No scleral icterus.    Conjunctiva/sclera: Conjunctivae normal.     Pupils: Pupils are equal, round, and reactive to light.  Cardiovascular:     Rate and Rhythm: Normal rate and regular rhythm.     Heart sounds: Normal heart sounds. No murmur. No friction rub. No gallop.   Pulmonary:     Effort: Pulmonary effort is normal.     Breath sounds: Normal breath sounds. No wheezing or rales.  Musculoskeletal:     Cervical back: Neck supple.  Skin:    General: Skin is warm and dry.  Neurological:     Mental Status: She is alert and oriented to person, place, and time.     Results for orders placed or performed in visit on 10/04/19 (from the past 24 hour(s))  POCT urine dipstick     Status: Abnormal   Collection Time: 10/04/19  3:32 PM  Result Value Ref Range   Color, UA yellow yellow   Clarity, UA clear clear   Glucose, UA negative negative mg/dL   Bilirubin, UA negative negative   Ketones, POC UA negative negative mg/dL   Spec Grav, UA >=1.030 (A) 1.010 - 1.025   Blood, UA negative negative   pH, UA 6.0 5.0 - 8.0   Protein Ur, POC negative negative mg/dL   Urobilinogen, UA 0.2 0.2 or 1.0 E.U./dL    Nitrite, UA Negative Negative   Leukocytes, UA Negative Negative  POCT A1C     Status: Abnormal   Collection Time: 10/04/19  3:56 PM  Result Value Ref Range   Hemoglobin A1C 5.7 (A) 4.0 -  5.6 %   HbA1c POC (<> result, manual entry)     HbA1c, POC (prediabetic range)     HbA1c, POC (controlled diabetic range)      No results found.   ASSESSMENT and PLAN  1. Urinary urgency Discussed supportive measures - POCT urine dipstick - no infection nor glucose.   2. Essential hypertension, benign Controlled. Continue current regime.  - CMET with GFR - TSH - Amb Ref to Medical Weight Management  3. Pure hypercholesterolemia Checking labs today, medications will be adjusted as needed.  - Lipid panel - Amb Ref to Medical Weight Management  4. Glucose intolerance (impaired glucose tolerance) stable - POCT A1C - Amb Ref to Medical Weight Management  5. BMI 30.0-30.9,adult - Amb Ref to Medical Weight Management  6. Allergic contact dermatitis due to metals - triamcinolone cream (KENALOG) 0.1 %; Apply 1 application topically 2 (two) times daily.  Return in about 6 months (around 04/03/2020).    Rutherford Guys, MD Primary Care at [place], [place] [address] Ph.  [phone] Fax [phone]

## 2019-10-05 LAB — CMP14+EGFR
ALT: 17 IU/L (ref 0–32)
AST: 24 IU/L (ref 0–40)
Albumin/Globulin Ratio: 1.5 (ref 1.2–2.2)
Albumin: 4.7 g/dL (ref 3.8–4.9)
Alkaline Phosphatase: 86 IU/L (ref 39–117)
BUN/Creatinine Ratio: 21 (ref 9–23)
BUN: 14 mg/dL (ref 6–24)
Bilirubin Total: 0.3 mg/dL (ref 0.0–1.2)
CO2: 19 mmol/L — ABNORMAL LOW (ref 20–29)
Calcium: 10.4 mg/dL — ABNORMAL HIGH (ref 8.7–10.2)
Chloride: 102 mmol/L (ref 96–106)
Creatinine, Ser: 0.67 mg/dL (ref 0.57–1.00)
GFR calc Af Amer: 117 mL/min/{1.73_m2} (ref >59)
GFR calc non Af Amer: 101 mL/min/{1.73_m2} (ref >59)
Globulin, Total: 3.2 g/dL (ref 1.5–4.5)
Glucose: 115 mg/dL — ABNORMAL HIGH (ref 65–99)
Potassium: 3.6 mmol/L (ref 3.5–5.2)
Sodium: 143 mmol/L (ref 134–144)
Total Protein: 7.9 g/dL (ref 6.0–8.5)

## 2019-10-05 LAB — LIPID PANEL
Chol/HDL Ratio: 3.1 ratio (ref 0.0–4.4)
Cholesterol, Total: 179 mg/dL (ref 100–199)
HDL: 57 mg/dL (ref >39)
LDL Chol Calc (NIH): 91 mg/dL (ref 0–99)
Triglycerides: 185 mg/dL — ABNORMAL HIGH (ref 0–149)
VLDL Cholesterol Cal: 31 mg/dL (ref 5–40)

## 2019-10-05 LAB — TSH: TSH: 0.825 u[IU]/mL (ref 0.450–4.500)

## 2019-10-25 ENCOUNTER — Other Ambulatory Visit: Payer: Self-pay

## 2019-10-25 ENCOUNTER — Ambulatory Visit (HOSPITAL_COMMUNITY)
Admission: EM | Admit: 2019-10-25 | Discharge: 2019-10-25 | Disposition: A | Discharge status: Home / Self Care | Payer: BC Managed Care – PPO | Attending: Family Medicine | Admitting: Family Medicine

## 2019-10-25 ENCOUNTER — Encounter (HOSPITAL_COMMUNITY): Payer: Self-pay

## 2019-10-25 DIAGNOSIS — Z20822 Contact with and (suspected) exposure to covid-19: Secondary | ICD-10-CM | POA: Diagnosis not present

## 2019-10-25 DIAGNOSIS — I10 Essential (primary) hypertension: Secondary | ICD-10-CM

## 2019-10-25 NOTE — ED Triage Notes (Signed)
Pt here for covid testing after exposure at work last week, found out today the coworker was positive, pt denies sx.

## 2019-10-25 NOTE — Discharge Instructions (Addendum)
If your Covid-19 test is positive, you will receive a phone call from [facility] regarding your results. Negative test results are not called. Both positive and negative results area always visible on MyChart. If you do not have a MyChart account, sign up instructions are in your discharge papers.  

## 2019-10-25 NOTE — ED Provider Notes (Signed)
[facility]   [id number] 10/25/19 Arrival Time: [id number]  ASSESSMENT & PLAN:  1. Exposure to COVID-19 virus      COVID-19 testing sent. To self-quarantine until results are available. If requested, work note provided.  Follow-up Information    Rutherford Guys, MD.   Specialty: Family Medicine Why: As needed. Contact information: [address] [place] [address] [phone]           Reviewed expectations re: course of current medical issues. Questions answered. Outlined signs and symptoms indicating need for more acute intervention. Patient verbalized understanding. After Visit Summary given.   SUBJECTIVE: History from: patient. Lauren Paul is a 54 y.o. female who requests COVID-19 testing. Known COVID-19 contact: co-worker. Recent travel: none. Denies: runny nose, congestion, fever, cough, sore throat, difficulty breathing and headache. Normal PO intake without n/v/d.  ROS: As per HPI.   OBJECTIVE:  Vitals:   10/25/19 1008  BP: (!) 145/103  Pulse: 73  Resp: 16  Temp: 98.7 F (37.1 C)  TempSrc: Oral  SpO2: 99%    General appearance: alert; no distress Eyes: PERRLA; EOMI; conjunctiva normal HENT: [place]; AT; nasal mucosa normal; oral mucosa normal Neck: supple  Lungs: speaks full sentences without difficulty; unlabored Heart: regular rate and rhythm Abdomen: soft, non-tender Extremities: no edema Skin: warm and dry Neurologic: normal gait Psychological: alert and cooperative; normal mood and affect  Labs:  Labs Reviewed  NOVEL CORONAVIRUS, NAA (HOSP ORDER, SEND-OUT TO REF LAB; TAT 18-24 HRS)      Allergies  Allergen Reactions  . Bee Venom Anaphylaxis  . Benadryl [Diphenhydramine Hcl] Shortness Of Breath    Pt states she can take Childrens Benadryl dye free fine.  . Diphenhydramine Hcl Anaphylaxis and Swelling  . Doxycycline Swelling  . Latex Swelling  . Peanut (Diagnostic) Anaphylaxis, Hives, Itching and Rash  . Penicillins  Anaphylaxis    Has patient had a PCN reaction causing immediate rash, facial/tongue/throat swelling, SOB or lightheadedness with hypotension: Yes Has patient had a PCN reaction causing severe rash involving mucus membranes or skin necrosis: No Has patient had a PCN reaction that required hospitalization: Unknown Has patient had a PCN reaction occurring within the last 10 years: No If all of the above answers are "NO", then may proceed with Cephalosporin use.   Marland Kitchen Apap-Pamabrom-Pyrilamine   . Betadine [Povidone Iodine]   . Iodides   . Losartan Other (See Comments)    LEG CRAMPS  . Olmesartan     Leg cramp   . Other     Watermelon- Anaphylaxis Nuts Melon  . Shellfish Allergy   . Sulfa Antibiotics   . Valsartan     Fatigue and alopecia    Past Medical History:  Diagnosis Date  . Allergy   . Anemia   . Arthritis   . Asthma   . Headache    migraines  . Hypersomnolence 06/19/2014  . Hypertension   . Meningitis   . Rectal fissure   . Sepsis ([place])   . Ulcer    Social History   Socioeconomic History  . Marital status: Single    Spouse name: Not on file  . Number of children: 0  . Years of education: 50  . Highest education level: Not on file  Occupational History  . Occupation: CLERICAL    Comment: [employer]  Tobacco Use  . Smoking status: Never Smoker  . Smokeless tobacco: Never Used  Substance and Sexual Activity  . Alcohol use: Yes    Alcohol/week:  2.0 standard drinks    Types: 2 Standard drinks or equivalent per week    Comment: 2-3 glasses of wkly  . Drug use: No  . Sexual activity: Yes    Birth control/protection: Surgical  Other Topics Concern  . Not on file  Social History Narrative   Marital status:  Single; not dating.  Not interested in 2018.      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      Tobacco:  None      Alcohol:   Weekends; 5 glasses of wine weekly      Exercise:  Sporadic.       Caffeine: One cup of caffeine daily.         Social Determinants of Health   Financial Resource Strain:   . Difficulty of Paying Living Expenses: Not on file  Food Insecurity:   . Worried About Charity fundraiser in the Last Year: Not on file  . Ran Out of Food in the Last Year: Not on file  Transportation Needs:   . Lack of Transportation (Medical): Not on file  . Lack of Transportation (Non-Medical): Not on file  Physical Activity:   . Days of Exercise per Week: Not on file  . Minutes of Exercise per Session: Not on file  Stress:   . Feeling of Stress : Not on file  Social Connections:   . Frequency of Communication with Friends and Family: Not on file  . Frequency of Social Gatherings with Friends and Family: Not on file  . Attends Religious Services: Not on file  . Active Member of Clubs or Organizations: Not on file  . Attends Archivist Meetings: Not on file  . Marital Status: Not on file  Intimate Partner Violence:   . Fear of Current or Ex-Partner: Not on file  . Emotionally Abused: Not on file  . Physically Abused: Not on file  . Sexually Abused: Not on file   Family History  Problem Relation Age of Onset  . Bladder Cancer Mother 29  . Uterine cancer Mother   . Cancer Mother 50       Uterine and bladder cancer  . Hypertension Mother   . Multiple sclerosis Mother   . Lung cancer Father   . Cancer Father 45       lung cancer  . Hypertension Sister   . Allergic rhinitis Neg Hx   . Angioedema Neg Hx   . Asthma Neg Hx   . Eczema Neg Hx   . Immunodeficiency Neg Hx   . Urticaria Neg Hx    Past Surgical History:  Procedure Laterality Date  . ABLATION    . DILATION AND CURETTAGE OF UTERUS    . TUBAL LIGATION       Vanessa Kick, MD 10/27/19 [phone]

## 2019-10-28 LAB — NOVEL CORONAVIRUS, NAA (HOSP ORDER, SEND-OUT TO REF LAB; TAT 18-24 HRS): SARS-CoV-2, NAA: NOT DETECTED

## 2020-01-03 IMAGING — CT CT HEAD W/O CM
3 series · 16 of 47 positions shown, 19 images · non-contrast
Comparison: None.

CLINICAL DATA: Worst headache of life for 3 days. Nausea.

EXAM:
CT HEAD WITHOUT CONTRAST
TECHNIQUE: Contiguous axial images were obtained from the base of the skull
through the vertex without intravenous contrast.

[Series 2: head wo · axial · 0.44mm/px · z∈[-162,-32]mm · 10 of 32 slices shown, 13 images]
[im 3/32  brain]
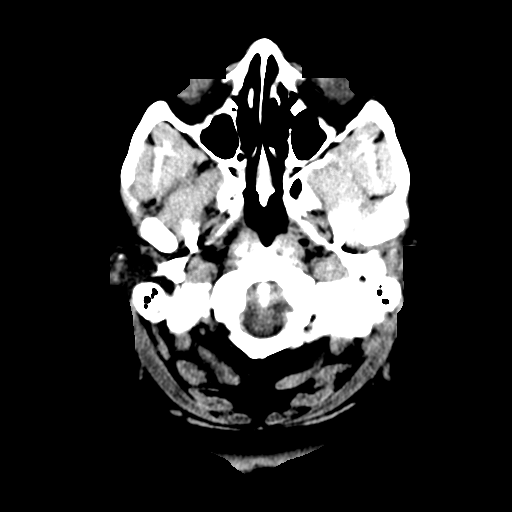
[im 3/32  bone]
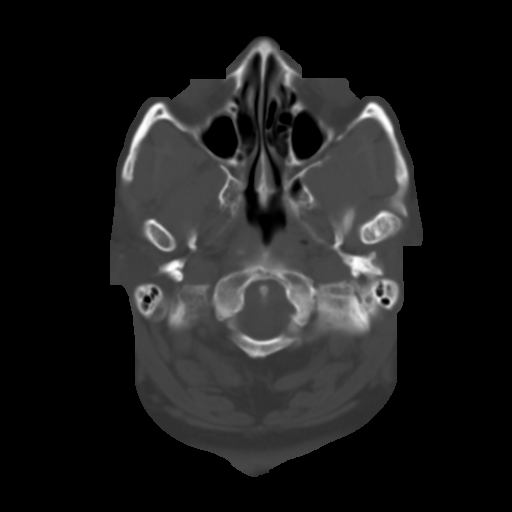
[im 6/32  brain]
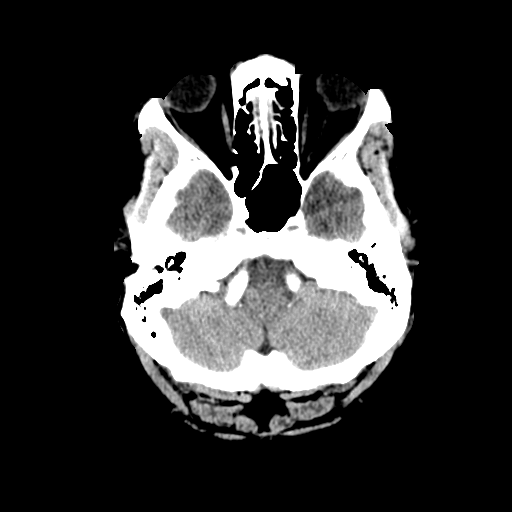
[im 9/32  brain]
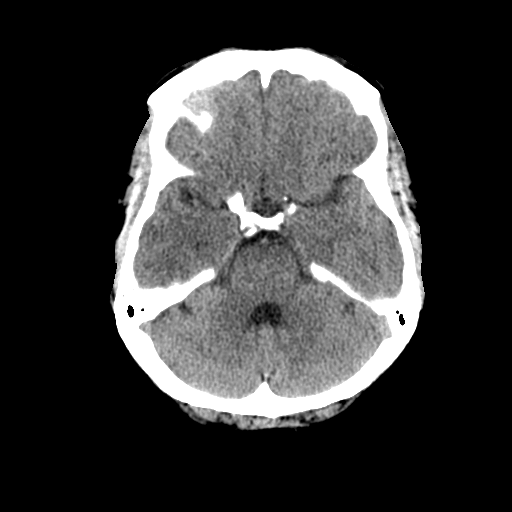
[im 11/32  brain]
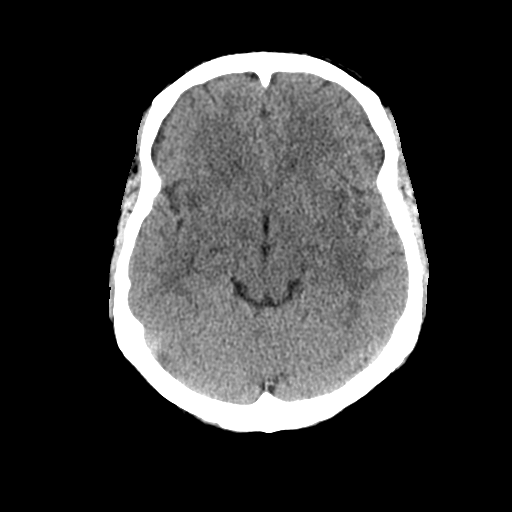
[im 14/32  brain]
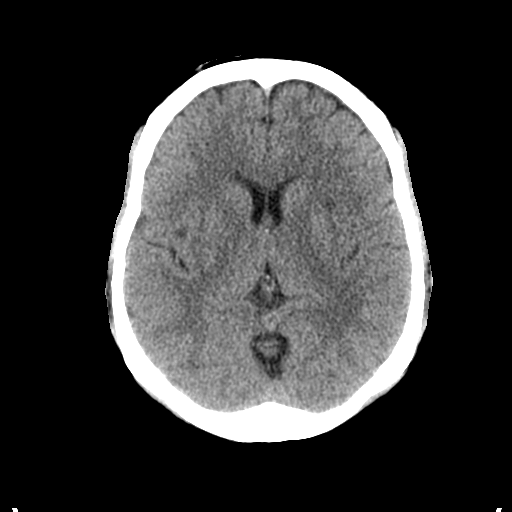
[im 14/32  bone]
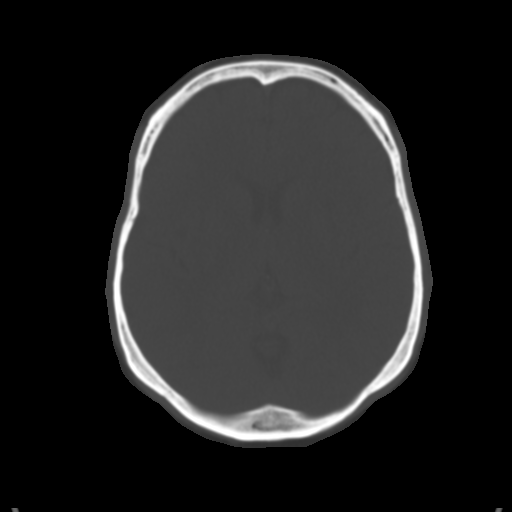
[im 18/32  brain]
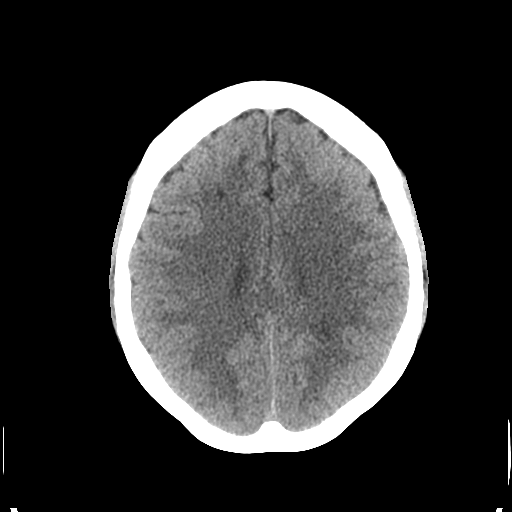
[im 21/32  brain]
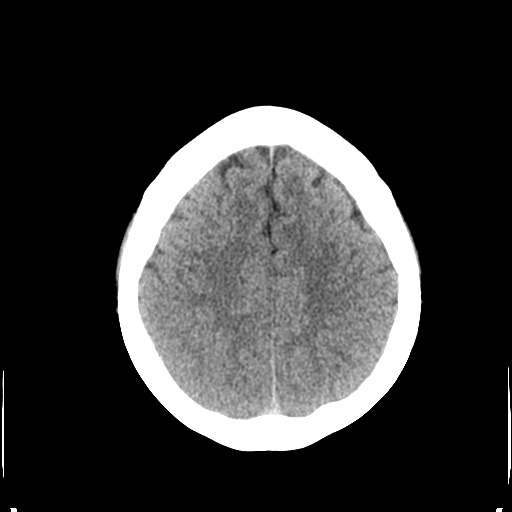
[im 24/32  brain]
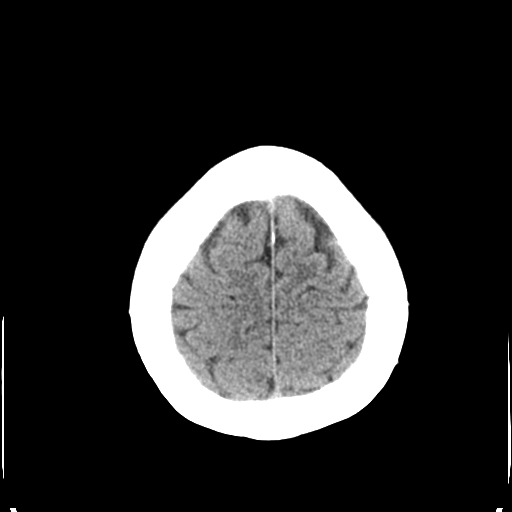
[im 26/32  brain]
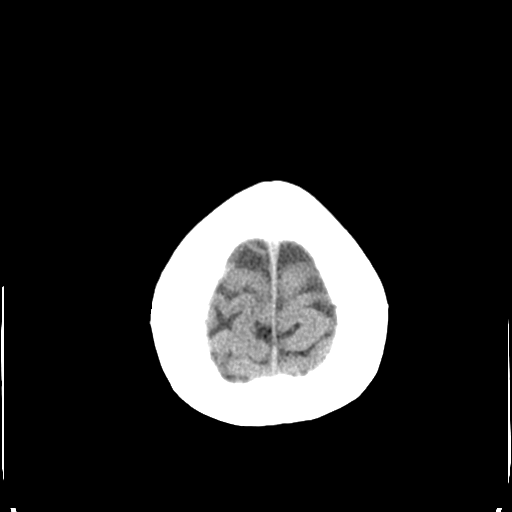
[im 26/32  bone]
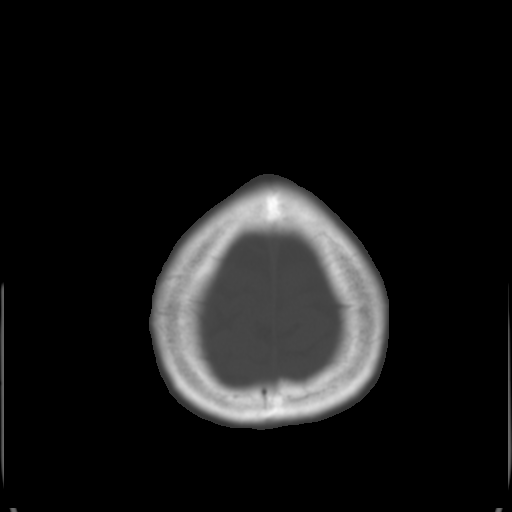
[im 29/32  brain]
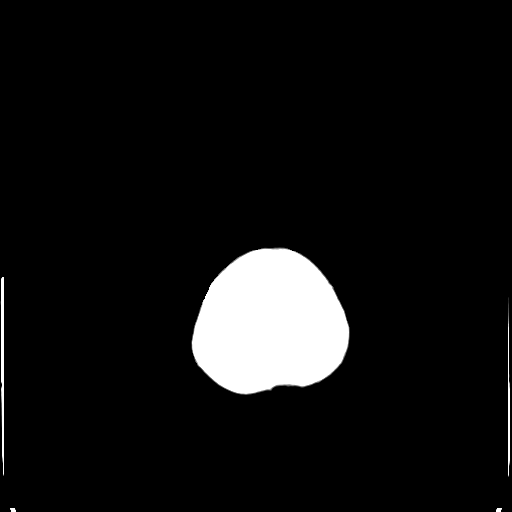

[Series 4: coronal soft tissue · coronal · 0.31mm/px · 3 of 69 slices shown]
[im 23/69  brain]
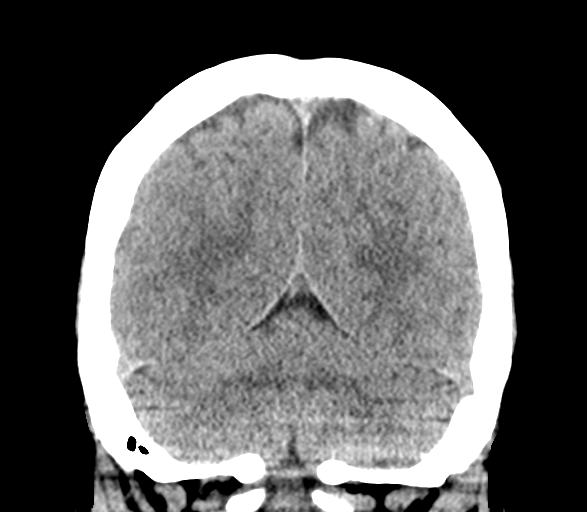
[im 31/69  brain]
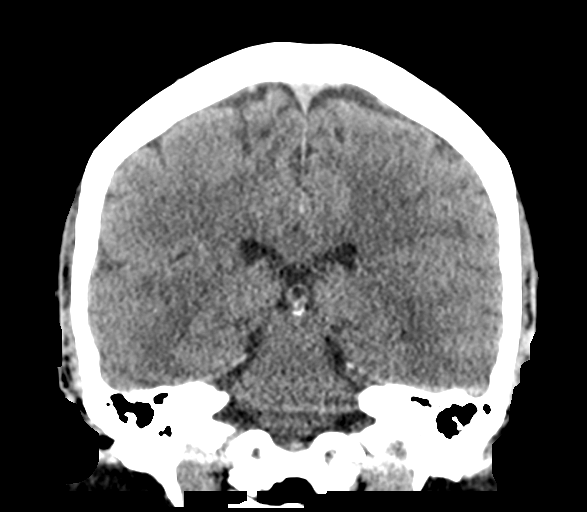
[im 38/69  brain]
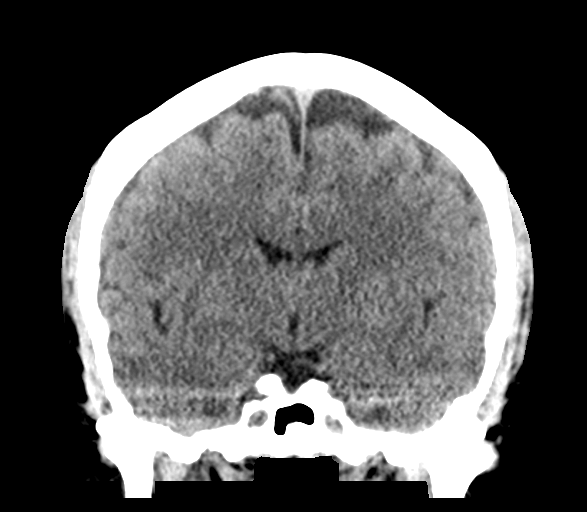

[Series 5: sagittal soft tissue · sagittal · 0.34mm/px · 3 of 59 slices shown]
[im 20/59  brain]
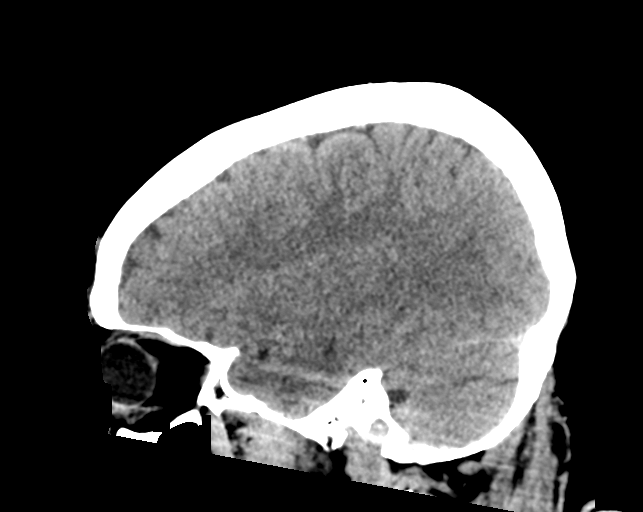
[im 30/59  brain]
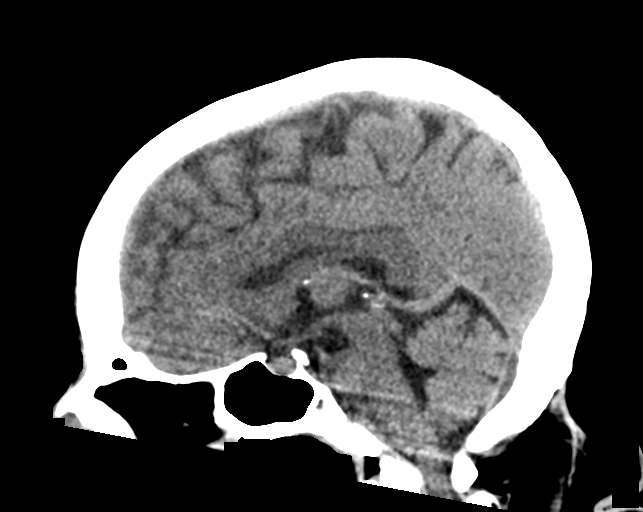
[im 39/59  brain]
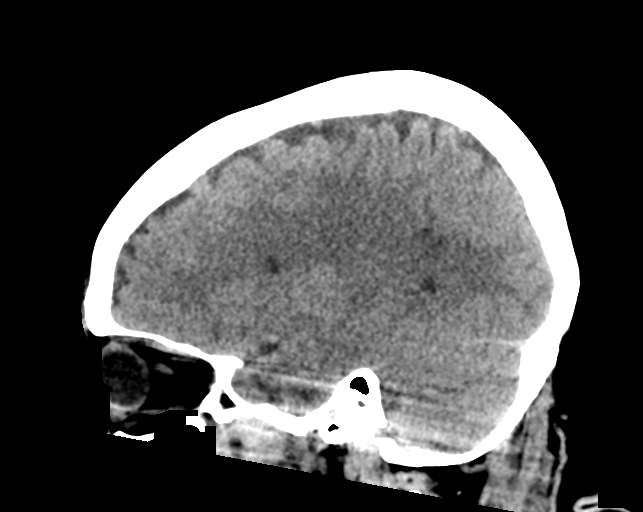

[16 of 47 positions shown; findings below may reference images not displayed]

FINDINGS: Brain: There is no evidence of acute infarct, intracranial
hemorrhage, mass, midline shift, or extra-axial fluid collection.
The ventricles and sulci are normal. Scattered cerebral white matter
hypodensities are nonspecific but compatible with mild chronic small
vessel ischemic disease.

Vascular: Calcified atherosclerosis at the skull base. No hyperdense
vessel.

Skull: No fracture or focal osseous lesion.

Sinuses/Orbits: Visualized paranasal sinuses and mastoid air cells
are clear. Orbits are unremarkable.

Other: None.
IMPRESSION: 1. No evidence of acute intracranial abnormality.
2. Mild chronic small vessel ischemic disease.

## 2020-01-05 ENCOUNTER — Encounter: Payer: Self-pay | Admitting: Family Medicine

## 2020-03-18 ENCOUNTER — Other Ambulatory Visit: Payer: Self-pay | Admitting: Family Medicine

## 2020-03-18 NOTE — Telephone Encounter (Signed)
Requested Prescriptions  Pending Prescriptions Disp Refills  . hydrochlorothiazide (HYDRODIURIL) 12.5 MG tablet [Pharmacy Med Name: HYDROCHLOROTHIAZIDE 12.5 MG TB] 90 tablet 3    Sig: TAKE 1 TABLET BY MOUTH EVERY DAY     Cardiovascular: Diuretics - Thiazide Failed - 03/18/2020  9:46 AM      Failed - Ca in normal range and within 360 days    Calcium  Date Value Ref Range Status  10/04/2019 10.4 (H) 8.7 - 10.2 mg/dL Final         Failed - Last BP in normal range    BP Readings from Last 1 Encounters:  10/25/19 (!) 145/103         Passed - Cr in normal range and within 360 days    Creat  Date Value Ref Range Status  08/06/2016 0.96 0.50 - 1.10 mg/dL Final   Creatinine, Ser  Date Value Ref Range Status  10/04/2019 0.67 0.57 - 1.00 mg/dL Final         Passed - K in normal range and within 360 days    Potassium  Date Value Ref Range Status  10/04/2019 3.6 3.5 - 5.2 mmol/L Final         Passed - Na in normal range and within 360 days    Sodium  Date Value Ref Range Status  10/04/2019 143 134 - 144 mmol/L Final         Passed - Valid encounter within last 6 months    Recent Outpatient Visits          5 months ago Urinary urgency   [facility], Lilia Argue, MD   11 months ago Annual physical exam   [facility], Lilia Argue, MD   1 year ago Essential hypertension, benign   [facility], Lilia Argue, MD   1 year ago Lightheaded   [facility], Damaris Hippo, PA-C   1 year ago Other chest pain   [facility] at [facility], Renette Butters, MD      Future Appointments            In 1 week Rutherford Guys, MD [facility] at [place], [facility]

## 2020-03-23 ENCOUNTER — Other Ambulatory Visit: Payer: Self-pay | Admitting: Family Medicine

## 2020-03-24 NOTE — Telephone Encounter (Signed)
Patient is requesting a refill of the following medications: Requested Prescriptions   Pending Prescriptions Disp Refills   traZODone (DESYREL) 50 MG tablet [Pharmacy Med Name: TRAZODONE 50 MG TABLET] 180 tablet 3    Sig: TAKE 1-2 TABLETS (50-100 MG TOTAL) BY MOUTH AT BEDTIME AS NEEDED. FOR SLEEP   escitalopram (LEXAPRO) 20 MG tablet [Pharmacy Med Name: ESCITALOPRAM 20 MG TABLET] 90 tablet 3    Sig: TAKE 1 TABLET BY MOUTH EVERY DAY    Date of patient request: 03/23/2020   Last office visit: 10/04/2019 Date of last refill: 03/31/2019 Last refill amount:90 tablets  Follow up time period per chart: 03/27/2020

## 2020-03-27 ENCOUNTER — Other Ambulatory Visit: Payer: Self-pay

## 2020-03-27 ENCOUNTER — Ambulatory Visit (INDEPENDENT_AMBULATORY_CARE_PROVIDER_SITE_OTHER): Payer: BC Managed Care – PPO | Admitting: Family Medicine

## 2020-03-27 ENCOUNTER — Encounter: Payer: Self-pay | Admitting: Family Medicine

## 2020-03-27 VITALS — BP 119/77 | HR 79 | Temp 98.0°F | Resp 16 | Ht 60.0 in | Wt 156.0 lb

## 2020-03-27 DIAGNOSIS — E7800 Pure hypercholesterolemia, unspecified: Secondary | ICD-10-CM | POA: Diagnosis not present

## 2020-03-27 DIAGNOSIS — I10 Essential (primary) hypertension: Secondary | ICD-10-CM

## 2020-03-27 DIAGNOSIS — R7302 Impaired glucose tolerance (oral): Secondary | ICD-10-CM | POA: Diagnosis not present

## 2020-03-27 MED ORDER — HYDROCHLOROTHIAZIDE 12.5 MG PO TABS: 12.5000 mg | ORAL_TABLET | Freq: Every day | ORAL | 1 refills | Status: AC

## 2020-03-27 MED ORDER — TRAZODONE HCL 50 MG PO TABS: 50.0000 mg | ORAL_TABLET | Freq: Every evening | ORAL | 3 refills | Status: DC | PRN

## 2020-03-27 MED ORDER — EPINEPHRINE 0.3 MG/0.3ML IJ SOAJ: 0.3000 mg | INTRAMUSCULAR | 2 refills | Status: DC | PRN

## 2020-03-27 MED ORDER — ESCITALOPRAM OXALATE 20 MG PO TABS: 20.0000 mg | ORAL_TABLET | Freq: Every day | ORAL | 3 refills | Status: AC

## 2020-03-27 NOTE — Patient Instructions (Signed)
° ° ° °  If you have lab work done today you will be contacted with your lab results within the next 2 weeks.  If you have not heard from us then please contact us. The fastest way to get your results is to register for My Chart. ° ° °IF you received an x-ray today, you will receive an invoice from [facility]. Please contact [facility] at [phone] with questions or concerns regarding your invoice.  ° °IF you received labwork today, you will receive an invoice from [facility]. Please contact [facility] at [phone] with questions or concerns regarding your invoice.  ° °Our billing staff will not be able to assist you with questions regarding bills from these companies. ° °You will be contacted with the lab results as soon as they are available. The fastest way to get your results is to activate your My Chart account. Instructions are located on the last page of this paperwork. If you have not heard from us regarding the results in 2 weeks, please contact this office. °  ° ° ° °

## 2020-03-27 NOTE — Progress Notes (Signed)
6/7/20213:44 PM  Lauren Paul 1966/03/01, 54 y.o., female [id number]  Chief Complaint  Patient presents with  . Hypertension    and weight check 6 month follow up  . Medication Refill    epi pen    HPI:   Patient is a 54 y.o. female with past medical history significant for  HTN, asthma, OSA, prediabetes, chronic back pain 2/2 spinal stenosis, hot flashes, obesitywho presents todayfor routine followup    Last OV dec 2020 - no changes  Overall doing well Low carb diet and regular walking, losing weight Ate a pecan by mistake and had tongue swelling, did not need to go to the hospital Has completed covid vaccine lexparo works well for hot flashes Cont to take HCTZ daily   Wt Readings from Last 3 Encounters:  03/27/20 156 lb (70.8 kg)  10/04/19 164 lb 12.8 oz (74.8 kg)  03/31/19 165 lb (74.8 kg)   BP Readings from Last 3 Encounters:  03/27/20 119/77  10/25/19 (!) 145/103  10/04/19 134/86    Depression screen PHQ 2/9 03/27/2020 10/04/2019 03/31/2019  Decreased Interest 0 0 0  Down, Depressed, Hopeless 0 0 0  PHQ - 2 Score 0 0 0    Fall Risk  03/27/2020 10/04/2019 03/31/2019 07/15/2018 05/15/2018  Falls in the past year? 0 0 1 No No  Number falls in past yr: - 0 0 - -  Injury with Fall? - 0 1 - -  Follow up Falls evaluation completed - - - -     Allergies  Allergen Reactions  . Bee Venom Anaphylaxis  . Benadryl [Diphenhydramine Hcl] Shortness Of Breath    Pt states she can take Childrens Benadryl dye free fine.  . Diphenhydramine Hcl Anaphylaxis and Swelling  . Doxycycline Swelling  . Latex Swelling  . Peanut (Diagnostic) Anaphylaxis, Hives, Itching and Rash  . Penicillins Anaphylaxis    Has patient had a PCN reaction causing immediate rash, facial/tongue/throat swelling, SOB or lightheadedness with hypotension: Yes Has patient had a PCN reaction causing severe rash involving mucus membranes or skin necrosis: No Has patient had a PCN reaction that required  hospitalization: Unknown Has patient had a PCN reaction occurring within the last 10 years: No If all of the above answers are "NO", then may proceed with Cephalosporin use.   Marland Kitchen Apap-Pamabrom-Pyrilamine   . Betadine [Povidone Iodine]   . Iodides   . Losartan Other (See Comments)    LEG CRAMPS  . Olmesartan     Leg cramp   . Other     Watermelon- Anaphylaxis Nuts Melon  . Shellfish Allergy   . Sulfa Antibiotics   . Valsartan     Fatigue and alopecia    Prior to Admission medications   Medication Sig Start Date End Date Taking? Authorizing Provider  EPINEPHrine (EPIPEN 2-PAK) 0.3 mg/0.3 mL IJ SOAJ injection Use as directed for severe allergic reaction 07/21/17  Yes Bardelas, Jose A, MD  escitalopram (LEXAPRO) 20 MG tablet TAKE 1 TABLET BY MOUTH EVERY DAY 03/24/20  Yes Rutherford Guys, MD  hydrochlorothiazide (HYDRODIURIL) 12.5 MG tablet TAKE 1 TABLET BY MOUTH EVERY DAY 03/18/20  Yes Rutherford Guys, MD  SF 5000 PLUS 1.1 % CREA dental cream Place 1 application onto teeth daily.  04/21/18  Yes [provider]  traZODone (DESYREL) 50 MG tablet TAKE 1-2 TABLETS (50-100 MG TOTAL) BY MOUTH AT BEDTIME AS NEEDED. FOR SLEEP 03/24/20  Yes Rutherford Guys, MD  triamcinolone cream (KENALOG) 0.1 % Apply  1 application topically 2 (two) times daily. Patient not taking: Reported on 03/27/2020 10/04/19   Rutherford Guys, MD    Past Medical History:  Diagnosis Date  . Allergy   . Anemia   . Arthritis   . Asthma   . Headache    migraines  . Hypersomnolence 06/19/2014  . Hypertension   . Meningitis   . Rectal fissure   . Sepsis ([place])   . Ulcer     Past Surgical History:  Procedure Laterality Date  . ABLATION    . DILATION AND CURETTAGE OF UTERUS    . TUBAL LIGATION      Social History   Tobacco Use  . Smoking status: Never Smoker  . Smokeless tobacco: Never Used  Substance Use Topics  . Alcohol use: Yes    Alcohol/week: 2.0 standard drinks    Types: 2 Standard drinks or  equivalent per week    Comment: 2-3 glasses of wkly    Family History  Problem Relation Age of Onset  . Bladder Cancer Mother 15  . Uterine cancer Mother   . Cancer Mother 57       Uterine and bladder cancer  . Hypertension Mother   . Multiple sclerosis Mother   . Lung cancer Father   . Cancer Father 88       lung cancer  . Hypertension Sister   . Allergic rhinitis Neg Hx   . Angioedema Neg Hx   . Asthma Neg Hx   . Eczema Neg Hx   . Immunodeficiency Neg Hx   . Urticaria Neg Hx     Review of Systems  Constitutional: Negative for chills, fever and malaise/fatigue.  Respiratory: Negative for cough and shortness of breath.   Cardiovascular: Negative for chest pain, palpitations and leg swelling.  Gastrointestinal: Negative for abdominal pain, nausea and vomiting.  Neurological: Negative for dizziness and headaches.     OBJECTIVE:  Today's Vitals   03/27/20 1501  BP: 119/77  Pulse: 79  Resp: 16  Temp: 98 F (36.7 C)  TempSrc: Temporal  SpO2: 97%  Weight: 156 lb (70.8 kg)  Height: 5' (1.524 m)   Body mass index is 30.47 kg/m.   Physical Exam Vitals and nursing note reviewed.  Constitutional:      Appearance: She is well-developed.  HENT:     Head: Normocephalic and atraumatic.     Mouth/Throat:     Pharynx: No oropharyngeal exudate.  Eyes:     General: No scleral icterus.    Conjunctiva/sclera: Conjunctivae normal.     Pupils: Pupils are equal, round, and reactive to light.  Cardiovascular:     Rate and Rhythm: Normal rate and regular rhythm.     Heart sounds: Normal heart sounds. No murmur. No friction rub. No gallop.   Pulmonary:     Effort: Pulmonary effort is normal.     Breath sounds: Normal breath sounds. No wheezing or rales.  Musculoskeletal:     Cervical back: Neck supple.  Skin:    General: Skin is warm and dry.  Neurological:     Mental Status: She is alert and oriented to person, place, and time.     No results found for this or any  previous visit (from the past 24 hour(s)).  No results found.   ASSESSMENT and PLAN  1. Essential hypertension, benign Controlled. Continue current regime.   2. Pure hypercholesterolemia Checking labs today, medications will be adjusted as needed.  Cont working on [employer] -  Comprehensive metabolic panel - Lipid panel  3. Glucose intolerance (impaired glucose tolerance) Labs pending. Cont working on [facility] - Hemoglobin A1c  4. Hypercalcemia - Calcium, ionized  Other orders - EPINEPHrine (EPIPEN 2-PAK) 0.3 mg/0.3 mL IJ SOAJ injection; Inject 0.3 mLs (0.3 mg total) into the muscle as needed for anaphylaxis. Use as directed for severe allergic reaction - escitalopram (LEXAPRO) 20 MG tablet; Take 1 tablet (20 mg total) by mouth daily. - hydrochlorothiazide (HYDRODIURIL) 12.5 MG tablet; Take 1 tablet (12.5 mg total) by mouth daily. - traZODone (DESYREL) 50 MG tablet; Take 1-2 tablets (50-100 mg total) by mouth at bedtime as needed. for sleep  Return for as scheduled.    Rutherford Guys, MD [facility] [place], [place] [address] Ph.  [phone] Fax [phone]

## 2020-03-28 LAB — LIPID PANEL
Chol/HDL Ratio: 3.5 ratio (ref 0.0–4.4)
Cholesterol, Total: 211 mg/dL — ABNORMAL HIGH (ref 100–199)
HDL: 60 mg/dL (ref >39)
LDL Chol Calc (NIH): 124 mg/dL — ABNORMAL HIGH (ref 0–99)
Triglycerides: 154 mg/dL — ABNORMAL HIGH (ref 0–149)
VLDL Cholesterol Cal: 27 mg/dL (ref 5–40)

## 2020-03-28 LAB — COMPREHENSIVE METABOLIC PANEL
ALT: 11 IU/L (ref 0–32)
AST: 18 IU/L (ref 0–40)
Albumin/Globulin Ratio: 1.6 (ref 1.2–2.2)
Albumin: 4.5 g/dL (ref 3.8–4.9)
Alkaline Phosphatase: 97 IU/L (ref 48–121)
BUN/Creatinine Ratio: 17 (ref 9–23)
BUN: 16 mg/dL (ref 6–24)
Bilirubin Total: 0.4 mg/dL (ref 0.0–1.2)
CO2: 22 mmol/L (ref 20–29)
Calcium: 9.9 mg/dL (ref 8.7–10.2)
Chloride: 100 mmol/L (ref 96–106)
Creatinine, Ser: 0.93 mg/dL (ref 0.57–1.00)
GFR calc Af Amer: 82 mL/min/{1.73_m2} (ref >59)
GFR calc non Af Amer: 71 mL/min/{1.73_m2} (ref >59)
Globulin, Total: 2.8 g/dL (ref 1.5–4.5)
Glucose: 91 mg/dL (ref 65–99)
Potassium: 3.7 mmol/L (ref 3.5–5.2)
Sodium: 140 mmol/L (ref 134–144)
Total Protein: 7.3 g/dL (ref 6.0–8.5)

## 2020-03-28 LAB — CALCIUM, IONIZED: Calcium, Ion: 5.2 mg/dL (ref 4.5–5.6)

## 2020-03-28 LAB — HEMOGLOBIN A1C
Est. average glucose Bld gHb Est-mCnc: 108 mg/dL
Hgb A1c MFr Bld: 5.4 % (ref 4.8–5.6)

## 2020-04-14 ENCOUNTER — Other Ambulatory Visit: Payer: Self-pay

## 2020-04-14 ENCOUNTER — Encounter: Payer: Self-pay | Admitting: Registered Nurse

## 2020-04-14 ENCOUNTER — Ambulatory Visit (INDEPENDENT_AMBULATORY_CARE_PROVIDER_SITE_OTHER): Payer: BC Managed Care – PPO | Admitting: Registered Nurse

## 2020-04-14 VITALS — BP 150/87 | HR 60 | Temp 97.6°F | Ht 60.0 in | Wt 155.0 lb

## 2020-04-14 DIAGNOSIS — Z87892 Personal history of anaphylaxis: Secondary | ICD-10-CM

## 2020-04-14 DIAGNOSIS — M272 Inflammatory conditions of jaws: Secondary | ICD-10-CM

## 2020-04-14 MED ORDER — EPINEPHRINE 0.3 MG/0.3ML IJ SOAJ: 0.3000 mg | INTRAMUSCULAR | 2 refills | Status: AC | PRN

## 2020-04-14 MED ORDER — DOXYCYCLINE HYCLATE 100 MG PO TABS: 100.0000 mg | ORAL_TABLET | Freq: Two times a day (BID) | ORAL | 0 refills | Status: DC

## 2020-04-14 MED ORDER — OXYCODONE HCL 5 MG PO TABS: 5.0000 mg | ORAL_TABLET | Freq: Four times a day (QID) | ORAL | 0 refills | Status: DC | PRN

## 2020-04-14 NOTE — Patient Instructions (Signed)
° ° ° °  If you have lab work done today you will be contacted with your lab results within the next 2 weeks.  If you have not heard from us then please contact us. The fastest way to get your results is to register for My Chart. ° ° °IF you received an x-ray today, you will receive an invoice from [facility]. Please contact [facility] at [phone] with questions or concerns regarding your invoice.  ° °IF you received labwork today, you will receive an invoice from [facility]. Please contact [facility] at [phone] with questions or concerns regarding your invoice.  ° °Our billing staff will not be able to assist you with questions regarding bills from these companies. ° °You will be contacted with the lab results as soon as they are available. The fastest way to get your results is to activate your My Chart account. Instructions are located on the last page of this paperwork. If you have not heard from us regarding the results in 2 weeks, please contact this office. °  ° ° ° °

## 2020-04-16 ENCOUNTER — Encounter: Payer: Self-pay | Admitting: Family Medicine

## 2020-04-16 NOTE — Progress Notes (Signed)
Call from the answering service at approximately 9 AM. Patient seen two days ago, started on antibiotics. Face swelling has worsened and swelling below jaw now. No respiratory distress/stridor but swelling worse.  Urgent care or ER evaluation recommended today.

## 2020-04-28 ENCOUNTER — Telehealth: Payer: Self-pay

## 2020-04-28 NOTE — Telephone Encounter (Signed)
Copied from [facility] #[id number]. Topic: General - Other >> Apr 25, 2020 12:22 PM Celene Kras wrote: Reason for CRM: Pt called stating that she was seen on 04/14/20. Pt states that she had swelling in her face. Pt states that she was prescribed doxycycline. Pt states that she is allergic to the doxycycline and it is on her chart. Pt states that she did have to go to the hospital due to a severe allergic reaction. Pt states that she now has a bill from the hospital and the office that she would like to have refuted and that she would like to speak with Corene Cornea. Pt states that she is very upset because she could have been killed. Pt is requesting a call back today. Please advise.

## 2020-05-02 NOTE — Progress Notes (Signed)
Acute Office Visit  Subjective:    Patient ID: Lauren Paul, female    DOB: 03/11/1966, 54 y.o.   MRN: [id number]  Chief Complaint  Patient presents with  . L side face swelling    pt has a broken tooth in upper L jaw she has been seen for . pain and swelling x 4 days tongue and under toungue . completed clyndamycin sunday  . epi pen rx    HPI Patient is in today for facial swelling. Mostly L side of face near jaw, but notices other swelling as well.  Does note that she has a broken tooth and overall poor dentition but is unable to have tooth removed until infection controlled. She was given a course of clindamycin which she reports has had no notable effect. Pain and tender along jaw line towards tonsillar node. No known radiation into head or neck. No sensory changes. No fevers, chills, fatigue, shob, sweats, nvd.   Past Medical History:  Diagnosis Date  . Allergy   . Anemia   . Arthritis   . Asthma   . Headache    migraines  . Hypersomnolence 06/19/2014  . Hypertension   . Meningitis   . Rectal fissure   . Sepsis ([place])   . Ulcer     Past Surgical History:  Procedure Laterality Date  . ABLATION    . DILATION AND CURETTAGE OF UTERUS    . TUBAL LIGATION      Family History  Problem Relation Age of Onset  . Bladder Cancer Mother 22  . Uterine cancer Mother   . Cancer Mother 76       Uterine and bladder cancer  . Hypertension Mother   . Multiple sclerosis Mother   . Lung cancer Father   . Cancer Father 7       lung cancer  . Hypertension Sister   . Allergic rhinitis Neg Hx   . Angioedema Neg Hx   . Asthma Neg Hx   . Eczema Neg Hx   . Immunodeficiency Neg Hx   . Urticaria Neg Hx     Social History   Socioeconomic History  . Marital status: Single    Spouse name: Not on file  . Number of children: 0  . Years of education: 54  . Highest education level: Not on file  Occupational History  . Occupation: CLERICAL    Comment: [employer]  Tobacco Use    . Smoking status: Never Smoker  . Smokeless tobacco: Never Used  Vaping Use  . Vaping Use: Never used  Substance and Sexual Activity  . Alcohol use: Yes    Alcohol/week: 2.0 standard drinks    Types: 2 Standard drinks or equivalent per week    Comment: 2-3 glasses of wkly  . Drug use: No  . Sexual activity: Yes    Birth control/protection: Surgical  Other Topics Concern  . Not on file  Social History Narrative   Marital status:  Single; not dating.  Not interested in 2018.      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      Tobacco:  None      Alcohol:  Weekends; 5 glasses of wine weekly      Exercise:  Sporadic.       Caffeine: One cup of  caffeine daily.         Social Determinants of Health   Financial Resource Strain:   . Difficulty of Paying Living Expenses:   Food Insecurity:   . Worried About Charity fundraiser in the Last Year:   . Arboriculturist in the Last Year:   Transportation Needs:   . Film/video editor (Medical):   Marland Kitchen Lack of Transportation (Non-Medical):   Physical Activity:   . Days of Exercise per Week:   . Minutes of Exercise per Session:   Stress:   . Feeling of Stress :   Social Connections:   . Frequency of Communication with Friends and Family:   . Frequency of Social Gatherings with Friends and Family:   . Attends Religious Services:   . Active Member of Clubs or Organizations:   . Attends Archivist Meetings:   Marland Kitchen Marital Status:   Intimate Partner Violence:   . Fear of Current or Ex-Partner:   . Emotionally Abused:   Marland Kitchen Physically Abused:   . Sexually Abused:     Outpatient Medications Prior to Visit  Medication Sig Dispense Refill  . clindamycin (CLEOCIN) 300 MG capsule Take 300 mg by mouth 3 (three) times daily.    Marland Kitchen escitalopram (LEXAPRO) 20 MG tablet Take 1 tablet (20 mg total) by mouth daily.  90 tablet 3  . hydrochlorothiazide (HYDRODIURIL) 12.5 MG tablet Take 1 tablet (12.5 mg total) by mouth daily. 90 tablet 1  . traZODone (DESYREL) 50 MG tablet Take 1-2 tablets (50-100 mg total) by mouth at bedtime as needed. for sleep 60 tablet 3  . SF 5000 PLUS 1.1 % CREA dental cream Place 1 application onto teeth daily.   3  . triamcinolone cream (KENALOG) 0.1 % Apply 1 application topically 2 (two) times daily. (Patient not taking: Reported on 03/27/2020) 30 g 1  . EPINEPHrine (EPIPEN 2-PAK) 0.3 mg/0.3 mL IJ SOAJ injection Inject 0.3 mLs (0.3 mg total) into the muscle as needed for anaphylaxis. Use as directed for severe allergic reaction (Patient not taking: Reported on 04/14/2020) 2 each 2   No facility-administered medications prior to visit.    Allergies  Allergen Reactions  . Bee Venom Anaphylaxis  . Benadryl [Diphenhydramine Hcl] Shortness Of Breath    Pt states she can take Childrens Benadryl dye free fine.  . Diphenhydramine Hcl Anaphylaxis and Swelling  . Doxycycline Swelling  . Latex Swelling  . Peanut (Diagnostic) Anaphylaxis, Hives, Itching and Rash  . Penicillins Anaphylaxis    Has patient had a PCN reaction causing immediate rash, facial/tongue/throat swelling, SOB or lightheadedness with hypotension: Yes Has patient had a PCN reaction causing severe rash involving mucus membranes or skin necrosis: No Has patient had a PCN reaction that required hospitalization: Unknown Has patient had a PCN reaction occurring within the last 10 years: No If all of the above answers are "NO", then may proceed with Cephalosporin use.   Marland Kitchen Apap-Pamabrom-Pyrilamine   . Betadine [Povidone Iodine]   . Iodides   . Losartan Other (See Comments)    LEG CRAMPS  . Olmesartan     Leg cramp   . Other     Watermelon- Anaphylaxis Nuts Melon  . Shellfish Allergy   . Sulfa Antibiotics   . Valsartan     Fatigue and alopecia    Review of Systems  Constitutional: Negative.   HENT: Positive for  dental problem and facial swelling. Negative for congestion, drooling, ear discharge, ear pain, hearing  loss, mouth sores, nosebleeds, postnasal drip, rhinorrhea, sinus pressure, sinus pain, sneezing, sore throat, tinnitus, trouble swallowing and voice change.   Eyes: Negative.   Respiratory: Negative.   Cardiovascular: Negative.   Gastrointestinal: Negative.   Endocrine: Negative.   Genitourinary: Negative.   Musculoskeletal: Negative.   Skin: Negative.   Allergic/Immunologic: Negative.   Neurological: Negative.   Hematological: Negative.   Psychiatric/Behavioral: Negative.   All other systems reviewed and are negative.      Objective:    Physical Exam Vitals and nursing note reviewed.  Constitutional:      General: She is not in acute distress.    Appearance: Normal appearance. She is not ill-appearing, toxic-appearing or diaphoretic.  HENT:     Head: Normocephalic and atraumatic.     Jaw: There is normal jaw occlusion. Tenderness, swelling and pain on movement present. No trismus or malocclusion.     Salivary Glands: Right salivary gland is diffusely enlarged and tender. Left salivary gland is diffusely enlarged and tender.     Comments: Pt with notably swollen face, L>R.  Subglossal salivary glands swollen, erythema in gums and surrounding poor dentition.  No distinct drainable abscess noted Jaw ttp, tonsillar node on L side TTP, some TTP noted when palpating cervical chains. All nodes moble and nonfixed.    Mouth/Throat:     Mouth: Mucous membranes are moist.     Pharynx: Oropharynx is clear. Posterior oropharyngeal erythema present. No oropharyngeal exudate.  Cardiovascular:     Rate and Rhythm: Normal rate and regular rhythm.  Pulmonary:     Effort: Pulmonary effort is normal. No respiratory distress.  Neurological:     General: No focal deficit present.     Mental Status: She is alert and oriented to person, place, and time. Mental status is at baseline.    Psychiatric:        Mood and Affect: Mood normal.        Behavior: Behavior normal.        Thought Content: Thought content normal.        Judgment: Judgment normal.     BP (!) 150/87   Pulse 60   Temp 97.6 F (36.4 C)   Ht 5' (1.524 m)   Wt 155 lb (70.3 kg)   SpO2 100%   BMI 30.27 kg/m  Wt Readings from Last 3 Encounters:  04/14/20 155 lb (70.3 kg)  03/27/20 156 lb (70.8 kg)  10/04/19 164 lb 12.8 oz (74.8 kg)    Health Maintenance Due  Topic Date Due  . Hepatitis C Screening  Never done  . MAMMOGRAM  06/09/2018  . PAP SMEAR-Modifier  04/03/2020    There are no preventive care reminders to display for this patient.   Lab Results  Component Value Date   TSH 0.825 10/04/2019   Lab Results  Component Value Date   WBC 6.6 06/24/2018   HGB 14.5 06/24/2018   HCT 42.6 06/24/2018   MCV 86.8 06/24/2018   PLT 268 06/24/2018   Lab Results  Component Value Date   NA 140 03/27/2020   K 3.7 03/27/2020   CO2 22 03/27/2020   GLUCOSE 91 03/27/2020   BUN 16 03/27/2020   CREATININE 0.93 03/27/2020   BILITOT 0.4 03/27/2020   ALKPHOS 97 03/27/2020   AST 18 03/27/2020   ALT 11 03/27/2020   PROT 7.3 03/27/2020   ALBUMIN 4.5 03/27/2020   CALCIUM 9.9 03/27/2020   ANIONGAP 9 06/24/2018   Lab Results  Component Value Date  CHOL 211 (H) 03/27/2020   Lab Results  Component Value Date   HDL 60 03/27/2020   Lab Results  Component Value Date   LDLCALC 124 (H) 03/27/2020   Lab Results  Component Value Date   TRIG 154 (H) 03/27/2020   Lab Results  Component Value Date   CHOLHDL 3.5 03/27/2020   Lab Results  Component Value Date   HGBA1C 5.4 03/27/2020       Assessment & Plan:   Problem List Items Addressed This Visit    None    Visit Diagnoses    History of anaphylaxis    -  Primary   Relevant Medications   EPINEPHrine (EPIPEN 2-PAK) 0.3 mg/0.3 mL IJ SOAJ injection   Odontogenic infection of jaw       Relevant Medications   clindamycin (CLEOCIN) 300  MG capsule   doxycycline (VIBRA-TABS) 100 MG tablet   oxyCODONE (ROXICODONE) 5 MG immediate release tablet       Meds ordered this encounter  Medications  . EPINEPHrine (EPIPEN 2-PAK) 0.3 mg/0.3 mL IJ SOAJ injection    Sig: Inject 0.3 mLs (0.3 mg total) into the muscle as needed for anaphylaxis. Use as directed for severe allergic reaction    Dispense:  2 each    Refill:  2    Pt will call  . doxycycline (VIBRA-TABS) 100 MG tablet    Sig: Take 1 tablet (100 mg total) by mouth 2 (two) times daily.    Dispense:  20 tablet    Refill:  0    Order Specific Question:   Supervising Provider    Answer:   Carlota Raspberry, JEFFREY R [[id number]]  . oxyCODONE (ROXICODONE) 5 MG immediate release tablet    Sig: Take 1 tablet (5 mg total) by mouth every 6 (six) hours as needed for severe pain.    Dispense:  15 tablet    Refill:  0    Order Specific Question:   Supervising Provider    Answer:   Carlota Raspberry, JEFFREY R [[id number]]   PLAN  Refill epi-pen for hx of anaphylaxis.  Given patient's recent clindamycin use without effect and hx of anaphylactic reactions to sulfa and penacillins, will give doxycycline with understanding that patient has faced some swelling in the past. She states this was not major and understands the risks in pursuing this course. She is fearful due to a history of a septic infection.  Given the intensity of her pain, a short course of oxycodone 5mg  PO q6h PRN supplemented with acetaminophen 500mg  PO q4h PRN may provide relief  Reviewed in depth ER precautions and the likelihood of further intervention if any distinct cyst has formed  Discussed with patient that this is a pursuit best followed up on by ENT and dentistry - she agrees that she will follow up with specialists  Patient encouraged to call clinic with any questions, comments, or concerns.  Maximiano Coss, NP

## 2020-05-03 ENCOUNTER — Encounter: Payer: Self-pay | Admitting: Registered Nurse

## 2020-05-26 ENCOUNTER — Telehealth: Payer: Self-pay | Admitting: Family Medicine

## 2020-05-26 NOTE — Telephone Encounter (Signed)
Pt is calling in to speak with Corene Cornea, you last 05/15/20 about there and has been awaiting to hear back from him . Gave message to Ansel Bong patient is wanting to hear back from someone soon    Number [phone]

## 2020-06-21 ENCOUNTER — Ambulatory Visit: Payer: BC Managed Care – PPO

## 2020-06-30 ENCOUNTER — Encounter: Payer: BC Managed Care – PPO | Admitting: Family Medicine

## 2020-07-11 ENCOUNTER — Telehealth: Payer: Self-pay | Admitting: *Deleted

## 2020-07-11 NOTE — Telephone Encounter (Signed)
Declined mobile mammo

## 2020-09-20 HISTORY — PX: TOTAL HIP ARTHROPLASTY: SHX124

## 2021-04-07 LAB — EXTERNAL GENERIC LAB PROCEDURE: COLOGUARD: NEGATIVE

## 2021-04-07 LAB — COLOGUARD: COLOGUARD: NEGATIVE

## 2021-04-12 ENCOUNTER — Encounter: Payer: Self-pay | Admitting: Allergy & Immunology

## 2021-04-12 ENCOUNTER — Other Ambulatory Visit: Payer: Self-pay

## 2021-04-12 ENCOUNTER — Ambulatory Visit: Payer: BC Managed Care – PPO | Admitting: Allergy & Immunology

## 2021-04-12 VITALS — BP 126/70 | HR 73 | Temp 97.9°F | Resp 20 | Ht 59.45 in | Wt 159.2 lb

## 2021-04-12 DIAGNOSIS — J4520 Mild intermittent asthma, uncomplicated: Secondary | ICD-10-CM | POA: Diagnosis not present

## 2021-04-12 DIAGNOSIS — T50905D Adverse effect of unspecified drugs, medicaments and biological substances, subsequent encounter: Secondary | ICD-10-CM | POA: Diagnosis not present

## 2021-04-12 DIAGNOSIS — T7800XD Anaphylactic reaction due to unspecified food, subsequent encounter: Secondary | ICD-10-CM

## 2021-04-12 DIAGNOSIS — J310 Chronic rhinitis: Secondary | ICD-10-CM | POA: Diagnosis not present

## 2021-04-12 NOTE — Progress Notes (Signed)
NEW PATIENT  Date of Service/Encounter:  04/12/21  Consult requested by: Jacelyn Pi, Lilia Argue, MD   Assessment:   Mild intermittent asthma, uncomplicated  Chronic rhinitis - s/p allergen immunotherapy with Dr. Shaune Leeks  Adverse effect of drug - needs Midol challenge to allow her to be prescribed a number of other blood pressure medications  Anaphylactic shock due to food  Plan/Recommendations:    1. Mild intermittent asthma, uncomplicated - Lung testing looks amazing. - I think it is fine without an albuterol inhaler.   2. Chronic rhinitis - Continue with the as needed use of antihistamines/allergy medications. - It seems that the allergy shots did well.   3. Adverse effect of drug - The Apap-pamabrom-pyrilamine is Midol. - You tolerate APAP (acetaminophen), so that is not the problem. - Bring in Midol for a challenge - This would help with figuring out the blood pressure medication that you can take. - Alternatively, maybe you do not need blood pressure medication at all.  4. Shellfish allergy - Testing today showed negative to shellfish mix as well as the individual shellfish allergens. - Copy of testing results provided today. - We are going to get lab testing to confirm since your reaction was so scary.  - We can talk about shellfish challenges after your results are back.   5. Return in about 4 weeks (around 05/10/2021) for [place].     This note in its entirety was forwarded to the Provider who requested this consultation.  Subjective:   Lauren Paul is a 55 y.o. female presenting today for evaluation of  Chief Complaint  Patient presents with   Allergic Rhinitis    Asthma   Food Intolerance    Lauren Paul has a history of the following: Patient Active Problem List   Diagnosis Date Noted   Pure hypercholesterolemia 08/06/2017   Anaphylactic shock, due to adverse effect of correct medicinal substance properly administered 07/23/2017    Multiple drug allergies 07/21/2017   Mild intermittent asthma without complication 50/27/7412   Other allergic rhinitis 07/21/2017   Spinal stenosis of lumbar region with neurogenic claudication 04/20/2017   Obesity 08/27/2016   OSA (obstructive sleep apnea) 08/12/2016   Glucose intolerance (impaired glucose tolerance) 08/06/2016   Menopausal hot flushes 08/06/2016   Essential hypertension, benign 06/19/2014    History obtained from: chart review and patient.  Lauren Paul was referred by Jacelyn Pi, Lilia Argue, MD.     Lauren Paul is a 55 y.o. female presenting for an evaluation of drug allergies and food allergies   Asthma/Respiratory Symptom History: She was taking something when she first saw Dr. Shaune Leeks. But she has not used her inhaler in years. She does not cough at night and never wheezes. Everything got better after the allergy shots. She has not had a problem with this at all since completing allergy shots.   Allergic Rhinitis Symptom History: She was on allergy shots years ago for two years total.  She does not take anything for her allergies routinely. She typically uses dye free Benadryl. She reacts to the dye in normal Benadryl including the gelcap form.   She is here today because she ate some pineapple and got tightness and itchy skin. This is the first time that she had this reaction to pineapple. She was previously tolerating this without any problems at all.   She also was diagnosed with hypertension and her PCP said that her current BP medication (HCTZ) is not working. However, everyday  try to  add contains Apap-pamabrom-pyrilamine (Midol). Her reaction included getting sick "physically" with her chest on "fire". This happened every time that she takes it. However she has not taken it in over ten years or so. She is open to a challenge to see if these symptoms occur again.   She has used acetaminophen by itself without any problems. She uses Aleve or Naprosyn instead since  it seems to control her symptoms more effectively and for a longer period of time. She is unsure if she has tolerated the other two ingredients in Midol, although from my reading it does not seem that we can acquire those by themselves anyway.   She also wants to check on shellfish and wants to see if she has outgrown that.  Otherwise, there is no history of other atopic diseases, including asthma, stinging insect allergies, eczema, urticaria, or contact dermatitis. There is no significant infectious history. Vaccinations are up to date.    Past Medical History: Patient Active Problem List   Diagnosis Date Noted   Pure hypercholesterolemia 08/06/2017   Anaphylactic shock, due to adverse effect of correct medicinal substance properly administered 07/23/2017   Multiple drug allergies 07/21/2017   Mild intermittent asthma without complication 10/23/7251   Other allergic rhinitis 07/21/2017   Spinal stenosis of lumbar region with neurogenic claudication 04/20/2017   Obesity 08/27/2016   OSA (obstructive sleep apnea) 08/12/2016   Glucose intolerance (impaired glucose tolerance) 08/06/2016   Menopausal hot flushes 08/06/2016   Essential hypertension, benign 06/19/2014    Medication List:  Allergies as of 04/12/2021       Reactions   Bee Venom Anaphylaxis   Benadryl [diphenhydramine Hcl] Shortness Of Breath   Pt states she can take Childrens Benadryl dye free fine.   Diphenhydramine Hcl Anaphylaxis, Swelling   Diphenhydramine Hcl Anaphylaxis, Shortness Of Breath, Swelling   Pt states she can take Childrens Benadryl dye free fine. Pt states she can take Childrens Benadryl dye free fine.   Doxycycline Swelling   Latex Swelling   Peanut (diagnostic) Anaphylaxis, Hives, Itching, Rash   Penicillins Anaphylaxis   Has patient had a PCN reaction causing immediate rash, facial/tongue/throat swelling, SOB or lightheadedness with hypotension: Yes Has patient had a PCN reaction causing severe rash  involving mucus membranes or skin necrosis: No Has patient had a PCN reaction that required hospitalization: Unknown Has patient had a PCN reaction occurring within the last 10 years: No If all of the above answers are "NO", then may proceed with Cephalosporin use.   Apap-pamabrom-pyrilamine    Betadine [povidone Iodine]    Iodides    Losartan Other (See Comments)   LEG CRAMPS   Olmesartan    Leg cramp   Other    Watermelon- Anaphylaxis Nuts Melon   Shellfish Allergy    Sulfa Antibiotics    Valsartan    Fatigue and alopecia        Medication List        Accurate as of April 12, 2021  4:44 PM. If you have any questions, ask your nurse or doctor.          STOP taking these medications    clindamycin 300 MG capsule Commonly known as: CLEOCIN Stopped by: Valentina Shaggy, MD   doxycycline 100 MG tablet Commonly known as: VIBRA-TABS Stopped by: Valentina Shaggy, MD   oxyCODONE 5 MG immediate release tablet Commonly known as: Roxicodone Stopped by: Valentina Shaggy, MD   SF 5000 Plus 1.1 % Crea dental cream Generic  drug: sodium fluoride Stopped by: Valentina Shaggy, MD   triamcinolone cream 0.1 % Commonly known as: KENALOG Stopped by: Valentina Shaggy, MD       TAKE these medications    amitriptyline 10 MG tablet Commonly known as: ELAVIL Take by mouth.   EPINEPHrine 0.3 mg/0.3 mL Soaj injection Commonly known as: EpiPen 2-Pak Inject 0.3 mLs (0.3 mg total) into the muscle as needed for anaphylaxis. Use as directed for severe allergic reaction   escitalopram 20 MG tablet Commonly known as: LEXAPRO Take 1 tablet (20 mg total) by mouth daily.   hydrochlorothiazide 12.5 MG tablet Commonly known as: HYDRODIURIL Take 1 tablet (12.5 mg total) by mouth daily.   Solo Tabs Take by mouth.   traZODone 50 MG tablet Commonly known as: DESYREL Take 1-2 tablets (50-100 mg total) by mouth at bedtime as needed. for sleep        Birth  History: non-contributory  Developmental History: non-contributory  Past Surgical History: Past Surgical History:  Procedure Laterality Date   ABLATION     DILATION AND CURETTAGE OF UTERUS     TOTAL HIP ARTHROPLASTY Left 09/2020   TUBAL LIGATION       Family History: Family History  Problem Relation Age of Onset   Bladder Cancer Mother 63   Uterine cancer Mother    Cancer Mother 71       Uterine and bladder cancer   Hypertension Mother    Multiple sclerosis Mother    Lung cancer Father    Cancer Father 80       lung cancer   Hypertension Sister    Allergic rhinitis Neg Hx    Angioedema Neg Hx    Asthma Neg Hx    Eczema Neg Hx    Immunodeficiency Neg Hx    Urticaria Neg Hx      Social History: Lauren Paul lives at home with her family.  She is in a house that is 54 years old.  There is carpeting and vinyl throughout the home.  They have a heat pump for heating as well as gas heating with central cooling.  There are cats and dogs outside of the home and cats inside of the home.  There are dust mite covers on the bed, but not the pillows.  There is no tobacco exposure.  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  She is not exposed to fumes, chemicals, or dust.  She does not use a HEPA filter.  She does not live near an interstate or industrial area.   Review of Systems  Constitutional: Negative.  Negative for chills, fever, malaise/fatigue and weight loss.  HENT: Negative.  Negative for congestion, ear discharge and ear pain.   Eyes:  Negative for pain, discharge and redness.  Respiratory:  Negative for cough, sputum production, shortness of breath and wheezing.   Cardiovascular: Negative.  Negative for chest pain and palpitations.  Gastrointestinal:  Negative for abdominal pain, heartburn, nausea and vomiting.  Skin: Negative.  Negative for itching and rash.  Neurological:  Negative for dizziness and headaches.  Endo/Heme/Allergies:  Positive for  environmental allergies. Does not bruise/bleed easily.       Positive for food allergies. Positive for multiple drug allergies.      Objective:   Blood pressure 126/70, pulse 73, temperature 97.9 F (36.6 C), temperature source Temporal, resp. rate 20, height 4' 11.45" (1.51 m), weight 159 lb 3.2 oz (72.2 kg), SpO2 99 %. Body mass  index is 31.67 kg/m.   Physical Exam:   Physical Exam Constitutional:      Appearance: She is well-developed.  HENT:     Head: Normocephalic and atraumatic.     Right Ear: Tympanic membrane, ear canal and external ear normal. No drainage, swelling or tenderness. Tympanic membrane is not injected, scarred, erythematous, retracted or bulging.     Left Ear: Tympanic membrane, ear canal and external ear normal. No drainage, swelling or tenderness. Tympanic membrane is not injected, scarred, erythematous, retracted or bulging.     Nose: No nasal deformity, septal deviation, mucosal edema or rhinorrhea.     Right Turbinates: Enlarged and swollen.     Left Turbinates: Enlarged and swollen.     Right Sinus: No maxillary sinus tenderness or frontal sinus tenderness.     Left Sinus: No maxillary sinus tenderness or frontal sinus tenderness.     Mouth/Throat:     Mouth: Mucous membranes are not pale and not dry.     Pharynx: Uvula midline.  Eyes:     General:        Right eye: No discharge.        Left eye: No discharge.     Conjunctiva/sclera: Conjunctivae normal.     Right eye: Right conjunctiva is not injected. No chemosis.    Left eye: Left conjunctiva is not injected. No chemosis.    Pupils: Pupils are equal, round, and reactive to light.  Cardiovascular:     Rate and Rhythm: Normal rate and regular rhythm.     Heart sounds: Normal heart sounds.  Pulmonary:     Effort: Pulmonary effort is normal. No tachypnea, accessory muscle usage or respiratory distress.     Breath sounds: Normal breath sounds. No wheezing, rhonchi or rales.     Comments: Moving  air well in all lung fields. No increased work of breathing noted.  Chest:     Chest wall: No tenderness.  Abdominal:     Tenderness: There is no abdominal tenderness. There is no guarding or rebound.  Lymphadenopathy:     Head:     Right side of head: No submandibular, tonsillar or occipital adenopathy.     Left side of head: No submandibular, tonsillar or occipital adenopathy.     Cervical: No cervical adenopathy.  Skin:    General: Skin is warm.     Capillary Refill: Capillary refill takes less than 2 seconds.     Coloration: Skin is not pale.     Findings: No abrasion, erythema, petechiae or rash. Rash is not papular, urticarial or vesicular.     Comments: No eczematous or urticarial lesions noted.   Neurological:     Mental Status: She is alert.  Psychiatric:        Behavior: Behavior is cooperative.     Diagnostic studies:    Spirometry: results normal (FEV1: 2.00/103%, FVC: 2.50/105%, FEV1/FVC: 80%).    Spirometry consistent with normal pattern.   Allergy Studies:     Food Adult Perc - 04/12/21 1500     Time Antigen Placed 0300    Allergen Manufacturer Lavella Hammock    Location Back    Number of allergen test 9     Control-buffer 50% Glycerol Negative    Control-Histamine 1 mg/ml 2+    8. Shellfish Mix Negative    25. Shrimp Negative    26. Crab Negative    27. Lobster Negative    28. Oyster Negative    29. Scallops Negative  63. Pineapple Negative             Allergy testing results were read and interpreted by myself, documented by clinical staff.         Salvatore Marvel, MD [facility]

## 2021-04-12 NOTE — Patient Instructions (Signed)
1. Mild intermittent asthma, uncomplicated - Lung testing looks amazing. - I think it is fine without an albuterol inhaler.   2. Chronic rhinitis - Continue with the as needed use of antihistamines/allergy medications. - It seems that the allergy shots did well.   3. Adverse effect of drug - The Apap-pamabrom-pyrilamine is Midol. - You tolerate APAP (acetaminophen), so that is not the problem. - Bring in Midol for a challenge - This would help with figuring out the blood pressure medication that you can take. - Alternatively, maybe you do not need blood pressure medication at all.  4. Shellfish allergy - Testing today showed negative to shellfish mix as well as the individual shellfish allergens. - Copy of testing results provided today. - We are going to get lab testing to confirm since your reaction was so scary.  - We can talk about shellfish challenges after your results are back.   5. Return in about 4 weeks (around 05/10/2021) for [place].    Please inform us of any Emergency Department visits, hospitalizations, or changes in symptoms. Call us before going to the ED for breathing or allergy symptoms since we might be able to fit you in for a sick visit. Feel free to contact us anytime with any questions, problems, or concerns.  It was a pleasure to meet you today!  Websites that have reliable patient information: 1. American Academy of Asthma, Allergy, and Immunology: www.aaaai.org 2. Food Allergy Research and Education (FARE): foodallergy.org 3. Mothers of Asthmatics: http://www.asthmacommunitynetwork.org 4. American College of Allergy, Asthma, and Immunology: www.acaai.org   COVID-19 Vaccine Information can be found at: ShippingScam.co.uk For questions related to vaccine distribution or appointments, please email [email or web address] or call [phone].   We realize that you might be concerned about  having an allergic reaction to the COVID19 vaccines. To help with that concern, WE ARE OFFERING THE COVID19 VACCINES IN OUR OFFICE! Ask the front desk for dates!     "Like" [place] on Facebook and Instagram for our latest updates!      A healthy democracy works best when [employer] participate! Make sure you are registered to vote! If you have moved or changed any of your contact information, you will need to get this updated before voting!  In some cases, you MAY be able to register to vote online: [email or web address]    8. Shellfish Mix Negative   25. Shrimp Negative   26. Crab Negative   27. Lobster Negative   28. Oyster Negative   29. Scallops Negative   63. Pineapple Negative

## 2021-04-15 ENCOUNTER — Encounter: Payer: Self-pay | Admitting: Allergy & Immunology

## 2021-04-17 LAB — ALLERGEN PROFILE, SHELLFISH
Clam IgE: <0.1 kU/L
F023-IgE Crab: <0.1 kU/L
F080-IgE Lobster: <0.1 kU/L
F290-IgE Oyster: <0.1 kU/L
Scallop IgE: <0.1 kU/L
Shrimp IgE: <0.1 kU/L

## 2021-05-24 ENCOUNTER — Encounter: Payer: BC Managed Care – PPO | Admitting: Family

## 2021-06-07 ENCOUNTER — Other Ambulatory Visit: Payer: Self-pay

## 2021-06-07 ENCOUNTER — Ambulatory Visit: Payer: BC Managed Care – PPO | Admitting: Family

## 2021-06-07 ENCOUNTER — Encounter: Payer: Self-pay | Admitting: Family

## 2021-06-07 VITALS — BP 150/80 | HR 94 | Temp 97.7°F | Resp 17 | Wt 158.0 lb

## 2021-06-07 DIAGNOSIS — J4520 Mild intermittent asthma, uncomplicated: Secondary | ICD-10-CM | POA: Diagnosis not present

## 2021-06-07 DIAGNOSIS — T7800XD Anaphylactic reaction due to unspecified food, subsequent encounter: Secondary | ICD-10-CM | POA: Diagnosis not present

## 2021-06-07 NOTE — Patient Instructions (Addendum)
Continue to avoid shellfish. In case of an allergic reaction, give dye free Benadryl 4 teaspoonfuls every 4 hours, and if life-threatening symptoms occur, inject with EpiPen 0.3 mg.  Keep already scheduled appointment on June 21, 2021 at 1:30 PM for Midol oral challenge.  Remember to remain off all antihistamines 3 days prior to this appointment.  This appointment can last approximately 2 to 3 hours.

## 2021-06-07 NOTE — Progress Notes (Addendum)
[address] [place] [address] Dept: [phone]  FOLLOW UP NOTE  Patient ID: Lauren Paul, female    DOB: October 23, 1965  Age: 55 y.o. MRN: [id number] Date of Office Visit: 06/07/2021  Assessment  Chief Complaint: Follow-up (Pt states that overall asthma symptoms have been well managed.) and Food/Drug Challenge (Shrimp Challenge)  HPI Lauren Paul is a 55 year old female who presents today for an oral food challenge to shrimp.  She was last seen on April 12, 2021 by Dr. Ernst Bowler for mild intermittent asthma, chronic rhinitis, adverse effect of drug, and shellfish allergy.  She reports that when she was in her mid 21s she had shrimp and developed facial swelling and a tight throat.  The symptoms did not start until the next day.  She reports that prior to that she had been able to eat shrimp without any problems.  She is in good health today and denies any cutaneous, cardiorespiratory, and gastrointestinal symptoms.  She has been off all antihistamines 3 days prior.  All questions were answered and informed consent was signed.   Drug Allergies:  Allergies  Allergen Reactions   Bee Venom Anaphylaxis   Benadryl [Diphenhydramine Hcl] Shortness Of Breath    Pt states she can take Childrens Benadryl dye free fine.   Cantaloupe Extract Allergy Skin Test Anaphylaxis   Citrullus Vulgaris Anaphylaxis   Diphenhydramine Hcl Anaphylaxis and Swelling   Diphenhydramine Hcl Anaphylaxis, Shortness Of Breath and Swelling    Pt states she can take Childrens Benadryl dye free fine. Pt states she can take Childrens Benadryl dye free fine.   Doxycycline Swelling   Latex Swelling   Peanut (Diagnostic) Anaphylaxis, Hives, Itching and Rash   Penicillins Anaphylaxis    Has patient had a PCN reaction causing immediate rash, facial/tongue/throat swelling, SOB or lightheadedness with hypotension: Yes Has patient had a PCN reaction causing severe rash involving mucus membranes or skin necrosis:  No Has patient had a PCN reaction that required hospitalization: Unknown Has patient had a PCN reaction occurring within the last 10 years: No If all of the above answers are "NO", then may proceed with Cephalosporin use.    Apap-Pamabrom-Pyrilamine    Betadine [Povidone Iodine]    Iodides    Losartan Other (See Comments)    LEG CRAMPS   Olmesartan     Leg cramp    Other     Watermelon- Anaphylaxis Nuts Melon   Shellfish Allergy    Sulfa Antibiotics    Valsartan     Fatigue and alopecia    Review of Systems: Review of Systems  Constitutional:  Negative for chills and fever.  HENT:         Denies rhinorrhea, nasal congestion, and postnasal drip  Eyes:        Denies itchy watery eyes  Respiratory:  Negative for cough, shortness of breath and wheezing.   Cardiovascular:  Negative for chest pain and palpitations.  Gastrointestinal:  Negative for abdominal pain, diarrhea, nausea and vomiting.  Genitourinary:  Negative for dysuria.  Skin:  Negative for itching and rash.  Neurological:  Negative for headaches.    Physical Exam: BP (!) 150/80   Pulse 94   Temp 97.7 F (36.5 C) (Temporal)   Resp 17   Wt 158 lb (71.7 kg)   SpO2 99%   BMI 31.43 kg/m    Physical Exam Constitutional:      Appearance: Normal appearance.  HENT:     Head: Normocephalic and atraumatic.  Comments: Pharynx normal, eyes normal, ears normal, nose normal    Right Ear: Tympanic membrane, ear canal and external ear normal.     Left Ear: Tympanic membrane, ear canal and external ear normal.     Nose: Nose normal.     Mouth/Throat:     Mouth: Mucous membranes are moist.     Pharynx: Oropharynx is clear.  Eyes:     Conjunctiva/sclera: Conjunctivae normal.  Cardiovascular:     Rate and Rhythm: Regular rhythm.     Heart sounds: Normal heart sounds.  Pulmonary:     Effort: Pulmonary effort is normal.     Breath sounds: Normal breath sounds.     Comments: Lungs clear to  auscultation Musculoskeletal:     Cervical back: Neck supple.  Skin:    General: Skin is warm.     Comments: No rashes or urticarial lesions noted  Neurological:     Mental Status: She is alert and oriented to person, place, and time.  Psychiatric:        Mood and Affect: Mood normal.        Behavior: Behavior normal.        Thought Content: Thought content normal.        Judgment: Judgment normal.    Diagnostics: FVC 2.50 L(105%), FEV1 1.96 L(102%).  Predicted FVC 2.38 L, predicted FEV1 1.93 L.  Spirometry indicates normal respiratory function.  Open graded shrimp oral challenge: The patient was not able to tolerate the challenge today without adverse signs or symptoms. Vital signs were stable throughout the challenge and observation period. She received multiple doses separated by 15 minutes, each of which was separated by vitals and a brief physical exam. She received the following doses: lip rub, 1 gm,  and 2 gm.  At 2:46 PM she complained of itching around her mouth, chest, and arms.No visible rash or urticarial lesions noted. She also reports some difficulty swallowing and upper abdominal pain.Her exam was normal. At 3:01 PM she was given 4 teaspoonfuls of dye free Benadryl.At 3:20 PM she complained of a pain/dull pressure in the center of her chest. She also reports that her tongue feels swollen. Normal exam. No swelling/edema noted. At 3:27 PM she reports that her swallowing is getting better and that the pressure/pain in her chest is starting to ease off.4:01 PM she reports that her itching is better, swallowing is better and the pain/pressure in the center of her chest felt better. She just felt "blah".Discharged at 4:07 PM and her friend Marzetta Board was going to drive her home. She was monitored for 60 minutes following the last dose.   The patient had negative skin prick test and sIgE tests to shrimp  and was not able to tolerate the open graded oral challenge   Assessment and Plan: 1.  Anaphylactic shock due to food, subsequent encounter     No orders of the defined types were placed in this encounter.   Patient Instructions  Continue to avoid shellfish. In case of an allergic reaction, give dye free Benadryl 4 teaspoonfuls every 4 hours, and if life-threatening symptoms occur, inject with EpiPen 0.3 mg.  Keep already scheduled appointment on June 21, 2021 at 1:30 PM for Midol oral challenge.  Remember to remain off all antihistamines 3 days prior to this appointment.  This appointment can last approximately 2 to 3 hours.   Return in about 2 weeks (around 06/21/2021), or if symptoms worsen or fail to improve.    Thank you  for the opportunity to care for this patient.  Please do not hesitate to contact me with questions.  Althea Charon, FNP [facility]

## 2021-06-08 NOTE — Addendum Note (Signed)
Addended by: Katherina Right D on: 06/08/2021 01:53 PM   Modules accepted: Orders

## 2021-06-21 ENCOUNTER — Encounter: Payer: BC Managed Care – PPO | Admitting: Family

## 2022-05-25 ENCOUNTER — Emergency Department (HOSPITAL_BASED_OUTPATIENT_CLINIC_OR_DEPARTMENT_OTHER)
Admission: EM | Admit: 2022-05-25 | Discharge: 2022-05-25 | Disposition: A | Discharge status: Home / Self Care | Payer: BC Managed Care – PPO | Attending: Emergency Medicine | Admitting: Emergency Medicine

## 2022-05-25 ENCOUNTER — Emergency Department (HOSPITAL_BASED_OUTPATIENT_CLINIC_OR_DEPARTMENT_OTHER): Payer: BC Managed Care – PPO

## 2022-05-25 ENCOUNTER — Other Ambulatory Visit: Payer: Self-pay

## 2022-05-25 DIAGNOSIS — W231XXA Caught, crushed, jammed, or pinched between stationary objects, initial encounter: Secondary | ICD-10-CM | POA: Insufficient documentation

## 2022-05-25 DIAGNOSIS — M79644 Pain in right finger(s): Secondary | ICD-10-CM | POA: Diagnosis present

## 2022-05-25 DIAGNOSIS — Z91040 Latex allergy status: Secondary | ICD-10-CM | POA: Insufficient documentation

## 2022-05-25 MED ORDER — NAPROXEN 250 MG PO TABS
375.0000 mg | ORAL_TABLET | Freq: Once | ORAL | Status: AC
  Administered 2022-05-25: 375 mg via ORAL
  Filled 2022-05-25: qty 2

## 2022-05-25 NOTE — ED Notes (Signed)
Patient alert x 4 . Rt index finger swollen unsure if she jammed  it. Limited movement some swelling. Hurting since wednesday

## 2022-05-25 NOTE — ED Provider Notes (Signed)
[facility] Provider Note   CSN: [id number] Arrival date & time: 05/25/22  1659     History  Chief Complaint  Patient presents with   Hand Pain    Lauren Paul is a 56 y.o. female presenting with right middle finger pain.  Reports that started earlier this week.  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  No numbness or tingling.  Has not tried any medications at home.  No history of diabetes.   Hand Pain       Home Medications Prior to Admission medications   Medication Sig Start Date End Date Taking? Authorizing Provider  amitriptyline (ELAVIL) 10 MG tablet Take by mouth. 03/26/21   [provider]  cloNIDine (CATAPRES - DOSED IN MG/24 HR) 0.1 mg/24hr patch 0.1 mg once a week. 05/17/21   [provider]  EPINEPHrine (EPIPEN 2-PAK) 0.3 mg/0.3 mL IJ SOAJ injection Inject 0.3 mLs (0.3 mg total) into the muscle as needed for anaphylaxis. Use as directed for severe allergic reaction 04/14/20   Maximiano Coss, NP  escitalopram (LEXAPRO) 20 MG tablet Take 1 tablet (20 mg total) by mouth daily. 03/27/20   Jacelyn Pi, Lilia Argue, MD  hydrochlorothiazide (HYDRODIURIL) 12.5 MG tablet Take 1 tablet (12.5 mg total) by mouth daily. 03/27/20   Daleen Squibb, MD  Multiple Vitamins-Minerals (SOLO) TABS Take by mouth.    [provider]  traZODone (DESYREL) 50 MG tablet Take 1-2 tablets (50-100 mg total) by mouth at bedtime as needed. for sleep 03/27/20   Jacelyn Pi, Lilia Argue, MD      Allergies    Bee venom, Benadryl [diphenhydramine hcl], Cantaloupe extract allergy skin test, Citrullus vulgaris, Diphenhydramine hcl, Diphenhydramine hcl, Doxycycline, Latex, Peanut (diagnostic), Penicillins, Apap-pamabrom-pyrilamine, Betadine [povidone iodine], Iodides, Losartan, Olmesartan, Other, Shellfish allergy, Sulfa antibiotics, and Valsartan     Review of Systems   Review of Systems  Physical Exam Updated Vital Signs BP (!) 167/95   Pulse 62   Temp 98.1 F (36.7 C) (Oral)   Resp 18   Ht '4\' 11"'$  (1.499 m)   Wt 64.4 kg   SpO2 100%   BMI 28.68 kg/m  Physical Exam Vitals and nursing note reviewed.  Constitutional:      Appearance: Normal appearance.  HENT:     Head: Normocephalic and atraumatic.  Eyes:     General: No scleral icterus.    Conjunctiva/sclera: Conjunctivae normal.  Pulmonary:     Effort: Pulmonary effort is normal. No respiratory distress.  Musculoskeletal:     Comments: Full range of motion of right middle finger.  No trigger finger.  No swelling, felon or paronychia.  Skin:    Findings: No rash.  Neurological:     Mental Status: She is alert.  Psychiatric:        Mood and Affect: Mood normal.     ED Results / Procedures / Treatments   Labs (all labs ordered are listed, but only abnormal results are displayed) Labs Reviewed - No data to display  EKG None  Radiology DG Finger Middle Right  Result Date: 05/25/2022 CLINICAL DATA:  Pain and swelling for 4 days, denies injury EXAM: RIGHT MIDDLE FINGER 2+V COMPARISON:  None Available. FINDINGS: There is no evidence of fracture or dislocation. There is no evidence of arthropathy or other focal bone abnormality. Soft tissues are unremarkable. IMPRESSION: No fracture or dislocation of  the right middle finger. Electronically Signed   By: Delanna Ahmadi M.D.   On: 05/25/2022 17:39    Procedures Procedures   Medications Ordered in ED Medications  naproxen (NAPROSYN) tablet 375 mg (375 mg Oral Given 05/25/22 1806)    ED Course/ Medical Decision Making/ A&P                           Medical Decision Making Amount and/or Complexity of Data Reviewed Radiology: ordered.  Risk Prescription drug management.   56 year old female presenting with pain to the right middle digit.  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  Physical exam: No trigger finger, full range of motion.  Not consistent with tenosynovitis.  No swan-neck or boutonniere deformities.  Treatment: Given Aleve  MDM/disposition: Patient is not immunocompromise.  She does not appear to have any emergent causes of her finger pain.  Suspect overuse injury.  Will be treated with NSAIDs and given a finger splint.  She is agreeable to the plan.  Return precautions discussed.   Final Clinical Impression(s) / ED Diagnoses Final diagnoses:  Pain of right middle finger    Rx / DC Orders ED Discharge Orders     None      Results and diagnoses were explained to the patient. Return precautions discussed in full. Patient had no additional questions and expressed complete understanding.   This chart was dictated using voice recognition software.  Despite best efforts to proofread,  errors can occur which can change the documentation meaning.    Rhae Hammock, PA-C 05/25/22 [place], Ankit, MD 05/25/22 [phone]

## 2022-05-25 NOTE — ED Triage Notes (Signed)
Pt arrives pov, steady gait c/o right middle finger swelling x 4 days. Unknown injury

## 2022-05-25 NOTE — Discharge Instructions (Addendum)
Follow-up with your PCP later next week if your symptoms are not improving.  You may use naproxen for your pain and swelling.  Ice is a good option.   It was a letter to meet you today and I hope you feel better!

## 2023-12-29 ENCOUNTER — Other Ambulatory Visit: Payer: Self-pay | Admitting: Orthopedic Surgery

## 2023-12-29 NOTE — Patient Instructions (Addendum)
 SURGICAL WAITING ROOM VISITATION  Patients having surgery or a procedure may have no more than 2 support people in the waiting area - these visitors may rotate.    Children under the age of 82 must have an adult with them who is not the patient.  Due to an increase in RSV and influenza rates and associated hospitalizations, children ages 38 and under may not visit patients in [facility] hospitals.  Visitors with respiratory illnesses are discouraged from visiting and should remain at home.  If the patient needs to stay at the hospital during part of their recovery, the visitor guidelines for inpatient rooms apply. Pre-op nurse will coordinate an appropriate time for 1 support person to accompany patient in pre-op.  This support person may not rotate.    Please refer to the [facility] website for the visitor guidelines for Inpatients (after your surgery is over and you are in a regular room).       Your procedure is scheduled on:  01/12/24    Report to [facility] Main Entrance    Report to admitting at   [phone]   Call this number if you have problems the morning of surgery [phone]   Do not eat food :After Midnight.   After Midnight you may have the following liquids until __ 0645____ AM DAY OF SURGERY  Water Non-Citrus Juices (without pulp, NO RED-Apple, White grape, White cranberry) Black Coffee (NO MILK/CREAM OR CREAMERS, sugar ok)  Clear Tea (NO MILK/CREAM OR CREAMERS, sugar ok) regular and decaf                             Plain Jell-O (NO RED)                                           Fruit ices (not with fruit pulp, NO RED)                                     Popsicles (NO RED)                                                               Sports drinks like Gatorade (NO RED)                    The day of surgery:  Drink ONE (1) Pre-Surgery Clear Ensure or G2 at  [phone]  ( have completed by ) the morning of surgery. Drink in one sitting. Do not sip.   This drink was given to you during your hospital  pre-op appointment visit. Nothing else to drink after completing the  Pre-Surgery Clear Ensure or G2.          If you have questions, please contact your surgeon's office.       Oral Hygiene is also important to reduce your risk of infection.  Remember - BRUSH YOUR TEETH THE MORNING OF SURGERY WITH YOUR REGULAR TOOTHPASTE  DENTURES WILL BE REMOVED PRIOR TO SURGERY PLEASE DO NOT APPLY "Poly grip" OR ADHESIVES!!!   Do NOT smoke after Midnight   Stop all vitamins and herbal supplements 7 days before surgery.   Take these medicines the morning of surgery with A SIP OF WATER:   lexapro, clonidine patch if due to place   DO NOT TAKE ANY ORAL DIABETIC MEDICATIONS DAY OF YOUR SURGERY  Bring CPAP mask and tubing day of surgery.                              You may not have any metal on your body including hair pins, jewelry, and body piercing             Do not wear make-up, lotions, powders, perfumes/cologne, or deodorant  Do not wear nail polish including gel and S&S, artificial/acrylic nails, or any other type of covering on natural nails including finger and toenails. If you have artificial nails, gel coating, etc. that needs to be removed by a nail salon please have this removed prior to surgery or surgery may need to be canceled/ delayed if the surgeon/ anesthesia feels like they are unable to be safely monitored.   Do not shave  48 hours prior to surgery.               Men may shave face and neck.   Do not bring valuables to the hospital. [facility] IS NOT             RESPONSIBLE   FOR VALUABLES.   Contacts, glasses, dentures or bridgework may not be worn into surgery.   Bring small overnight bag day of surgery.   DO NOT BRING YOUR HOME MEDICATIONS TO THE HOSPITAL. PHARMACY WILL DISPENSE MEDICATIONS LISTED ON YOUR MEDICATION LIST TO YOU DURING YOUR ADMISSION IN THE HOSPITAL!    Patients  discharged on the day of surgery will not be allowed to drive home.  Someone NEEDS to stay with you for the first 24 hours after anesthesia.   Special Instructions: Bring a copy of your healthcare power of attorney and living will documents the day of surgery if you haven't scanned them before.              Please read over the following fact sheets you were given: IF YOU HAVE QUESTIONS ABOUT YOUR PRE-OP INSTRUCTIONS PLEASE CALL [phone]   If you received a COVID test during your pre-op visit  it is requested that you wear a mask when out in public, stay away from anyone that may not be feeling well and notify your surgeon if you develop symptoms. If you test positive for Covid or have been in contact with anyone that has tested positive in the last 10 days please notify you surgeon.      Pre-operative 5 CHG Bath Instructions   You can play a key role in reducing the risk of infection after surgery. Your skin needs to be as free of germs as possible. You can reduce the number of germs on your skin by washing with CHG (chlorhexidine gluconate) soap before surgery. CHG is an antiseptic soap that kills germs and continues to kill germs even after washing.   DO NOT use if you have an allergy to chlorhexidine/CHG or antibacterial soaps. If your skin becomes reddened or irritated, stop using the CHG  and notify one of our RNs at [phone].   Please shower with the CHG soap starting 4 days before surgery using the following schedule:     Please keep in mind the following:  DO NOT shave, including legs and underarms, starting the day of your first shower.   You may shave your face at any point before/day of surgery.  Place clean sheets on your bed the day you start using CHG soap. Use a clean washcloth (not used since being washed) for each shower. DO NOT sleep with pets once you start using the CHG.   CHG Shower Instructions:  If you choose to wash your hair and private area, wash first  with your normal shampoo/soap.  After you use shampoo/soap, rinse your hair and body thoroughly to remove shampoo/soap residue.  Turn the water OFF and apply about 3 tablespoons (45 ml) of CHG soap to a CLEAN washcloth.  Apply CHG soap ONLY FROM YOUR NECK DOWN TO YOUR TOES (washing for 3-5 minutes)  DO NOT use CHG soap on face, private areas, open wounds, or sores.  Pay special attention to the area where your surgery is being performed.  If you are having back surgery, having someone wash your back for you may be helpful. Wait 2 minutes after CHG soap is applied, then you may rinse off the CHG soap.  Pat dry with a clean towel  Put on clean clothes/pajamas   If you choose to wear lotion, please use ONLY the CHG-compatible lotions on the back of this paper.     Additional instructions for the day of surgery: DO NOT APPLY any lotions, deodorants, cologne, or perfumes.   Put on clean/comfortable clothes.  Brush your teeth.  Ask your nurse before applying any prescription medications to the skin.      CHG Compatible Lotions   Aveeno Moisturizing lotion  Cetaphil Moisturizing Cream  Cetaphil Moisturizing Lotion  Clairol Herbal Essence Moisturizing Lotion, Dry Skin  Clairol Herbal Essence Moisturizing Lotion, Extra Dry Skin  Clairol Herbal Essence Moisturizing Lotion, Normal Skin  Curel Age Defying Therapeutic Moisturizing Lotion with Alpha Hydroxy  Curel Extreme Care Body Lotion  Curel Soothing Hands Moisturizing Hand Lotion  Curel Therapeutic Moisturizing Cream, Fragrance-Free  Curel Therapeutic Moisturizing Lotion, Fragrance-Free  Curel Therapeutic Moisturizing Lotion, Original Formula  Eucerin Daily Replenishing Lotion  Eucerin Dry Skin Therapy Plus Alpha Hydroxy Crme  Eucerin Dry Skin Therapy Plus Alpha Hydroxy Lotion  Eucerin Original Crme  Eucerin Original Lotion  Eucerin Plus Crme Eucerin Plus Lotion  Eucerin TriLipid Replenishing Lotion  Keri Anti-Bacterial Hand  Lotion  Keri Deep Conditioning Original Lotion Dry Skin Formula Softly Scented  Keri Deep Conditioning Original Lotion, Fragrance Free Sensitive Skin Formula  Keri Lotion Fast Absorbing Fragrance Free Sensitive Skin Formula  Keri Lotion Fast Absorbing Softly Scented Dry Skin Formula  Keri Original Lotion  Keri Skin Renewal Lotion Keri Silky Smooth Lotion  Keri Silky Smooth Sensitive Skin Lotion  Nivea Body Creamy Conditioning Oil  Nivea Body Extra Enriched Teacher, adult education Moisturizing Lotion Nivea Crme  Nivea Skin Firming Lotion  NutraDerm 30 Skin Lotion  NutraDerm Skin Lotion  NutraDerm Therapeutic Skin Cream  NutraDerm Therapeutic Skin Lotion  ProShield Protective Hand Cream  Provon moisturizing lotion

## 2023-12-29 NOTE — Progress Notes (Signed)
 Anesthesia Review:  PCP: Cardiologist :  PPM/ ICD: Device Orders: Rep Notified:  Chest x-ray : EKG : Echo : Stress test: Cardiac Cath :   Activity level:  Sleep Study/ CPAP : Fasting Blood Sugar :      / Checks Blood Sugar -- times a day:    Blood Thinner/ Instructions /Last Dose: ASA / Instructions/ Last Dose :    Latex Akllergy   Vancomycin

## 2023-12-31 ENCOUNTER — Other Ambulatory Visit: Payer: Self-pay

## 2023-12-31 ENCOUNTER — Encounter (HOSPITAL_COMMUNITY): Payer: Self-pay

## 2023-12-31 ENCOUNTER — Encounter (HOSPITAL_COMMUNITY)
Admission: RE | Admit: 2023-12-31 | Discharge: 2023-12-31 | Disposition: A | Discharge status: Home / Self Care | Source: Ambulatory Visit | Attending: Orthopedic Surgery | Admitting: Orthopedic Surgery

## 2023-12-31 VITALS — BP 149/96 | HR 57 | Temp 98.3°F | Resp 16 | Ht 59.0 in | Wt 153.0 lb

## 2023-12-31 DIAGNOSIS — R7303 Prediabetes: Secondary | ICD-10-CM | POA: Insufficient documentation

## 2023-12-31 DIAGNOSIS — I471 Supraventricular tachycardia, unspecified: Secondary | ICD-10-CM | POA: Insufficient documentation

## 2023-12-31 DIAGNOSIS — G4733 Obstructive sleep apnea (adult) (pediatric): Secondary | ICD-10-CM | POA: Insufficient documentation

## 2023-12-31 DIAGNOSIS — Z01818 Encounter for other preprocedural examination: Secondary | ICD-10-CM | POA: Insufficient documentation

## 2023-12-31 DIAGNOSIS — M1611 Unilateral primary osteoarthritis, right hip: Secondary | ICD-10-CM | POA: Insufficient documentation

## 2023-12-31 DIAGNOSIS — I10 Essential (primary) hypertension: Secondary | ICD-10-CM | POA: Diagnosis not present

## 2023-12-31 DIAGNOSIS — I493 Ventricular premature depolarization: Secondary | ICD-10-CM | POA: Insufficient documentation

## 2023-12-31 HISTORY — DX: Sleep apnea, unspecified: G47.30

## 2023-12-31 HISTORY — DX: Prediabetes: R73.03

## 2023-12-31 LAB — BASIC METABOLIC PANEL
Anion gap: 10 (ref 5–15)
BUN: 12 mg/dL (ref 6–20)
CO2: 25 mmol/L (ref 22–32)
Calcium: 9.9 mg/dL (ref 8.9–10.3)
Chloride: 103 mmol/L (ref 98–111)
Creatinine, Ser: 0.79 mg/dL (ref 0.44–1.00)
GFR, Estimated: >60 mL/min (ref >60)
Glucose, Bld: 85 mg/dL (ref 70–99)
Potassium: 3.6 mmol/L (ref 3.5–5.1)
Sodium: 138 mmol/L (ref 135–145)

## 2023-12-31 LAB — CBC
HCT: 38.9 % (ref 36.0–46.0)
Hemoglobin: 12.8 g/dL (ref 12.0–15.0)
MCH: 29.2 pg (ref 26.0–34.0)
MCHC: 32.9 g/dL (ref 30.0–36.0)
MCV: 88.8 fL (ref 80.0–100.0)
Platelets: 279 10*3/uL (ref 150–400)
RBC: 4.38 MIL/uL (ref 3.87–5.11)
RDW: 12.6 % (ref 11.5–15.5)
WBC: 7.1 10*3/uL (ref 4.0–10.5)
nRBC: 0 % (ref 0.0–0.2)

## 2023-12-31 LAB — TYPE AND SCREEN
ABO/RH(D): O POS
Antibody Screen: NEGATIVE

## 2023-12-31 LAB — SURGICAL PCR SCREEN
MRSA, PCR: NEGATIVE
Staphylococcus aureus: NEGATIVE

## 2024-01-01 ENCOUNTER — Encounter (HOSPITAL_COMMUNITY): Payer: Self-pay

## 2024-01-01 NOTE — Progress Notes (Signed)
 Case: [id number] Date/Time: 01/12/24 0941   Procedure: ARTHROPLASTY, HIP, TOTAL, ANTERIOR APPROACH (Right: Hip)   Anesthesia type: Spinal   Diagnosis: Osteoarthritis of right hip, unspecified osteoarthritis type [M16.11]   Pre-op diagnosis: RIGHT HIP OSTEOARTHRITIS   Location: [facility] ROOM 07 / [facility]   Surgeons: Gean Birchwood, MD       DISCUSSION: Lauren Paul is a 58 yo female who presents to PAT prior to surgery above. 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  Patient follows with Cardiology at [facility] for palpitaitons and hx of SVT, PVCs, atrial tachycardia.  An event recorder was performed and this demonstrated occasional episodes of short atrial tachycardia with some PVCs. This event recorder was relatively benign. Prior stress echocardiograms demonstrated no ischemia. She saw her Cardiology on 12/01/23 and was cleared:  "Her risk for hip replacement is low based on her negative stress test Her arrhythmia risk is quite low and I think that her event recorder is relatively benign. She will return to clinic in 6 months after her hip replacement to see whether or not any further evaluation of her arrhythmias is required."  Patient saw her PCP on 12/12/23 for pre op clearance. Noted to be stable and cleared for surgery:  "Patient with well-controlled hypertension, prediabetes, and hyperlipidemia. She has a history of obstructive sleep apnea, not currently being treated, she has a future appointment with the sleep specialist. She has Mallampati 4 anatomy in her throat. She has a large number of allergies listed on her chart, with EpiPen to use as needed, as well as a history of mild intermittent asthma that has not bothered her for years."  VS: BP (!) 149/96   Pulse (!) 57   Temp 36.8 C (Oral)   Resp 16   Ht 4\' 11"  (1.499 m)   Wt 69.4 kg   SpO2 100%   BMI 30.90 kg/m   PROVIDERS: Stevphen Rochester,  MD Cardiology: Dyane Dustman, MD  LABS: Labs reviewed: Acceptable for surgery. (all labs ordered are listed, but only abnormal results are displayed)  Labs Reviewed  SURGICAL PCR SCREEN  BASIC METABOLIC PANEL  CBC  TYPE AND SCREEN     IMAGES:   EKG 12/12/23 ([facility]; report only):  EKG: unchanged from previous tracings, rate 51, sinus bradycardia, early repolarization changes. .  CV:  Cardiac monitor 11/22/4:  Impression The patient was monitored for a total of 9d 22h, from October 30 until August 30, 2023.  Underlying rhythm is Sinus.  The minimum heart rate was 49 bpm; the maximum 120 bpm; the average 74 bpm.  Patient remained bradycardic 4.2 % of total time. Patient remained tachycardic 1.6 % of total time.  Total count of Ventricular Tachycardia (VT): 2 episode(s). Patient was asymptomatic. Longest and fastest run: 3 beats Fastest VT: with HR185 bpm on Day 9 / 08:46 am. Another 3 beat run of wide complex tachycardia with HR 147 bpm on Day 8 / 07:30 am.  3 supraventricular episodes were found. Patient was asymptomatic. Longest PAT 6 beat run with HR 129 bpm at 6:50 am. Fastest episode 5 beat run with HR 148 bpm at 12:45 pm. It represents PAT versus sinus tachycardia.  There were a total of 1678 PVCs with 1 couplets. Overall PVC Burden at 0.2 %. 6 PVCs noted in bigeminal pattern and 16 PVCs in trigeminal pattern.  There were a total of 292 PSVCs with 16 couplets. Overall PSVC Burden at 0.03 %.  There is a total of 0 patient events.  The longest R-R interval is 1.3 seconds.  No A. fib, no long pauses 3 seconds or more noted  Stress Echo 12/31/22:  Impression Left Ventricle: The left ventricular systolic function at rest is 60-65%. Systolic function is normal.   Left Ventricle: Wall motion is normal.   Post-stress: The left ventricle systolic function is hyperdynamic post-stress with an EF of 70-75 %.   Post-stress: The post-stress echo showed normal wall  motion which was hyperdynamic compared to baseline.   Stress ECG: ECG Conclusion:  No ischemic ST segment changes occurred with stress. No chest pain. Borderline exaggerated hypertensive BP response to exercise with max BP 203/85 mmHg. Good functional capacity with estimated workload 9.5 minutes which is above average functional capacity for age and gender. Treadmill Duke Score +5.   Post-stress Impression: The study is normal.   Post-stress Impression: This study shows a low prognostic risk.   Post-stress Impression: The ECG and Echo portions of the stress study are concordant with no evidence of inducible myocardial ischemia. Past Medical History:  Diagnosis Date   Allergy    Arthritis    Asthma    Headache    migraines   Hypersomnolence 06/19/2014   Hypertension    Meningitis    Pre-diabetes    Rectal fissure    Sepsis (HCC)    Sleep apnea    no cpap   Ulcer     Past Surgical History:  Procedure Laterality Date   ABLATION     DILATION AND CURETTAGE OF UTERUS     TOTAL HIP ARTHROPLASTY Left 09/2020   TUBAL LIGATION      MEDICATIONS:  Camphor-Menthol-Capsicum (TIGER BALM PAIN RELIEVING) 110-33-22 MG PTCH   EPINEPHrine (EPIPEN 2-PAK) 0.3 mg/0.3 mL IJ SOAJ injection   escitalopram (LEXAPRO) 20 MG tablet   hydrochlorothiazide (HYDRODIURIL) 12.5 MG tablet   metoprolol tartrate (LOPRESSOR) 25 MG tablet   Multiple Vitamins-Minerals (SOLO) TABS   spironolactone (ALDACTONE) 25 MG tablet   traZODone (DESYREL) 50 MG tablet   No current facility-administered medications for this encounter.   Marcille Blanco [facility] Surgical Short Stay/Anesthesiology [facility] Phone [phone] 01/01/2024 9:34 AM

## 2024-01-01 NOTE — Anesthesia Preprocedure Evaluation (Addendum)
 Anesthesia Evaluation  Patient identified by MRN, date of birth, ID band Patient awake    Reviewed: Allergy & Precautions, NPO status , Patient's Chart, lab work & pertinent test results, reviewed documented beta blocker date and time   History of Anesthesia Complications Negative for: history of anesthetic complications  Airway Mallampati: II  TM Distance: >3 FB     Dental  (+) Missing,    Pulmonary asthma (mild) , sleep apnea , neg pneumonia , neg COPD, neg recent URI   breath sounds clear to auscultation       Cardiovascular hypertension, Pt. on medications (-) CAD, (-) Past MI, (-) Cardiac Stents, (-) CABG and (-) DOE + dysrhythmias (runs of AT on loop recorder) (-) pacemaker(-) Cardiac Defibrillator  Rhythm:Regular Rate:Normal     Neuro/Psych  Headaches, neg Seizures    GI/Hepatic ,neg GERD  ,,(+) neg Cirrhosis        Endo/Other    Renal/GU Renal disease     Musculoskeletal  (+) Arthritis , Osteoarthritis,    Abdominal   Peds  Hematology   Anesthesia Other Findings   Reproductive/Obstetrics                              Anesthesia Physical Anesthesia Plan  ASA: 2  Anesthesia Plan: Spinal   Post-op Pain Management:    Induction: Intravenous  PONV Risk Score and Plan: 2 and Ondansetron  Airway Management Planned: Natural Airway and Simple Face Mask  Additional Equipment:   Intra-op Plan:   Post-operative Plan:   Informed Consent: I have reviewed the patients History and Physical, chart, labs and discussed the procedure including the risks, benefits and alternatives for the proposed anesthesia with the patient or authorized representative who has indicated his/her understanding and acceptance.     Dental advisory given  Plan Discussed with: CRNA  Anesthesia Plan Comments: (See PAT note from 3/12 by Sherlie Ban PA-C )        Anesthesia Quick Evaluation

## 2024-01-07 DIAGNOSIS — M1611 Unilateral primary osteoarthritis, right hip: Secondary | ICD-10-CM | POA: Diagnosis present

## 2024-01-07 NOTE — H&P (Signed)
 TOTAL HIP ADMISSION H&P  Patient is admitted for right total hip arthroplasty.  Subjective:  Chief Complaint: right hip pain  HPI: Lauren Paul, 58 y.o. female, has a history of pain and functional disability in the right hip(s) due to arthritis and patient has failed non-surgical conservative treatments for greater than 12 weeks to include NSAID's and/or analgesics, use of assistive devices, weight reduction as appropriate, and activity modification.  Onset of symptoms was abrupt starting 1 years ago with rapidlly worsening course since that time.The patient noted no past surgery on the right hip(s).  Patient currently rates pain in the right hip at 10 out of 10 with activity. Patient has night pain, worsening of pain with activity and weight bearing, trendelenberg gait, pain that interfers with activities of daily living, and pain with passive range of motion. Patient has evidence of periarticular osteophytes, joint subluxation, and joint space narrowing by imaging studies. This condition presents safety issues increasing the risk of falls. There is no current active infection.  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 07/23/2017   Multiple drug allergies 07/21/2017   Mild intermittent asthma without complication 07/21/2017   Other allergic rhinitis 07/21/2017   Spinal stenosis of lumbar region with neurogenic claudication 04/20/2017   Obesity 08/27/2016   OSA (obstructive sleep apnea) 08/12/2016   Glucose intolerance (impaired glucose tolerance) 08/06/2016   Menopausal hot flushes 08/06/2016   Essential hypertension, benign 06/19/2014   Past Medical History:  Diagnosis Date   Allergy    Arthritis    Asthma    Headache    migraines   Hypersomnolence 06/19/2014   Hypertension    Meningitis     Pre-diabetes    Rectal fissure    Sepsis (HCC)    Sleep apnea    no cpap   Ulcer     Past Surgical History:  Procedure Laterality Date   ABLATION     DILATION AND CURETTAGE OF UTERUS     TOTAL HIP ARTHROPLASTY Left 09/2020   TUBAL LIGATION      No current facility-administered medications for this encounter.   Current Outpatient Medications  Medication Sig Dispense Refill Last Dose/Taking   Camphor-Menthol-Capsicum (TIGER BALM PAIN RELIEVING) 110-33-22 MG PTCH Place 1 patch onto the skin daily as needed (pain).   Taking As Needed   EPINEPHrine (EPIPEN 2-PAK) 0.3 mg/0.3 mL IJ SOAJ injection Inject 0.3 mLs (0.3 mg total) into the muscle as needed for anaphylaxis. Use as directed for severe allergic reaction 2 each 2 Taking As Needed   escitalopram (LEXAPRO) 20 MG tablet Take 1 tablet (20 mg total) by mouth daily. 90 tablet 3 Taking   hydrochlorothiazide (HYDRODIURIL) 12.5 MG tablet Take 1 tablet (12.5 mg total) by mouth daily. 90 tablet 1 Taking   metoprolol tartrate (LOPRESSOR) 25 MG tablet Take 25 mg by mouth daily as needed (heart rate above 60).   Taking As Needed   Multiple Vitamins-Minerals (SOLO) TABS Take 1 tablet by mouth daily.   Taking   spironolactone (ALDACTONE) 25 MG tablet Take 25 mg by mouth daily.   Taking   traZODone (DESYREL) 50 MG tablet Take 1-2 tablets (50-100 mg total) by mouth at bedtime as needed. for sleep (Patient not taking: Reported on 12/30/2023) 60 tablet 3 Not Taking   Allergies  Allergen Reactions   Bee Venom Anaphylaxis  Benadryl [Diphenhydramine Hcl] Shortness Of Breath    Pt states she can take Childrens Benadryl dye free fine.   Cantaloupe Extract Allergy Skin Test Anaphylaxis   Citrullus Vulgaris Anaphylaxis   Diphenhydramine Hcl Anaphylaxis and Swelling   Diphenhydramine Hcl Anaphylaxis, Shortness Of Breath and Swelling    Pt states she can take Childrens Benadryl dye free fine. Pt states she can take Childrens Benadryl dye free fine.    Doxycycline Swelling   Latex Swelling   Peanut (Diagnostic) Anaphylaxis, Hives, Itching and Rash   Penicillins Anaphylaxis    Has patient had a PCN reaction causing immediate rash, facial/tongue/throat swelling, SOB or lightheadedness with hypotension: Yes Has patient had a PCN reaction causing severe rash involving mucus membranes or skin necrosis: No Has patient had a PCN reaction that required hospitalization: Unknown Has patient had a PCN reaction occurring within the last 10 years: No If all of the above answers are "NO", then may proceed with Cephalosporin use.    Apap-Pamabrom-Pyrilamine    Betadine [Povidone Iodine]    Iodides    Losartan Other (See Comments)    LEG CRAMPS   Olmesartan     Leg cramp    Other     Watermelon- Anaphylaxis Nuts Melon   Shellfish Allergy    Sulfa Antibiotics    Valsartan     Fatigue and alopecia    Social History   Tobacco Use   Smoking status: Never   Smokeless tobacco: Never  Substance Use Topics   Alcohol use: Yes    Alcohol/week: 2.0 standard drinks of alcohol    Types: 2 Standard drinks or equivalent per week    Comment: 2-3 glasses of wkly    Family History  Problem Relation Age of Onset   Bladder Cancer Mother 63   Uterine cancer Mother    Cancer Mother 51       Uterine and bladder cancer   Hypertension Mother    Multiple sclerosis Mother    Lung cancer Father    Cancer Father 33       lung cancer   Hypertension Sister    Allergic rhinitis Neg Hx    Angioedema Neg Hx    Asthma Neg Hx    Eczema Neg Hx    Immunodeficiency Neg Hx    Urticaria Neg Hx      Review of Systems  Constitutional: Negative.   HENT: Negative.    Eyes: Negative.   Respiratory: Negative.    Cardiovascular:        HTN  Gastrointestinal:  Positive for nausea.  Endocrine: Negative.   Genitourinary: Negative.   Musculoskeletal:  Positive for arthralgias and myalgias.  Allergic/Immunologic: Negative.   Neurological:  Positive for weakness.   Hematological:  Bruises/bleeds easily.  Psychiatric/Behavioral:  Positive for sleep disturbance.     Objective:  Physical Exam Constitutional:      Appearance: Normal appearance. She is obese.  HENT:     Head: Normocephalic and atraumatic.     Nose: Nose normal.     Mouth/Throat:     Mouth: Mucous membranes are moist.     Pharynx: Oropharynx is clear.  Eyes:     Pupils: Pupils are equal, round, and reactive to light.  Cardiovascular:     Pulses: Normal pulses.  Pulmonary:     Effort: Pulmonary effort is normal.  Musculoskeletal:        General: Tenderness present.     Cervical back: Normal range of motion.  Comments: she has obvious pain with attempts of internal rotation of the right hip.  She walks with a slight right-sided limp.  Tenderness with palpation over the lateral aspect of the right hip.  Skin:    General: Skin is warm and dry.  Neurological:     General: No focal deficit present.     Mental Status: She is alert and oriented to person, place, and time. Mental status is at baseline.  Psychiatric:        Mood and Affect: Mood normal.        Behavior: Behavior normal.        Thought Content: Thought content normal.        Judgment: Judgment normal.     Vital signs in last 24 hours:    Labs:   Estimated body mass index is 30.9 kg/m as calculated from the following:   Height as of 12/31/23: 4\' 11"  (1.499 m).   Weight as of 12/31/23: 69.4 kg.   Imaging Review Plain radiographs demonstrate  AP of the pelvis and crosstable lateral of the right hip are taken and reviewed in office today.  This does show an avulsion fracture of the superior lateral acetabular osteophyte with lateral subluxation of the femoral head.   Assessment/Plan:  End stage arthritis, right hip(s)  The patient history, physical examination, clinical judgement of the provider and imaging studies are consistent with end stage degenerative joint disease of the right hip(s) and total hip  arthroplasty is deemed medically necessary. The treatment options including medical management, injection therapy, arthroscopy and arthroplasty were discussed at length. The risks and benefits of total hip arthroplasty were presented and reviewed. The risks due to aseptic loosening, infection, stiffness, dislocation/subluxation,  thromboembolic complications and other imponderables were discussed.  The patient acknowledged the explanation, agreed to proceed with the plan and consent was signed. Patient is being admitted for inpatient treatment for surgery, pain control, PT, OT, prophylactic antibiotics, VTE prophylaxis, progressive ambulation and ADL's and discharge planning.The patient is planning to be discharged home with home health services    Patient's anticipated LOS is less than 2 midnights, meeting these requirements: - Younger than 39 - Lives within 1 hour of care - Has a competent adult at home to recover with post-op recover - NO history of  - Chronic pain requiring opiods  - Diabetes  - Coronary Artery Disease  - Heart failure  - Heart attack  - Stroke  - DVT/VTE  - Cardiac arrhythmia  - Respiratory Failure/COPD  - Renal failure  - Anemia  - Advanced Liver disease

## 2024-01-11 MED ORDER — TRANEXAMIC ACID 1000 MG/10ML IV SOLN
2000.0000 mg | INTRAVENOUS | Status: DC
  Filled 2024-01-11: qty 20

## 2024-01-12 ENCOUNTER — Encounter (HOSPITAL_COMMUNITY)
Admission: RE | Disposition: A | Discharge status: Home / Self Care | Payer: Self-pay | Source: Ambulatory Visit | Attending: Orthopedic Surgery

## 2024-01-12 ENCOUNTER — Ambulatory Visit (HOSPITAL_COMMUNITY): Admitting: Anesthesiology

## 2024-01-12 ENCOUNTER — Ambulatory Visit (HOSPITAL_COMMUNITY)

## 2024-01-12 ENCOUNTER — Ambulatory Visit (HOSPITAL_COMMUNITY)
Admission: RE | Admit: 2024-01-12 | Discharge: 2024-01-12 | Disposition: A | Discharge status: Home / Self Care | Source: Ambulatory Visit | Attending: Orthopedic Surgery | Admitting: Orthopedic Surgery

## 2024-01-12 ENCOUNTER — Ambulatory Visit (HOSPITAL_COMMUNITY): Payer: Self-pay | Admitting: Medical

## 2024-01-12 ENCOUNTER — Other Ambulatory Visit: Payer: Self-pay

## 2024-01-12 ENCOUNTER — Encounter (HOSPITAL_COMMUNITY): Payer: Self-pay | Admitting: Orthopedic Surgery

## 2024-01-12 DIAGNOSIS — I10 Essential (primary) hypertension: Secondary | ICD-10-CM | POA: Insufficient documentation

## 2024-01-12 DIAGNOSIS — M1611 Unilateral primary osteoarthritis, right hip: Secondary | ICD-10-CM | POA: Diagnosis present

## 2024-01-12 DIAGNOSIS — Z01818 Encounter for other preprocedural examination: Secondary | ICD-10-CM

## 2024-01-12 HISTORY — PX: TOTAL HIP ARTHROPLASTY: SHX124

## 2024-01-12 LAB — ABO/RH: ABO/RH(D): O POS

## 2024-01-12 SURGERY — ARTHROPLASTY, HIP, TOTAL, ANTERIOR APPROACH
Anesthesia: Spinal | Site: Hip | Laterality: Right

## 2024-01-12 MED ORDER — FENTANYL CITRATE (PF) 100 MCG/2ML IJ SOLN
INTRAMUSCULAR | Status: DC | PRN
  Administered 2024-01-12 (×4): 25 ug via INTRAVENOUS

## 2024-01-12 MED ORDER — OXYCODONE HCL 5 MG PO TABS
ORAL_TABLET | ORAL | Status: AC
  Filled 2024-01-12: qty 1

## 2024-01-12 MED ORDER — MIDAZOLAM HCL 5 MG/5ML IJ SOLN
INTRAMUSCULAR | Status: DC | PRN
  Administered 2024-01-12 (×2): 1 mg via INTRAVENOUS

## 2024-01-12 MED ORDER — FENTANYL CITRATE PF 50 MCG/ML IJ SOSY
25.0000 ug | PREFILLED_SYRINGE | INTRAMUSCULAR | Status: DC | PRN
  Administered 2024-01-12: 50 ug via INTRAVENOUS

## 2024-01-12 MED ORDER — FENTANYL CITRATE PF 50 MCG/ML IJ SOSY
PREFILLED_SYRINGE | INTRAMUSCULAR | Status: AC
  Filled 2024-01-12: qty 1

## 2024-01-12 MED ORDER — TIZANIDINE HCL 2 MG PO TABS: 2.0000 mg | ORAL_TABLET | Freq: Four times a day (QID) | ORAL | 0 refills | Status: AC | PRN

## 2024-01-12 MED ORDER — PHENYLEPHRINE HCL (PRESSORS) 10 MG/ML IV SOLN
INTRAVENOUS | Status: AC
  Filled 2024-01-12: qty 1

## 2024-01-12 MED ORDER — METHOCARBAMOL 1000 MG/10ML IJ SOLN: 500.0000 mg | Freq: Four times a day (QID) | INTRAMUSCULAR | Status: DC | PRN

## 2024-01-12 MED ORDER — DEXAMETHASONE SODIUM PHOSPHATE 10 MG/ML IJ SOLN
INTRAMUSCULAR | Status: AC
  Filled 2024-01-12: qty 1

## 2024-01-12 MED ORDER — FENTANYL CITRATE (PF) 100 MCG/2ML IJ SOLN
INTRAMUSCULAR | Status: AC
Start: 2024-01-12 — End: ?
  Filled 2024-01-12: qty 2

## 2024-01-12 MED ORDER — BUPIVACAINE IN DEXTROSE 0.75-8.25 % IT SOLN
INTRATHECAL | Status: DC | PRN
  Administered 2024-01-12: 1.6 mL via INTRATHECAL

## 2024-01-12 MED ORDER — GLYCOPYRROLATE 0.2 MG/ML IJ SOLN
INTRAMUSCULAR | Status: AC
  Filled 2024-01-12: qty 1

## 2024-01-12 MED ORDER — METHOCARBAMOL 500 MG PO TABS
ORAL_TABLET | ORAL | Status: AC
  Filled 2024-01-12: qty 1

## 2024-01-12 MED ORDER — OXYCODONE HCL 5 MG PO TABS
10.0000 mg | ORAL_TABLET | ORAL | Status: DC | PRN
Start: 2024-01-12 — End: 2024-01-12
  Administered 2024-01-12: 5 mg via ORAL

## 2024-01-12 MED ORDER — PROPOFOL 500 MG/50ML IV EMUL
INTRAVENOUS | Status: DC | PRN
  Administered 2024-01-12: 75 ug/kg/min via INTRAVENOUS

## 2024-01-12 MED ORDER — ONDANSETRON HCL 4 MG/2ML IJ SOLN: 4.0000 mg | Freq: Once | INTRAMUSCULAR | Status: DC | PRN

## 2024-01-12 MED ORDER — ASPIRIN 81 MG PO TBEC: 81.0000 mg | DELAYED_RELEASE_TABLET | Freq: Two times a day (BID) | ORAL | 0 refills | Status: AC

## 2024-01-12 MED ORDER — LACTATED RINGERS IV BOLUS
250.0000 mL | Freq: Once | INTRAVENOUS | Status: AC
  Administered 2024-01-12: 250 mL via INTRAVENOUS

## 2024-01-12 MED ORDER — PHENYLEPHRINE 80 MCG/ML (10ML) SYRINGE FOR IV PUSH (FOR BLOOD PRESSURE SUPPORT)
PREFILLED_SYRINGE | INTRAVENOUS | Status: AC
  Filled 2024-01-12: qty 10

## 2024-01-12 MED ORDER — PROPOFOL 1000 MG/100ML IV EMUL
INTRAVENOUS | Status: AC
  Filled 2024-01-12: qty 100

## 2024-01-12 MED ORDER — 0.9 % SODIUM CHLORIDE (POUR BTL) OPTIME
TOPICAL | Status: DC | PRN
  Administered 2024-01-12: 1000 mL

## 2024-01-12 MED ORDER — BUPIVACAINE-EPINEPHRINE (PF) 0.25% -1:200000 IJ SOLN
INTRAMUSCULAR | Status: AC
  Filled 2024-01-12: qty 30

## 2024-01-12 MED ORDER — VANCOMYCIN HCL IN DEXTROSE 1-5 GM/200ML-% IV SOLN
1000.0000 mg | Freq: Once | INTRAVENOUS | Status: AC
  Administered 2024-01-12: 1000 mg via INTRAVENOUS
  Filled 2024-01-12: qty 200

## 2024-01-12 MED ORDER — OXYCODONE HCL 5 MG PO TABS
5.0000 mg | ORAL_TABLET | ORAL | Status: DC | PRN
Start: 2024-01-12 — End: 2024-01-12
  Administered 2024-01-12: 10 mg via ORAL

## 2024-01-12 MED ORDER — LACTATED RINGERS IV BOLUS
500.0000 mL | Freq: Once | INTRAVENOUS | Status: AC
  Administered 2024-01-12: 500 mL via INTRAVENOUS

## 2024-01-12 MED ORDER — TRANEXAMIC ACID-NACL 1000-0.7 MG/100ML-% IV SOLN: 1000.0000 mg | Freq: Once | INTRAVENOUS | Status: DC

## 2024-01-12 MED ORDER — ORAL CARE MOUTH RINSE: 15.0000 mL | Freq: Once | OROMUCOSAL | Status: AC

## 2024-01-12 MED ORDER — METHOCARBAMOL 500 MG PO TABS
500.0000 mg | ORAL_TABLET | Freq: Four times a day (QID) | ORAL | Status: DC | PRN
  Administered 2024-01-12: 500 mg via ORAL

## 2024-01-12 MED ORDER — EPHEDRINE SULFATE-NACL 50-0.9 MG/10ML-% IV SOSY
PREFILLED_SYRINGE | INTRAVENOUS | Status: DC | PRN
Start: 2024-01-12 — End: 2024-01-12
  Administered 2024-01-12: 5 mg via INTRAVENOUS

## 2024-01-12 MED ORDER — TRANEXAMIC ACID-NACL 1000-0.7 MG/100ML-% IV SOLN
1000.0000 mg | INTRAVENOUS | Status: AC
  Administered 2024-01-12: 1000 mg via INTRAVENOUS
  Filled 2024-01-12: qty 100

## 2024-01-12 MED ORDER — CARMEX CLASSIC LIP BALM EX OINT
TOPICAL_OINTMENT | CUTANEOUS | Status: AC
  Filled 2024-01-12: qty 10

## 2024-01-12 MED ORDER — PHENYLEPHRINE HCL-NACL 20-0.9 MG/250ML-% IV SOLN
INTRAVENOUS | Status: DC | PRN
  Administered 2024-01-12: 20 ug/min via INTRAVENOUS

## 2024-01-12 MED ORDER — LACTATED RINGERS IV SOLN
INTRAVENOUS | Status: DC
Start: 2024-01-12 — End: 2024-01-12

## 2024-01-12 MED ORDER — SODIUM CHLORIDE (PF) 0.9 % IJ SOLN
INTRAMUSCULAR | Status: AC
  Filled 2024-01-12: qty 20

## 2024-01-12 MED ORDER — DEXAMETHASONE SODIUM PHOSPHATE 10 MG/ML IJ SOLN
INTRAMUSCULAR | Status: DC | PRN
  Administered 2024-01-12: 10 mg via INTRAVENOUS

## 2024-01-12 MED ORDER — DEXMEDETOMIDINE HCL IN NACL 80 MCG/20ML IV SOLN
INTRAVENOUS | Status: DC | PRN
Start: 2024-01-12 — End: 2024-01-12
  Administered 2024-01-12 (×2): 4 ug via INTRAVENOUS

## 2024-01-12 MED ORDER — SODIUM CHLORIDE (PF) 0.9 % IJ SOLN
INTRAMUSCULAR | Status: DC | PRN
  Administered 2024-01-12: 100 mL

## 2024-01-12 MED ORDER — ONDANSETRON HCL 4 MG/2ML IJ SOLN
INTRAMUSCULAR | Status: AC
  Filled 2024-01-12: qty 2

## 2024-01-12 MED ORDER — LIDOCAINE HCL (PF) 2 % IJ SOLN
INTRAMUSCULAR | Status: DC | PRN
  Administered 2024-01-12: 50 mg via INTRADERMAL

## 2024-01-12 MED ORDER — LACTATED RINGERS IV SOLN: INTRAVENOUS | Status: DC

## 2024-01-12 MED ORDER — SODIUM CHLORIDE (PF) 0.9 % IJ SOLN
INTRAMUSCULAR | Status: AC
  Filled 2024-01-12: qty 50

## 2024-01-12 MED ORDER — DEXMEDETOMIDINE HCL IN NACL 80 MCG/20ML IV SOLN
INTRAVENOUS | Status: AC
  Filled 2024-01-12: qty 20

## 2024-01-12 MED ORDER — TRANEXAMIC ACID 1000 MG/10ML IV SOLN
INTRAVENOUS | Status: DC | PRN
  Administered 2024-01-12: 2000 mg via TOPICAL

## 2024-01-12 MED ORDER — ONDANSETRON HCL 4 MG/2ML IJ SOLN
INTRAMUSCULAR | Status: DC | PRN
  Administered 2024-01-12: 4 mg via INTRAVENOUS

## 2024-01-12 MED ORDER — BUPIVACAINE LIPOSOME 1.3 % IJ SUSP
INTRAMUSCULAR | Status: AC
  Filled 2024-01-12: qty 20

## 2024-01-12 MED ORDER — GLYCOPYRROLATE 0.2 MG/ML IJ SOLN
INTRAMUSCULAR | Status: DC | PRN
Start: 2024-01-12 — End: 2024-01-12
  Administered 2024-01-12 (×2): .1 mg via INTRAVENOUS

## 2024-01-12 MED ORDER — MIDAZOLAM HCL 2 MG/2ML IJ SOLN
INTRAMUSCULAR | Status: AC
  Filled 2024-01-12: qty 2

## 2024-01-12 MED ORDER — BUPIVACAINE LIPOSOME 1.3 % IJ SUSP: 10.0000 mL | Freq: Once | INTRAMUSCULAR | Status: DC

## 2024-01-12 MED ORDER — OXYCODONE HCL 5 MG PO TABS
ORAL_TABLET | ORAL | Status: AC
  Filled 2024-01-12: qty 2

## 2024-01-12 MED ORDER — CHLORHEXIDINE GLUCONATE 0.12 % MT SOLN
15.0000 mL | Freq: Once | OROMUCOSAL | Status: AC
  Administered 2024-01-12: 15 mL via OROMUCOSAL

## 2024-01-12 MED ORDER — OXYCODONE-ACETAMINOPHEN 5-325 MG PO TABS: 1.0000 | ORAL_TABLET | ORAL | 0 refills | Status: AC | PRN

## 2024-01-12 SURGICAL SUPPLY — 42 items
BAG COUNTER SPONGE SURGICOUNT (BAG) ×1 IMPLANT
BAG DECANTER FOR FLEXI CONT (MISCELLANEOUS) ×2 IMPLANT
BALL HIP CERAMIC (Hips) IMPLANT
BLADE SAW SGTL 18X1.27X75 (BLADE) ×1 IMPLANT
CHLORAPREP W/TINT 26 (MISCELLANEOUS) IMPLANT
COVER PERINEAL POST (MISCELLANEOUS) ×1 IMPLANT
COVER SURGICAL LIGHT HANDLE (MISCELLANEOUS) ×1 IMPLANT
CUP ACETBLR 48 OD 100 SERIES (Hips) IMPLANT
DRAPE STERI IOBAN 125X83 (DRAPES) ×1 IMPLANT
DRAPE U-SHAPE 47X51 STRL (DRAPES) ×2 IMPLANT
DRSG AQUACEL AG ADV 3.5X10 (GAUZE/BANDAGES/DRESSINGS) ×1 IMPLANT
DURAPREP 26ML APPLICATOR (WOUND CARE) ×1 IMPLANT
ELECT BLADE TIP CTD 4 INCH (ELECTRODE) ×1 IMPLANT
ELECT REM PT RETURN 15FT ADLT (MISCELLANEOUS) ×1 IMPLANT
ELIMINATOR HOLE APEX DEPUY (Hips) IMPLANT
GLOVE BIO SURGEON STRL SZ7.5 (GLOVE) ×1 IMPLANT
GLOVE BIO SURGEON STRL SZ8.5 (GLOVE) ×1 IMPLANT
GLOVE BIOGEL PI IND STRL 8 (GLOVE) ×1 IMPLANT
GLOVE BIOGEL PI IND STRL 9 (GLOVE) ×1 IMPLANT
GLOVE SURG SS PI 7.5 STRL IVOR (GLOVE) IMPLANT
GLOVE SURG SS PI 8.5 STRL STRW (GLOVE) IMPLANT
GOWN STRL REUS W/ TWL XL LVL3 (GOWN DISPOSABLE) ×2 IMPLANT
HIBICLENS CHG 4% 4OZ BTL (MISCELLANEOUS) IMPLANT
HIP BALL CERAMIC (Hips) ×1 IMPLANT
HOLDER FOLEY CATH W/STRAP (MISCELLANEOUS) ×1 IMPLANT
KIT TURNOVER KIT A (KITS) IMPLANT
MANIFOLD NEPTUNE II (INSTRUMENTS) ×1 IMPLANT
NDL HYPO 21X1.5 SAFETY (NEEDLE) ×2 IMPLANT
NEEDLE HYPO 21X1.5 SAFETY (NEEDLE) ×2 IMPLANT
NS IRRIG 1000ML POUR BTL (IV SOLUTION) ×1 IMPLANT
PACK ANTERIOR HIP CUSTOM (KITS) ×1 IMPLANT
PINN ALTRX NEUT ID X OD 32X48 IMPLANT
SPIKE FLUID TRANSFER (MISCELLANEOUS) ×1 IMPLANT
STEM FEM ACTIS STD SZ4 (Stem) IMPLANT
SUT VIC AB 0 CT1 27XBRD ANBCTR (SUTURE) ×1 IMPLANT
SUT VIC AB 1 CTX36XBRD ANBCTR (SUTURE) ×1 IMPLANT
SUT VIC AB 2-0 CT1 TAPERPNT 27 (SUTURE) ×1 IMPLANT
SUT VICRYL+ 3-0 36IN CT-1 (SUTURE) ×1 IMPLANT
SYR CONTROL 10ML LL (SYRINGE) ×3 IMPLANT
TRAY CATH INTERMITTENT SS 16FR (CATHETERS) IMPLANT
TRAY FOLEY MTR SLVR 16FR STAT (SET/KITS/TRAYS/PACK) IMPLANT
TUBE SUCTION HIGH CAP CLEAR NV (SUCTIONS) ×1 IMPLANT

## 2024-01-12 NOTE — Op Note (Signed)
 PATIENT ID:      Lauren Paul  MRN:     [id number] DOB/AGE:    12-25-1965 / 58 y.o.  OPERATIVE REPORT   DATE OF PROCEDURE:  01/12/2024      PREOPERATIVE DIAGNOSIS:  RIGHT HIP OSTEOARTHRITIS                                                         POSTOPERATIVE DIAGNOSIS:  Same                                                         PROCEDURE: Anterior R total hip arthroplasty using a 52 mm DePuy Pinnacle  Cup, [employer], 0-degree polyethylene liner, a +5 mm x 36mm ceramic head, a 4 std Depuy Actis stem  SURGEON: Nestor Lewandowsky  ASSISTANT:   Tomi Likens. [employer]  (present throughout entire procedure and necessary for timely completion of the procedure)   ANESTHESIA: Spinal, Exparel 133mg  injection BLOOD LOSS: 300 cc FLUID REPLACEMENT: 1500 cc crystalloid TRANEXAMIC ACID: 1gm IV, 2gm Topical COMPLICATIONS: none    INDICATIONS FOR PROCEDURE: A 58 y.o. year-old With  RIGHT HIP OSTEOARTHRITIS   for 3 years, x-rays show bone-on-bone arthritic changes, and osteophytes. Despite conservative measures with observation, anti-inflammatory medicine, narcotics, use of a cane, has severe unremitting pain and can ambulate only a few blocks before resting. Patient desires elective R total hip arthroplasty to decrease pain and increase function. The risks, benefits, and alternatives were discussed at length including but not limited to the risks of infection, bleeding, nerve injury, stiffness, blood clots, the need for revision surgery, cardiopulmonary complications, among others, and they were willing to proceed. Questions answered      PROCEDURE IN DETAIL: The patient was identified by armband, received preoperative IV antibiotics, in the holding area at [facility], taken to the operating room , appropriate anesthetic monitors were attached and anesthesia was induced with the patient on the gurney. HANA boots were applied to the feet, and the patient  was transferred to the HANA table  with a peroneal post and support underneath the non-operative leg. The operative lower extremity was then prepped and draped in the usual sterile fashion from just above the iliac crest to the knee. And a timeout procedure was performed. Tomi Likens. Vear Clock [facility] was present and scrubbed throughout the case, critical for assistance with, positioning, exposure, retraction, instrumentation, and closure.Skin along incision area was injected with 10 cc of Exparel solution. We then made a 11 cm incision along the interval at the leading edge of the tensor fascia lata of starting at 2 cm lateral to the ASIS. Small bleeders in the skin and subcutaneous tissue identified and cauterized we dissected down to the fascia and made an incision in the fascia allowing [place] to elevate the fascia of the tensor muscle and exploited the interval between the rectus and the tensor fascia lata. A Cobra retractor was then placed along the superior neck of the femur. A cerebellar retractor was used to expose the interval between the tensor fascia lata and the rectus femoris.  We identified and cauterized the ascending  branch of the anterior circumflex artery. A second Cobra retractor along the inferior neck of the femur. A small Hohmann retractor was placed underneath the origin of the rectus femoris, giving [place] good medial exposure. Using Ronguers fatty tissue was removed from in front of the anterior capsule. The capsule was then incised, starting out at the superior anterior rim of the acetabulum going laterally along the anterior neck. The capsule was then teed along the neck superiorly and inferiorly. Electrocautery was used to release capsule from the anterior and medial neck of the femur to allow external rotation. The cobra retractors were then placed along the inferior and superior neck. The hip was externally rotated to 40 degrees, traction applied and locked. We perform a standard neck cut with the oscillating saw and removed the femoral  head with a power corkscrew. We then placed a medium curved medium homan retractor in the cotyloid notch and standard cobra retractor posteriorly along the acetabular rim. Exposed labral tissue and osteophytes were then removed. We then sequentially reamed up to a 47 mm basket reamer obtaining good coverage in all quadrants, verified by C-arm imaging. Under C-arm control we then hammered into place a 48 mm Pinnacle cup in 45 of abduction and 15 of anteversion. The cup seated nicely and required no supplemental screws. We then placed a central hole Eliminator and a +4 mm, 0 polyethylene liner. The foot was then externally rotated to 130-140. The limb was extended and adducted to the floor, delivering the proximal femur up into the wound. A medium curved Hohmann retractor was placed over the greater trochanter and a long Homan retractor along the posterior femoral neck completing the exposure and lateralizing the femur. We then performed releases superiorly and and inferiorly of the capsule going back to the pirformis fossa superiorly and to the lesser trochanter inferiorly. We then entered the proximal femur with the box cutting offset chisel followed by, a canal sounder, the chili pepper and broaching up to a 4 broach. This seated nicely and we reamed the calcar. A trial reduction was performed with a 5 mm X 36 mm head.The limb lengths were checked by c-arm, and the hip was stable in 90 of external rotation. At this point the trial components removed and we hammered into place a std  Offset # 4Actis stem with coating. A + 5 mm x 36 head was then hammered into place. The hip was reduced and final C-arm images obtained. The wound was thoroughly irrigated with normal saline solution. We repaired the ant capsule and the tensor fascia lot a with running 0 vicryl suture. the subcutaneous and subcuticular layers were closed with running 3-0 Vicryl suture followed by an Aquacil dressing. At this point the patient was  awaken and transferred to hospital gurney without difficulty.   Nestor Lewandowsky 01/12/2024, 8:53 AM

## 2024-01-12 NOTE — Discharge Instructions (Signed)

## 2024-01-12 NOTE — Interval H&P Note (Signed)
 History and Physical Interval Note:  01/12/2024 8:53 AM  Lauren Paul  has presented today for surgery, with the diagnosis of RIGHT HIP OSTEOARTHRITIS.  The various methods of treatment have been discussed with the patient and family. After consideration of risks, benefits and other options for treatment, the patient has consented to  Procedure(s): ARTHROPLASTY, HIP, TOTAL, ANTERIOR APPROACH (Right) as a surgical intervention.  The patient's history has been reviewed, patient examined, no change in status, stable for surgery.  I have reviewed the patient's chart and labs.  Questions were answered to the patient's satisfaction.     Nestor Lewandowsky

## 2024-01-12 NOTE — Transfer of Care (Signed)
 Immediate Anesthesia Transfer of Care Note  Patient: Lauren Paul  Procedure(s) Performed: ARTHROPLASTY, HIP, TOTAL, ANTERIOR APPROACH (Right: Hip)  Patient Location: PACU  Anesthesia Type:MAC and Spinal  Level of Consciousness: awake, sedated, and drowsy  Airway & Oxygen Therapy: Patient Spontanous Breathing and Patient connected to face mask oxygen  Post-op Assessment: Report given to RN and Post -op Vital signs reviewed and stable  Post vital signs: Reviewed and stable  Last Vitals:  Vitals Value Taken Time  BP 107/72 01/12/24 1215  Temp 36.4 C 01/12/24 1215  Pulse 68 01/12/24 1222  Resp 16 01/12/24 1222  SpO2 100 % 01/12/24 1222  Vitals shown include unfiled device data.  Last Pain:  Vitals:   01/12/24 1215  TempSrc:   PainSc: 0-No pain      Patients Stated Pain Goal: 0 (01/12/24 0749)  Complications: No notable events documented.

## 2024-01-12 NOTE — Anesthesia Procedure Notes (Signed)
 Spinal  Patient location during procedure: OR Start time: 01/12/2024 10:15 AM End time: 01/12/2024 10:20 AM Reason for block: surgical anesthesia Staffing Anesthesiologist: Mariann Barter, MD Performed by: Mariann Barter, MD Authorized by: Mariann Barter, MD   Preanesthetic Checklist Completed: patient identified, IV checked, site marked, risks and benefits discussed, surgical consent, monitors and equipment checked, pre-op evaluation and timeout performed Spinal Block Patient position: sitting Prep: DuraPrep Patient monitoring: heart rate, cardiac monitor, continuous pulse ox and blood pressure Approach: midline Location: L3-4 Injection technique: single-shot Needle Needle type: Sprotte  Needle gauge: 24 G Needle length: 9 cm Assessment Sensory level: T4 Events: CSF return

## 2024-01-12 NOTE — Evaluation (Signed)
 Physical Therapy Evaluation Patient Details Name: Lauren Paul MRN: [id number] DOB: [birth year] Today's Date: 01/12/2024  History of Present Illness  Pt is 58 yo female admitted on 01/12/24 for R THA anterior approach. Pt with hx including but not limited to arthritis, spinal stenosis, LTHA, HTN  Clinical Impression  Pt is s/p THA resulting in the deficits listed below (see PT Problem List). Pt has home support and DME.  She was able to ambulate 66' and performed stairs similar to home set up.  Pt with good understanding of HEP and anterior hip precautions.  Pt has outpt PT scheduled later this week. Pt demonstrates safe gait & transfers in order to return home from PT perspective once discharged by MD.  While in hospital, will continue to benefit from PT for skilled therapy to advance mobility and exercises.           If plan is discharge home, recommend the following: A little help with walking and/or transfers;A little help with bathing/dressing/bathroom;Assistance with cooking/housework;Help with stairs or ramp for entrance   Can travel by private vehicle        Equipment Recommendations None recommended by PT  Recommendations for Other Services       Functional Status Assessment Patient has had a recent decline in their functional status and demonstrates the ability to make significant improvements in function in a reasonable and predictable amount of time.     Precautions / Restrictions Precautions Precautions: Fall;Anterior Hip Precaution Booklet Issued: Yes (comment) Restrictions Weight Bearing Restrictions Per Provider Order: Yes RLE Weight Bearing Per Provider Order: Weight bearing as tolerated      Mobility  Bed Mobility Overal bed mobility: Needs Assistance Bed Mobility: Supine to Sit     Supine to sit: Min assist          Transfers Overall transfer level: Needs assistance Equipment used: Rolling walker (2 wheels) Transfers: Sit to/from Stand Sit to  Stand: Contact guard assist                Ambulation/Gait Ambulation/Gait assistance: Contact guard assist Gait Distance (Feet): 80 Feet Assistive device: Rolling walker (2 wheels) Gait Pattern/deviations: Step-to pattern, Decreased stride length, Decreased weight shift to right Gait velocity: decreased but functional     General Gait Details: Cues for sequencing and pushing RW  Stairs Stairs: Yes Stairs assistance: Contact guard assist Stair Management: Step to pattern, With walker Number of Stairs: 1    Wheelchair Mobility     Tilt Bed    Modified Rankin (Stroke Patients Only)       Balance Overall balance assessment: Needs assistance Sitting-balance support: No upper extremity supported Sitting balance-Leahy Scale: Good     Standing balance support: Bilateral upper extremity supported, No upper extremity supported Standing balance-Leahy Scale: Fair Standing balance comment: Could static stand without AD; RW to ambulate                             Pertinent Vitals/Pain Pain Assessment Pain Assessment: 0-10 Pain Score: 10-Worst pain ever Pain Location: R hip Pain Descriptors / Indicators: Discomfort, Sore Pain Intervention(s): Limited activity within patient's tolerance, Monitored during session, Premedicated before session, Repositioned (pain improved with activity)    Home Living Family/patient expects to be discharged to:: Private residence Living Arrangements: Alone Available Help at Discharge: Family;Available 24 hours/day Type of Home: House Home Access: Stairs to enter   [employer] of Steps: 1 threshold   Home Layout:  One level Home Equipment: Shower seat;Toilet riser;Rollator (4 wheels);Rolling Walker (2 wheels)      Prior Function Prior Level of Function : Independent/Modified Independent;Working/employed;Driving                     Extremity/Trunk Assessment   Upper Extremity Assessment Upper  Extremity Assessment: Overall WFL for tasks assessed    Lower Extremity Assessment Lower Extremity Assessment: LLE deficits/detail;RLE deficits/detail RLE Deficits / Details: Expected post op changes; ROM within precautions; MMT 3/5 throughout not further tested LLE Deficits / Details: ROM WFL; MMT 5/5    Cervical / Trunk Assessment Cervical / Trunk Assessment: Normal  Communication        Cognition Arousal: Alert Behavior During Therapy: WFL for tasks assessed/performed   PT - Cognitive impairments: No apparent impairments                                 Cueing       General Comments General comments (skin integrity, edema, etc.): Pt educated to use RW not rollator  Educated on safe ice use, no pivots, car transfers, and TED hose during day. Also, encouraged walking every 1-2 hours during day. Educated on HEP with focus on mobility the first weeks. Discussed doing exercises within pain control and if pain increasing could decreased ROM, reps, and stop exercises as needed. Encouraged to perform  ankle pumps frequently for blood flow.  Educated on anterior precautions and provided handout.    Exercises Total Joint Exercises Ankle Circles/Pumps: AROM, Both, 10 reps, Supine Quad Sets: AROM, Both, 5 reps, Supine Heel Slides: AAROM, Right, 5 reps, Supine Long Arc Quad: AROM, Right, 5 reps, Seated   Assessment/Plan    PT Assessment Patient needs continued PT services  PT Problem List Decreased strength;Pain;Decreased range of motion;Decreased activity tolerance;Decreased balance;Decreased mobility;Decreased knowledge of precautions;Decreased knowledge of use of DME       PT Treatment Interventions DME instruction;Therapeutic exercise;Gait training;Balance training;Stair training;Functional mobility training;Patient/family education;Modalities    PT Goals (Current goals can be found in the Care Plan section)  Acute Rehab PT Goals Patient Stated Goal: return  home PT Goal Formulation: With patient Time For Goal Achievement: 01/26/24 Potential to Achieve Goals: Good    Frequency 7X/week     Co-evaluation               AM-PAC PT "6 Clicks" Mobility  Outcome Measure Help needed turning from your back to your side while in a flat bed without using bedrails?: A Little Help needed moving from lying on your back to sitting on the side of a flat bed without using bedrails?: A Little Help needed moving to and from a bed to a chair (including a wheelchair)?: A Little Help needed standing up from a chair using your arms (e.g., wheelchair or bedside chair)?: A Little Help needed to walk in hospital room?: A Little Help needed climbing 3-5 steps with a railing? : A Little 6 Click Score: 18    End of Session Equipment Utilized During Treatment: Gait belt Activity Tolerance: Patient tolerated treatment well Patient left: in chair;with call bell/phone within reach;with family/visitor present (in PACU) Nurse Communication: Mobility status PT Visit Diagnosis: Other abnormalities of gait and mobility (R26.89);Muscle weakness (generalized) (M62.81)    Time: [phone] (out of room for 5 mins) PT Time Calculation (min) (ACUTE ONLY): 40 min   Charges:   PT Evaluation $PT Eval Low Complexity: 1  Low PT Treatments $Gait Training: 8-22 mins PT General Charges $$ ACUTE PT VISIT: 1 Visit         Anise Salvo, PT Acute Rehab [facility] [phone]   Rayetta Humphrey 01/12/2024, 4:52 PM

## 2024-01-13 ENCOUNTER — Encounter (HOSPITAL_COMMUNITY): Payer: Self-pay | Admitting: Orthopedic Surgery

## 2024-01-13 NOTE — Anesthesia Postprocedure Evaluation (Signed)
 Anesthesia Post Note  Patient: Lottie Rater  Procedure(s) Performed: ARTHROPLASTY, HIP, TOTAL, ANTERIOR APPROACH (Right: Hip)     Patient location during evaluation: PACU Anesthesia Type: Spinal Level of consciousness: awake and alert Pain management: pain level controlled Vital Signs Assessment: post-procedure vital signs reviewed and stable Respiratory status: spontaneous breathing, nonlabored ventilation, respiratory function stable and patient connected to nasal cannula oxygen Cardiovascular status: blood pressure returned to baseline and stable Postop Assessment: no apparent nausea or vomiting Anesthetic complications: no   No notable events documented.  Last Vitals:  Vitals:   01/12/24 1520 01/12/24 1643  BP: 120/69 102/65  Pulse: 61 65  Resp:    Temp: 36.4 C 36.6 C  SpO2: 100% 98%    Last Pain:  Vitals:   01/12/24 1643  TempSrc: Oral  PainSc:    Pain Goal: Patients Stated Pain Goal: 0 (01/12/24 0749)                 Mariann Barter

## 2024-05-31 ENCOUNTER — Other Ambulatory Visit: Payer: Self-pay

## 2024-05-31 ENCOUNTER — Emergency Department (HOSPITAL_BASED_OUTPATIENT_CLINIC_OR_DEPARTMENT_OTHER)
Admission: EM | Admit: 2024-05-31 | Discharge: 2024-05-31 | Disposition: A | Discharge status: Home / Self Care | Attending: Emergency Medicine | Admitting: Emergency Medicine

## 2024-05-31 ENCOUNTER — Encounter (HOSPITAL_BASED_OUTPATIENT_CLINIC_OR_DEPARTMENT_OTHER): Payer: Self-pay | Admitting: Emergency Medicine

## 2024-05-31 DIAGNOSIS — Z7982 Long term (current) use of aspirin: Secondary | ICD-10-CM | POA: Insufficient documentation

## 2024-05-31 DIAGNOSIS — Z79899 Other long term (current) drug therapy: Secondary | ICD-10-CM | POA: Diagnosis not present

## 2024-05-31 DIAGNOSIS — Z91040 Latex allergy status: Secondary | ICD-10-CM | POA: Diagnosis not present

## 2024-05-31 DIAGNOSIS — T7840XA Allergy, unspecified, initial encounter: Secondary | ICD-10-CM | POA: Diagnosis not present

## 2024-05-31 DIAGNOSIS — Z91010 Allergy to peanuts: Secondary | ICD-10-CM | POA: Diagnosis not present

## 2024-05-31 DIAGNOSIS — I10 Essential (primary) hypertension: Secondary | ICD-10-CM | POA: Diagnosis not present

## 2024-05-31 DIAGNOSIS — R220 Localized swelling, mass and lump, head: Secondary | ICD-10-CM | POA: Diagnosis present

## 2024-05-31 MED ORDER — METOPROLOL TARTRATE 5 MG/5ML IV SOLN
5.0000 mg | Freq: Once | INTRAVENOUS | Status: AC
  Administered 2024-05-31 (×2): 5 mg via INTRAVENOUS
  Filled 2024-05-31: qty 5

## 2024-05-31 MED ORDER — FAMOTIDINE IN NACL 20-0.9 MG/50ML-% IV SOLN
20.0000 mg | Freq: Once | INTRAVENOUS | Status: AC
  Administered 2024-05-31 (×2): 20 mg via INTRAVENOUS
  Filled 2024-05-31: qty 50

## 2024-05-31 MED ORDER — CETIRIZINE HCL 5 MG/5ML PO SOLN
10.0000 mg | Freq: Once | ORAL | Status: AC
  Administered 2024-05-31 (×2): 10 mg via ORAL
  Filled 2024-05-31: qty 10

## 2024-05-31 MED ORDER — HYDROCHLOROTHIAZIDE 25 MG PO TABS
25.0000 mg | ORAL_TABLET | Freq: Once | ORAL | Status: AC
  Administered 2024-05-31 (×2): 25 mg via ORAL
  Filled 2024-05-31: qty 1

## 2024-05-31 MED ORDER — PREDNISONE 10 MG PO TABS: ORAL_TABLET | ORAL | 0 refills | Status: AC

## 2024-05-31 MED ORDER — METHYLPREDNISOLONE SODIUM SUCC 125 MG IJ SOLR
125.0000 mg | Freq: Once | INTRAMUSCULAR | Status: AC
  Administered 2024-05-31 (×2): 125 mg via INTRAVENOUS
  Filled 2024-05-31: qty 2

## 2024-05-31 NOTE — ED Provider Notes (Signed)
 [place] [facility] Provider Note   CSN: [id number] Arrival date & time: 05/31/24  1150     Patient presents with: Facial Swelling   Lauren Paul is a 58 y.o. female who presents with concern for swelling around her right eye that she started noticing this morning.  States she was stung by a bee underneath her right eye 3 days ago. Reports some nausea. Denies shortness of breath or difficulty breathing, rashes, dizziness, vomiting.  She took Tylenol  prior to arrival for the pain.  Has not taken any other medications for her symptoms.  Does report she did not take her metoprolol  and hydrochlorothiazide  this morning.    HPI     Prior to Admission medications   Medication Sig Start Date End Date Taking? Authorizing Provider  predniSONE  (DELTASONE ) 10 MG tablet Take 4 tablets (40 mg total) by mouth daily with breakfast for 1 day, THEN 3 tablets (30 mg total) daily with breakfast for 2 days, THEN 2 tablets (20 mg total) daily with breakfast for 2 days, THEN 1 tablet (10 mg total) daily with breakfast for 1 day. 06/01/24 06/07/24 Yes Veta Palma, PA-C  aspirin  EC 81 MG tablet Take 1 tablet (81 mg total) by mouth 2 (two) times daily. 01/12/24   Orlando Camellia POUR, PA-C  Camphor-Menthol-Capsicum (TIGER BALM PAIN RELIEVING) 110-33-22 MG PT[facility] Place 1 patch onto the skin daily as needed (pain).    [provider]  EPINEPHrine  (EPIPEN  2-PAK) 0.3 mg/0.3 mL IJ SOAJ injection Inject 0.3 mLs (0.3 mg total) into the muscle as needed for anaphylaxis. Use as directed for severe allergic reaction 04/14/20   Kip Ade, NP  escitalopram  (LEXAPRO ) 20 MG tablet Take 1 tablet (20 mg total) by mouth daily. 03/27/20   Melonie Tori Mikel CHRISTELLA, MD  hydrochlorothiazide  (HYDRODIURIL ) 12.5 MG tablet Take 1 tablet (12.5 mg total) by mouth daily. 03/27/20   Melonie Tori Mikel CHRISTELLA, MD  metoprolol  tartrate (LOPRESSOR ) 25 MG tablet Take 25 mg by mouth daily as needed (heart rate  above 60).    [provider]  Multiple Vitamins-Minerals (SOLO) TABS Take 1 tablet by mouth daily.    [provider]  oxyCODONE -acetaminophen  (PERCOCET/ROXICET) 5-325 MG tablet Take 1 tablet by mouth every 4 (four) hours as needed for severe pain (pain score 7-10). 01/12/24   Orlando Camellia POUR, PA-C  spironolactone (ALDACTONE) 25 MG tablet Take 25 mg by mouth daily. 10/16/21   [provider]  tiZANidine  (ZANAFLEX ) 2 MG tablet Take 1 tablet (2 mg total) by mouth every 6 (six) hours as needed for muscle spasms. 01/12/24   Orlando Camellia POUR, PA-C  traMADol (ULTRAM) 50 MG tablet Take 50 mg by mouth every 6 (six) hours as needed.    [provider]    Allergies: Bee venom, Benadryl [diphenhydramine hcl], Cantaloupe extract allergy skin test, Citrullus vulgaris, Diphenhydramine hcl, Diphenhydramine hcl, Doxycycline , Latex, Peanut (diagnostic), Penicillins, Apap-pamabrom-pyrilamine, Betadine [povidone iodine], Iodides, Losartan , Olmesartan , Other, Shellfish allergy, Sulfa antibiotics, and Valsartan     Review of Systems  Skin:        Right eyelid edema    Updated Vital Signs BP (!) 187/108   Pulse (!) 56   Ht 4' 11 (1.499 m)   Wt 70.3 kg   SpO2 100%   BMI 31.31 kg/m   Physical Exam Vitals and nursing note reviewed.  Constitutional:      General: She is not in acute distress.    Appearance: She is well-developed.  HENT:     Head: Normocephalic and atraumatic.     Comments: Continue with the right upper and lower eyelid.  Right eyelid not completely swollen closed    Mouth/Throat:     Comments: No edema of the lips, tongue, posterior oropharynx.  Swallowing secretions without difficulty. Eyes:     Extraocular Movements: Extraocular movements intact.     Conjunctiva/sclera: Conjunctivae normal.     Pupils: Pupils are equal, round, and reactive to light.  Cardiovascular:     Rate and Rhythm: Normal rate and regular rhythm.     Heart sounds: No murmur  heard. Pulmonary:     Effort: Pulmonary effort is normal. No respiratory distress.     Breath sounds: Normal breath sounds.     Comments: Talking in full sentences on room air without difficulty.  Lung sounds clear and present bilaterally. No stridor Abdominal:     Palpations: Abdomen is soft.     Tenderness: There is no abdominal tenderness.  Musculoskeletal:        General: No swelling.     Cervical back: Neck supple.  Skin:    General: Skin is warm and dry.     Capillary Refill: Capillary refill takes less than 2 seconds.     Comments: No rashes  Neurological:     Mental Status: She is alert.  Psychiatric:        Mood and Affect: Mood normal.     (all labs ordered are listed, but only abnormal results are displayed) Labs Reviewed - No data to display  EKG: None  Radiology: No results found.   Procedures   Medications Ordered in the ED  methylPREDNISolone  sodium succinate (SOLU-MEDROL ) 125 mg/2 mL injection 125 mg (125 mg Intravenous Given 05/31/24 1414)  famotidine  (PEPCID ) IVPB 20 mg premix (0 mg Intravenous Stopped 05/31/24 1453)  cetirizine  HCl (Zyrtec ) 5 MG/5ML solution 10 mg (10 mg Oral Given 05/31/24 1413)  metoprolol  tartrate (LOPRESSOR ) injection 5 mg (5 mg Intravenous Given 05/31/24 1414)  hydrochlorothiazide  (HYDRODIURIL ) tablet 25 mg (25 mg Oral Given 05/31/24 1413)    Clinical Course as of 05/31/24 1519  Mon May 31, 2024  1355 Evaluated patient. Right upper eyelid with edema, no edema of lips, tongue, or posterior pharynx.  Is on secretions without difficulty.  No difficulty breathing.  No stridor.  No rash.  No hypertension.  No signs of anaphylaxis at this time. [AF]  1403 BP(!): 219/116 BP elevated, will give home hydrochlorothiazide  and metoprolol  [AF]  1441 Reevaluated patient, right eye swelling has improved.  Reports nausea has not improved, but declines any nausea medication at this time.  Blood pressure improving, now 192/110.  [AF]  1514 Reassessed  patient, she is feeling much better.  Right eyelid edema improved to home.  No further signs of allergic reaction. [AF]    Clinical Course User Index [AF] Veta Palma, PA-C                                 Medical Decision Making Risk Prescription drug management.     Differential diagnosis includes but is not limited to allergic reaction, anaphylaxis, hypertension, hypertensive emergency, angioedema  ED Course:  Upon initial evaluation, patient is well-appearing, no acute distress.  Stable vitals aside from her elevated blood pressure at 219/116 upon arrival.  She does report she is having some pain around her right eye where the swelling is and thinks this is driving  her blood pressure up. She also did not take her home medications HCTZ and metoprolol .  Will give her her home HCTZ and metoprolol  here.  She has concern for swelling around her right upper and lower eyelid.  Reports she was stung by a bee here 3 days ago, but swelling just started this morning.  Reporting some nausea but no vomiting.  No other signs of anaphylaxis such as hypotension, rash, difficulty breathing.  Will start with methylprednisolone , famotidine , and cetirizine .   Medications Given: Famotidine  Methylprednisolone  Cetirizine  Metoprolol  Hydrochlorothiazide   Upon re-evaluation, patient's right eye edema has improved. She still remains without any further signs of allergic reaction such as rash, hypotension, vomiting, or difficulty breathing.  I confirmed with patient that she does have an EpiPen  available for use at home if she starts to develop any signs of anaphylaxis.  She knows how to use her EpiPen .  Her blood pressure was elevated, and, but improved with her home medications of metoprolol  and hydrochlorothiazide .  Still remains elevated at 187/108.  She denies any headache, abdominal pain, low concern for hypertensive emergency at this time.  He will have her follow-up with her cardiologist to further  address her blood pressures.  Stable and appropriate for discharge home at this time    Impression: Allergic reaction  Disposition:  The patient was discharged home with instructions to take prednisone  taper as prescribed.  Follow-up with her cardiologist within the next week to address her blood pressures.  Continue taking her home spironolactone, hydrochlorothiazide , and metoprolol  as prescribed. Return precautions given.    This chart was dictated using voice recognition software, Dragon. Despite the best efforts of this provider to proofread and correct errors, errors may still occur which can change documentation meaning.       Final diagnoses:  Allergic reaction, initial encounter  Hypertension, unspecified type    ED Discharge Orders          Ordered    predniSONE  (DELTASONE ) 10 MG tablet  Q breakfast        05/31/24 1516               Veta Palma, PA-C 05/31/24 1519    Jerrol Agent, MD 05/31/24 1623

## 2024-05-31 NOTE — ED Notes (Signed)
 Swelling improved to R eye, pt states vision improved.

## 2024-05-31 NOTE — ED Triage Notes (Signed)
 Pt c/o R eye swelling since being stung by a bee on Saturday afternoon. Reports sting was below R eye, but new swelling noted this AM to top R eyelid.   C/o nausea, headache on R side, throat tightness.  Respirations equal and unlabored, speaking in full sentences.

## 2024-05-31 NOTE — Discharge Instructions (Addendum)
 You were treated here for your allergic reaction.  You have been prescribed prednisone  to help treat your allergic reaction. Please take this medication as prescribed for the next 7 days (40mg  on days 1 and 2, 30mg  on days 3 and 4, 20mg  on days 5 and 6, 10mg  on day 7).  You were given your first dose here today.  Take your next dose tomorrow morning.  Take this medication in the morning with breakfast, as taking it at night may make it hard to sleep. If you are a diabetic, please monitor your blood sugars closely on this medication, as it can cause your blood sugar to rise. If your blood sugars rise above 400, stop taking the medication and go to the ER.   You may take 10 mg of cetirizine  (Zyrtec ) daily to help with allergic symptoms.  You were given your first dose here today.  Please follow-up with your cardiologist within the next week to discuss your blood pressures.  Your blood pressure was elevated here 187/108.  Please return to the ER for any difficulty breathing, rash, facial swelling, any other new or concerning symptoms
